# Patient Record
Sex: Female | Born: 1935 | Race: White | Hispanic: No | Smoking: Former smoker
Health system: Southern US, Community
[De-identification: ages and names within clinical notes are randomized; demographics above are authoritative.]

## PROBLEM LIST (undated history)

## (undated) ENCOUNTER — Ambulatory Visit: Admission: EM | Payer: Medicare Other | Source: Home / Self Care

## (undated) DIAGNOSIS — I10 Essential (primary) hypertension: Secondary | ICD-10-CM

## (undated) DIAGNOSIS — E079 Disorder of thyroid, unspecified: Secondary | ICD-10-CM

## (undated) DIAGNOSIS — IMO0001 Reserved for inherently not codable concepts without codable children: Secondary | ICD-10-CM

## (undated) DIAGNOSIS — E039 Hypothyroidism, unspecified: Secondary | ICD-10-CM

## (undated) DIAGNOSIS — K219 Gastro-esophageal reflux disease without esophagitis: Secondary | ICD-10-CM

## (undated) DIAGNOSIS — I480 Paroxysmal atrial fibrillation: Secondary | ICD-10-CM

## (undated) DIAGNOSIS — Z8679 Personal history of other diseases of the circulatory system: Secondary | ICD-10-CM

## (undated) DIAGNOSIS — I2510 Atherosclerotic heart disease of native coronary artery without angina pectoris: Secondary | ICD-10-CM

## (undated) DIAGNOSIS — I509 Heart failure, unspecified: Secondary | ICD-10-CM

## (undated) DIAGNOSIS — I429 Cardiomyopathy, unspecified: Secondary | ICD-10-CM

## (undated) HISTORY — PX: PACEMAKER PLACEMENT: SHX43

## (undated) HISTORY — PX: CHOLECYSTECTOMY: SHX55

## (undated) HISTORY — PX: KNEE ARTHROSCOPY: SUR90

## (undated) HISTORY — PX: APPENDECTOMY: SHX54

---

## 2000-08-15 ENCOUNTER — Other Ambulatory Visit: Admission: RE | Admit: 2000-08-15 | Discharge: 2000-08-15 | Payer: Self-pay | Admitting: *Deleted

## 2000-08-15 ENCOUNTER — Ambulatory Visit (HOSPITAL_COMMUNITY): Admission: RE | Admit: 2000-08-15 | Discharge: 2000-08-15 | Payer: Self-pay | Admitting: *Deleted

## 2000-08-15 ENCOUNTER — Encounter: Payer: Self-pay | Admitting: *Deleted

## 2000-11-20 ENCOUNTER — Ambulatory Visit (HOSPITAL_COMMUNITY): Admission: RE | Admit: 2000-11-20 | Discharge: 2000-11-20 | Payer: Self-pay | Admitting: Family Medicine

## 2000-11-20 ENCOUNTER — Encounter: Payer: Self-pay | Admitting: Family Medicine

## 2001-05-22 ENCOUNTER — Ambulatory Visit (HOSPITAL_COMMUNITY): Admission: RE | Admit: 2001-05-22 | Discharge: 2001-05-22 | Payer: Self-pay | Admitting: Family Medicine

## 2001-05-22 ENCOUNTER — Encounter: Payer: Self-pay | Admitting: Family Medicine

## 2001-09-19 ENCOUNTER — Encounter: Payer: Self-pay | Admitting: Family Medicine

## 2001-09-19 ENCOUNTER — Ambulatory Visit (HOSPITAL_COMMUNITY): Admission: RE | Admit: 2001-09-19 | Discharge: 2001-09-19 | Payer: Self-pay | Admitting: Family Medicine

## 2001-11-22 ENCOUNTER — Encounter: Payer: Self-pay | Admitting: *Deleted

## 2001-11-22 ENCOUNTER — Inpatient Hospital Stay (HOSPITAL_COMMUNITY): Admission: AD | Admit: 2001-11-22 | Discharge: 2001-11-24 | Payer: Self-pay | Admitting: *Deleted

## 2001-11-24 ENCOUNTER — Encounter: Payer: Self-pay | Admitting: *Deleted

## 2001-12-17 ENCOUNTER — Ambulatory Visit (HOSPITAL_COMMUNITY): Admission: RE | Admit: 2001-12-17 | Discharge: 2001-12-17 | Payer: Self-pay | Admitting: Family Medicine

## 2001-12-17 ENCOUNTER — Encounter: Payer: Self-pay | Admitting: Family Medicine

## 2002-02-12 ENCOUNTER — Ambulatory Visit (HOSPITAL_COMMUNITY): Admission: RE | Admit: 2002-02-12 | Discharge: 2002-02-12 | Payer: Self-pay | Admitting: General Surgery

## 2002-06-07 ENCOUNTER — Encounter: Payer: Self-pay | Admitting: Family Medicine

## 2002-06-07 ENCOUNTER — Ambulatory Visit (HOSPITAL_COMMUNITY): Admission: RE | Admit: 2002-06-07 | Discharge: 2002-06-07 | Payer: Self-pay | Admitting: Family Medicine

## 2002-06-11 ENCOUNTER — Encounter: Payer: Self-pay | Admitting: Family Medicine

## 2002-06-11 ENCOUNTER — Ambulatory Visit (HOSPITAL_COMMUNITY): Admission: RE | Admit: 2002-06-11 | Discharge: 2002-06-11 | Payer: Self-pay | Admitting: Family Medicine

## 2002-09-10 ENCOUNTER — Encounter: Payer: Self-pay | Admitting: General Surgery

## 2002-09-10 ENCOUNTER — Ambulatory Visit (HOSPITAL_COMMUNITY): Admission: RE | Admit: 2002-09-10 | Discharge: 2002-09-10 | Payer: Self-pay | Admitting: General Surgery

## 2002-12-24 ENCOUNTER — Ambulatory Visit (HOSPITAL_COMMUNITY): Admission: RE | Admit: 2002-12-24 | Discharge: 2002-12-24 | Payer: Self-pay | Admitting: Family Medicine

## 2002-12-24 ENCOUNTER — Encounter: Payer: Self-pay | Admitting: Family Medicine

## 2003-10-07 ENCOUNTER — Ambulatory Visit (HOSPITAL_COMMUNITY): Admission: RE | Admit: 2003-10-07 | Discharge: 2003-10-07 | Payer: Self-pay | Admitting: Family Medicine

## 2003-12-31 ENCOUNTER — Ambulatory Visit (HOSPITAL_COMMUNITY): Admission: RE | Admit: 2003-12-31 | Discharge: 2003-12-31 | Payer: Self-pay | Admitting: Family Medicine

## 2004-01-07 ENCOUNTER — Ambulatory Visit (HOSPITAL_COMMUNITY): Admission: RE | Admit: 2004-01-07 | Discharge: 2004-01-07 | Payer: Self-pay | Admitting: Family Medicine

## 2005-02-08 ENCOUNTER — Ambulatory Visit (HOSPITAL_COMMUNITY): Admission: RE | Admit: 2005-02-08 | Discharge: 2005-02-08 | Payer: Self-pay | Admitting: Family Medicine

## 2005-06-04 ENCOUNTER — Inpatient Hospital Stay (HOSPITAL_COMMUNITY): Admission: EM | Admit: 2005-06-04 | Discharge: 2005-06-11 | Payer: Self-pay | Admitting: Emergency Medicine

## 2005-06-22 ENCOUNTER — Ambulatory Visit (HOSPITAL_COMMUNITY): Admission: RE | Admit: 2005-06-22 | Discharge: 2005-06-22 | Payer: Self-pay | Admitting: Family Medicine

## 2005-07-25 ENCOUNTER — Ambulatory Visit (HOSPITAL_COMMUNITY): Admission: RE | Admit: 2005-07-25 | Discharge: 2005-07-26 | Payer: Self-pay | Admitting: *Deleted

## 2005-07-25 HISTORY — PX: CARDIAC CATHETERIZATION: SHX172

## 2006-02-21 ENCOUNTER — Ambulatory Visit (HOSPITAL_COMMUNITY): Admission: RE | Admit: 2006-02-21 | Discharge: 2006-02-21 | Payer: Self-pay | Admitting: Family Medicine

## 2006-02-28 ENCOUNTER — Ambulatory Visit (HOSPITAL_COMMUNITY): Admission: RE | Admit: 2006-02-28 | Discharge: 2006-02-28 | Payer: Self-pay | Admitting: Family Medicine

## 2006-12-11 ENCOUNTER — Ambulatory Visit (HOSPITAL_COMMUNITY): Admission: RE | Admit: 2006-12-11 | Discharge: 2006-12-11 | Payer: Self-pay | Admitting: Family Medicine

## 2007-04-10 ENCOUNTER — Ambulatory Visit (HOSPITAL_COMMUNITY): Admission: RE | Admit: 2007-04-10 | Discharge: 2007-04-10 | Payer: Self-pay | Admitting: General Surgery

## 2008-11-06 HISTORY — PX: NM MYOVIEW LTD: HXRAD82

## 2008-11-25 ENCOUNTER — Ambulatory Visit (HOSPITAL_COMMUNITY): Admission: RE | Admit: 2008-11-25 | Discharge: 2008-11-25 | Payer: Self-pay | Admitting: Family Medicine

## 2009-10-23 ENCOUNTER — Ambulatory Visit (HOSPITAL_COMMUNITY): Admission: RE | Admit: 2009-10-23 | Discharge: 2009-10-23 | Payer: Self-pay | Admitting: Family Medicine

## 2009-11-12 ENCOUNTER — Ambulatory Visit (HOSPITAL_COMMUNITY): Admission: RE | Admit: 2009-11-12 | Discharge: 2009-11-12 | Payer: Self-pay | Admitting: Cardiovascular Disease

## 2010-06-10 LAB — SURGICAL PCR SCREEN
MRSA, PCR: NEGATIVE
Staphylococcus aureus: NEGATIVE

## 2010-08-10 NOTE — H&P (Signed)
NAME:  [name], [name]                 ACCOUNT NO.:  [id number]   MEDICAL RECORD NO.:  [id number]           PATIENT TYPE:  AMB   LOCATION:  DAY                           FACILITY:  [facility]   PHYSICIAN:  Dalia Heading, M.D.  DATE OF BIRTH:  05-Dec-1924   DATE OF ADMISSION:  DATE OF DISCHARGE:  LH                              HISTORY & PHYSICAL   CHIEF COMPLAINT:  Family history of colon carcinoma.   HISTORY OF PRESENT ILLNESS:  The patient is an 77 year old white female  who is referred for endoscopic evaluation.  She needs a colonoscopy due  to a family history of colon carcinoma.  No abdominal pain, weight loss,  nausea, vomiting, diarrhea, constipation, melena, hematochezia have been  noted. She last had a colonoscopy in 2003.   PAST MEDICAL HISTORY:  1. Coronary artery disease.  2. Hypertension.  3. Hypothyroidism.   PAST SURGICAL HISTORY:  1. Appendectomy.  2. Cholecystectomy.  3. Pacemaker placement.   MEDICATIONS:  Amiodarone, Zestril, Lasix,  digoxin, Vytorin, Toprol,  Synthroid.   ALLERGIES:  No known drug allergies.   REVIEW OF SYSTEMS:  Noncontributory.   PHYSICAL EXAMINATION:  GENERAL:  The patient is a well-developed, well-  nourished white female in no acute distress.  LUNGS:  Clear to auscultation with equal breath sounds bilaterally.  HEART:  Examination reveals regular rate and rhythm without S3, S4,  murmurs.  ABDOMEN:  Soft, nontender, nondistended.  No hepatosplenomegaly or  masses are noted.  RECTAL:  Examination deferred to the procedure.   IMPRESSION:  Family history of colon carcinoma.   PLAN:  The patient is scheduled for a colonoscopy on April 10, 2007.  The risks and benefits of the procedure including bleeding and  perforation were fully explained to the patient who gave informed  consent.      Dalia Heading, M.D.  Electronically Signed     MAJ/MEDQ  D:  03/13/2007  T:  03/14/2007  Job:  [id number]   cc:   [facility]  Fax:  [phone]   Corrie Mckusick, M.D.  Fax: [phone]

## 2010-08-13 NOTE — H&P (Signed)
[name], [name]                 ACCOUNT NO.:  [id number]   MEDICAL RECORD NO.:  [id number]          PATIENT TYPE:  INP   LOCATION:  A225                          FACILITY:  [facility]   PHYSICIAN:  [name], M.D.    DATE OF BIRTH:  05-12-1924   DATE OF ADMISSION:  06/04/2005  DATE OF DISCHARGE:  LH                                HISTORY & PHYSICAL   CHIEF COMPLAINT:  Cough and weakness.   HISTORY OF PRESENT ILLNESS:  This is a 77 year old female who has a history  of high-grade AV block and is status post pacemaker placement in 2003.  She  also has hypothyroidism which is compensated, hypertension, and  hyperlipidemia.  She is status post appendectomy, cholecystectomy, and knee  surgery. A very remote history of blood clots, history vague.   The patient has maintained a very active life style.  She was on a trip to  [place] when she developed weakness and cough approximately 3 days prior to  this admission.  She was unable to seek medical care.  She arrived in [place] early this morning and came directly to the hospital for evaluation  of cough and weakness.   In the ER the patient was found to have what appears to be right lower lobe  pneumonia.  She did not have an associated leukocytosis.  She is also find  to be mildly anemic with a hemoglobin of 10.8 and a hematocrit of 32.1.  Differential was shifted to the left.  Chemistries were normal except for  mildly elevated glucose of 140.  Creatinine 1.3 (baseline unknown).   Upon further questioning the patient states that she has experienced some  fever and chills over the past 24-48 hours.  She has had no syncope,  headache, or neurologic deficits; nausea, vomiting, diarrhea, melena,  hematemesis, or hematochezia. She also denies any genitourinary symptoms.   The patient was admitted with apparent pneumonia though it is somewhat  atypical on presentation.   CURRENT MEDICATIONS:  1.  Toprol XL 100 mg daily.  2.   Amiodarone 100 mg daily.  3.  Clarinex p.r.n.  4.  Synthroid 125 mcg daily.  5.  Vytorin 10/20 daily.   ALLERGIES:  MACRODANTIN and SULFA.   PAST HISTORY:  As noted above.   FAMILY HISTORY:  Significant for colon cancer in 2 sisters.   SOCIAL HISTORY:  Nonsmoker, nondrinker.   REVIEW OF SYSTEMS:  Negative except as mentioned.   PHYSICAL EXAMINATION:  GENERAL:  This is a very pleasant, female who is  somewhat dyspneic at rest.  VITAL SIGNS:  At presentation, temperature 101.1 degrees, pulse 103,  respirations 24.  O2 saturation 93% on 2 liters.  HEENT:  Normocephalic, atraumatic.  Pupils are equal.  She is mildly  dehydrated.  There is no scleral icterus.  Ears, nose, and throat are  benign.  NECK:  Supple without bruits, thyromegaly, or lymphadenopathy.  LUNGS:  Reveal scattered rales and rhonchi particularly in the right base.  There is also some mild expiratory wheezing present.  HEART:  Rhythm is regular though  she is mildly tachycardic with a rate of  103.  ABDOMEN:  Nontender, nondistended. There are no masses or organomegaly  noted.  EXTREMITIES:  No clubbing or cyanosis noted.  Homan's sign is negative  bilaterally.  Peripheral pulses are intact.  NEUROLOGIC EXAM:  Nonfocal.   ASSESSMENT:  Apparent pneumonia though it is somewhat atypical.  She is  lacking leukocytosis for significant sputum production. She does have a  history of thrombotic phenomenon in the distant past and has recently been  traveling which puts her at risk for venous thrombosis. She also has anemia  which is mild currently.  She has been screened with coloscopy in 2003.   Of note, there is a family history of gastric carcinoma as well.   PLAN:  Admit for broad spectrum antibiotics, pulmonary toilet, D-dimer and  CT scanning, if elevated. We will follow and treat expectantly.  Consider GI  consult pending her progress.      [name], M.D.  Electronically Signed     MC/MEDQ  D:   06/05/2005  T:  06/06/2005  Job:  [id number]

## 2010-08-13 NOTE — Cardiovascular Report (Signed)
NAME:  [name], [name]                           ACCOUNT NO.:  [id number]   MEDICAL RECORD NO.:  [id number]                   PATIENT TYPE:  INP   LOCATION:  2910                                 FACILITY:  [facility]   PHYSICIAN:  Darlin Priestly, M.D.             DATE OF BIRTH:  1924-10-09   DATE OF PROCEDURE:  11/23/2001  DATE OF DISCHARGE:  11/24/2001                              CARDIAC CATHETERIZATION   PROCEDURE:  1. Left heart catheterization.  2. Coronary angiography.  3. Left ventriculogram.   ATTENDING FOR PROCEDURE:  Darlin Priestly, M.D.   COMPLICATIONS:  None.   INDICATIONS FOR PROCEDURE:  The patient is a 77 year old female patient of  Dr. Assunta Found in [place], initially referred for a treadmill  Cardiolite secondary to chest pain.  During the stress portion of her stress  test, the patient developed second-degree Type II AV block and her stress  test was subsequently discontinued.  She was subsequently admitted for rule  out MI and cardiac catheterization with probable permanent pacemaker  placement.   DESCRIPTION OF PROCEDURE:  After giving informed written consent, the  patient was brought to the cardiac catheterization where her right and left  groin were shaved, prepped, and draped in the usual sterile fashion.  ECG  monitoring was established.  Using the modified Seldinger technique, a #6  French arterial sheath was inserted in the right femoral artery.  The #6  [place] diagnostic catheters were then used to perform diagnostic  angiography.   FINDINGS:  1. This reveals a large left main with no significant disease.  2. The LAD is a medium-sized vessel which coursed to the apex.  There were     two small diagonal branches.  The LAD is noted to be calcified in its     proximal segment with 40% proximal disease.  There is a 60% mid LAD     stenotic lesion with course irregularities throughout the mid and distal     LAD.  The first and second diagonal  branches are small vessels that were     irregular but no significant disease.  3. The AV groove circumflex is a medium-sized vessel which coursed in the AV     groove and gave rise to one large obtuse marginal branch.  The AV groove     circumflex is noted to have a 40% proximal lesion.  The first OM is a     large vessel which bifurcates in the mid segment and has sequential 50%     and 40% proximal to mid vessel lesions.  4. The right coronary artery is a medium-sized vessel which is dominant and     gives rise to a PDA as well as a posterolateral branch.  The RCA is noted     to have a 60% long proximal, 50% mid, and 50% distal stenotic lesions.     The  PDA and posterolateral branch have no significant disease.  5. Left ventriculogram reveals a __________ EF of 50% with bradycardia     noted.   HEMODYNAMICS:  1. System arterial pressure 160/50.  2. LV systemic pressure 160/18, LVEDP of 22.    CONCLUSION:  1. Noncritical coronary artery disease.  2. Normal left ventricular systolic function.                                               Darlin Priestly, M.D.    RHM/MEDQ  D:  11/23/2001  T:  11/25/2001  Job:  [id number]   cc:   Corrie Mckusick, M.D.  [address] [place]. [place]  [place]  [place] [address]  Fax: [phone]

## 2010-08-13 NOTE — Discharge Summary (Signed)
NAME:  [name], [name] NO.:  [id number]   MEDICAL RECORD NO.:  [id number]          PATIENT TYPE:  OIB   LOCATION:  3731                         FACILITY:  [facility]   PHYSICIAN:  Delman Cheadle, MD       DATE OF BIRTH:  [birth year]   DATE OF ADMISSION:  07/25/2005  DATE OF DISCHARGE:  07/26/2005                                 DISCHARGE SUMMARY   Ms. [name] is a 77 year old female patient of Dr. Martyn Malay who  has a history of diffuse non critical coronary disease and normal EF. She  apparently recently had been hospitalized at [facility]. She also has known  Paroxysmal atrial fibrillation with sick sinus syndrome and underwent a  pacemaker implantation on November 23, 2001, also the initiation of  amiodarone. She has been maintaining sinus rhythm. She apparently had been  admitted June 06, 2005 at [facility] for progressive shortness of breath.  She had a 2D echocardiogram and her ejection fraction was found to be 25%.  She did have a small troponin leak, with normal CK-MB. She was seen by Dr.  Jenne Campus in the office on July 19, 2005. The pacemaker was interrogated, she  did have some episodes of atrial fibrillation with rapid ventricular  response. Her Toprol was increased to 75 mg every day. It was decided that  she should undergo cardiac catheterization to see if she had progressive  coronary disease causing her EF to be decreased. This was performed on July 25, 2005 by Dr. Lenise Herald. She was found to have diffuse coronary  disease and medical therapy was recommended. No real high-grade stenosis for  intervention. Her EF was 30-35%. Cardiac output was 2.8. Cardiac index was  1.7 PA. Pressures were 31/12, her wedge was 12. PA saturation was 59%.  On  the morning of Jul 26, 2005 she was seen by Dr. Nicki Guadalajara and considered  stable for discharge home. Dr. Jenne Campus had ordered digoxin. The patient had  told me this had been stopped recently by Dr. Renette Butters  because of elevated  level. However, the patient states that she did take her digoxin the morning  that she had her blood drawn.   DISCHARGE MEDICATIONS:  1.  Lisinopril 20 mg daily.  2.  Levothyroxine 150 mcg daily.  3.  Toprol XL 75 mg daily.  4.  Vytorin 10/20 mg daily.  5.  Lasix 40 mg daily.  6.  Amiodarone 200 mg daily.  7.  Aspirin 81 mg daily.  8.  Claritin p.r.n.  9.  Digoxin 0.125 mg daily.  10. Nexium 40 mg daily.  11. Multivitamin one daily.   FOLLOW UP:  She will follow up with Dr. Jenne Campus next Monday as already  planned. She will have a blood drawn on Monday after seeing Dr. Jenne Campus for  her BMET and a digoxin level. She should not take digoxin the morning of  having her blood drawn.   DISCHARGE DIAGNOSES:  1.  Recent onset of shortness of breath.  2.  Non ischemic cardiomyopathy with an ejection fraction now of  30-35% by      cardiac catheterization.  3.  Coronary artery disease, diffuse. There is no high-grade stenosis      however she has coronary disease in three of her major vessels.  4.  Hyperlipidemia.  5.  Hypertension.  6.  Proximal atrial fibrillation on amiodarone and aspirin, recently Toprol      increased secondary to intermittent paroxysmal atrial fibrillation by      pacer interrogation on July 19, 2005 by Dr. Lenise Herald.      Lezlie Octave, N.P.    ______________________________  Delman Cheadle, MD    BB/MEDQ  D:  07/26/2005  T:  07/26/2005  Job:  [id number]   cc:   Dr. Renette Butters

## 2010-08-13 NOTE — Discharge Summary (Signed)
NAME:  [name], [name]                 ACCOUNT NO.:  [id number]   MEDICAL RECORD NO.:  [id number]          PATIENT TYPE:  INP   LOCATION:  A219                          FACILITY:  [facility]   PHYSICIAN:  Patrica Duel, M.D.    DATE OF BIRTH:  06/10/1924   DATE OF ADMISSION:  DATE OF DISCHARGE:  LH                                 DISCHARGE SUMMARY   DISCHARGE DIAGNOSES:  1.  Acute pneumonia with eventually good response to therapy.  2.  Question acute myocardial infarction with slight bump in enzymes.  3.  History of high-grade atrioventricular block, status post pacemaker      placement 2003.  4.  Hypothyroidism (compensated).  5.  Hypertension.  6.  Hyperlipidemia.  7.  Remote history of venous thrombosis (history vague).   PAST SURGICAL HISTORY:  1.  Appendectomy.  2.  Cholecystectomy.  3.  Knee surgery.   DETAILS REGARDING ADMISSION:  Please refer to the admitting note.  Briefly,  this is a 77 year old female with the above-history, was on a trip to [place]  when she developed weakness and cough approximately three days prior to the  admission.  She arrived in [place] early the morning of admission and  came directly to the hospital for evaluation.  She was found to have a right  lower lobe pneumonia and associated leukocytosis.  Hemoglobin 9.8,  hematocrit was 32.1.  Chemistries were normal except for a mildly elevated  glucose 140, creatinine 1.3.   The patient was admitted with apparent pneumonia.   COURSE IN THE HOSPITAL:  The patient underwent CT scanning, which confirmed  the presence of atelectasis and pneumonia.  She remained mildly hypoxic and  dyspneic.  She was transferred to the unit for intensive pulmonary toilet  and ongoing care.  [facility] was consulted after her troponin  increased slightly.  She did not have EKG changes.  The possibility of acute  MI was entertained, and she was put on heparin briefly.   The patient improved over the next 48  hours and was transferred to the floor  for ongoing management.  She continued to do well and was stable for  discharge on the eighth hospital day.   DISPOSITION:   MEDICATIONS:  1.  Amiodarone 100 mg daily.  2.  Synthroid 125 mg daily.  3.  Vytorin 10/20 mg daily.  4.  Clarinex p.r.n.  5.  Toprol-XL 50 mg daily.  6.  Norvasc 5 mg daily.  7.  Lanoxin 0.125 mg daily.  8.  Lasix 40 mg daily.  9.  Xopenex HFA two puffs t.i.d.  10. She is also being continued on Levaquin 500 mg daily.   We will follow her in three weeks as an outpatient.      Patrica Duel, M.D.  Electronically Signed     MC/MEDQ  D:  07/16/2005  T:  07/18/2005  Job:  [id number]

## 2010-08-13 NOTE — Cardiovascular Report (Signed)
NAME:  [name], [name]                 ACCOUNT NO.:  [id number]   MEDICAL RECORD NO.:  [id number]          PATIENT TYPE:  OIB   LOCATION:  3731                         FACILITY:  [facility]   PHYSICIAN:  Darlin Priestly, MD  DATE OF BIRTH:  06-06-1924   DATE OF PROCEDURE:  07/25/2005  DATE OF DISCHARGE:                              CARDIAC CATHETERIZATION   PROCEDURE:  1.  Right heart catheterization.  2.  Left heart catheterization.  3.  Coronary angiography.  4.  Left ventriculogram.   ATTENDING:  Dr. Lenise Herald   COMPLICATIONS:  None.   INDICATIONS:  [name] is a 77 year old female patient of Dr. Dorthey Sawyer  with a history of cardiac catheterization in 2003 with diffuse noncritical  CAD with normal EF.  She has a history of PAF with sick sinus syndrome and  ultimately underwent dual-chamber pacer implant on November 23, 2001, with  initiation of amiodarone at that time.  She has had some brief episodes of  atrial fibrillation but overall has maintained sinus rhythm.  She recently  complained of increasing shortness of breath with repeat echocardiogram  revealing EF now 25%.  She is now for repeat catheterization to reassess her  CAD.   DESCRIPTION OF PROCEDURE:  After obtaining informed consent the patient was  brought to the cardiac catheterization laboratory.  The right groin was  shaved, prepped and draped in the usual sterile fashion.  ECG monitoring was  established.  Using a modified Seldinger technique a #7 [place] venous sheath  was inserted in the right femoral vein and a #6 [place] arterial sheath was  inserted in the right femoral artery.  Next, under fluoroscopic guidance a  #7 Kennieth Rad catheter was inflated to the RA, RVP, and wedge position  and then hemodynamic measures were obtained.  A 6 French diagnostic catheter  was used to perform diagnostic angiography.   The left main is a large vessel with no significant disease.   The LAD is a medium-size  vessel which coursed the apex, gave rise to four  small diagonal branches.  The LAD was noted to have proximal and mid  calcification with 40% proximal and 40% mid LAD stenosis.  The remainder of  the LAD is irregular but has no higher grade stenosis.   All four diagonal branches are small vessels with scattered 60-70% lesions  throughout the second and third diagonals.   The left circumflex is a medium-size vessel which courses the AV groove and  gives rise to obtuse marginal branches.  The circumflex is calcified  throughout the mid portion.  There is mild 30% proximal and 50% mid vessel  AV groove lesions.   The first OM is a medium-size vessel which is tortuous and bifurcates  distally.  There is 40% mid vessel narrowing.   The second OM is a small vessel with no significant disease.   The right coronary artery is a medium-size vessel which is dominant, gives  rise to both PDA as well as posterolateral branch.  There is calcification  noted in the proximal portion of the  RCA.  There is 40% proximal and 40% mid  vessel stenosis.  The PDA and posterolateral branch have no significant  disease.   Left ventriculogram reveals moderate to severely depressed EF of 30-35% with  global hypokinesis.   HEMODYNAMICS:  Right atrial pressure is 6, RV 28/4, PA 31/12, pulmonary cap  wedge pressure is 12, systemic arterial pressure 146/58, LV systolic  pressure 150/13, LVEDP of 20.  Cardiac output 2.8, cardiac index 1.7, PA  saturation 59%, AO saturation 94%.   CONCLUSIONS:  1.  Scattered noncritical coronary artery disease involving small diagonal      branches.  2.  Moderate to severely depressed left ventricular systolic function.  3.  Normal right heart pressures.  4.  Cardiac output 2.8, cardiac index 1.7.  5.  PA saturation 59%, AO saturation 94%.      Darlin Priestly, MD  Electronically Signed     RHM/MEDQ  D:  07/25/2005  T:  07/26/2005  Job:  [id number]   cc:   Corrie Mckusick, M.D.  Fax: [phone]

## 2010-08-13 NOTE — Procedures (Signed)
NAME:  [name], [name]                 ACCOUNT NO.:  [id number]   MEDICAL RECORD NO.:  [id number]          PATIENT TYPE:  INP   LOCATION:  IC08                          FACILITY:  [facility]   PHYSICIAN:  Darlin Priestly, MD  DATE OF BIRTH:  May 16, 1924   DATE OF PROCEDURE:  06/07/2005  DATE OF DISCHARGE:                                  ECHOCARDIOGRAM   INDICATIONS:  Ms. Asencio is a 77 year old female patient of Dr. Nobie Putnam with  a history of atrial fibrillation, hypertension and acute onset of shortness  of breath. She is now referred for a two-day echocardiogram to evaluate LV  function.   This is a technically suboptimal study secondary to poor acoustic windows.   The aorta is within normal limits at 3.0 cm.   Left atrium is within normal limits at 3.0 cm. There were no clots seen.   IVS and LVPW are mildly thickened at 1.2 and 1.3 cm, respectively.   The aortic valve is not well visualized. However, it appears to be at least  moderately calcified. There is leaflet motion visible. By Doppler  interrogation, there does not appear to be any significant aortic stenosis  present.   The mitral valve leaflets were mildly thickened with mild mitral annular  calcification. There is no mitral valve prolapse noted. There is at least  moderate mitral regurgitation.   Tricuspid valve was not well visualized.   Left ventricular internal dimensions were upper limits of normal at 4.8 and  4.0 cm, respectively. There is moderate to severe depressed LV systolic  function, estimated EF of approximately 20 to 25% with global hypokinesis.  There is septic dyskinesis secondary to pacer artifact. Function is best  preserved in the posterolateral distribution.   Grossly normal RV size and systolic function.   CONCLUSION:  1.  This is a technically suboptimal study secondary to poor acoustic      windows.  2.  Mildly dilated left ventricle with moderate to severely depressed left      ventricular  systolic function, estimated ejection fraction of 20 to 25%.      There is global hypokinesis with septal dyskinesis secondary to pacer      artifact. Function is best preserved in the posterolateral distribution.  3.  Moderately thickened aortic valve with no evidence of significant aortic      stenosis.  4.  Mildly thickened mitral valve leaflets with moderate to mitral      regurgitation.  5.  Tricuspid valve poorly visualized.  6.  Grossly normal right ventricular size and systolic function.      Darlin Priestly, MD  Electronically Signed     RHM/MEDQ  D:  06/07/2005  T:  06/08/2005  Job:  [id number]

## 2010-08-13 NOTE — H&P (Signed)
   NAME:  [name], [name]                           ACCOUNT NO.:  [id number]   MEDICAL RECORD NO.:  [id number]                   PATIENT TYPE:   LOCATION:                                       FACILITY:  [facility]   PHYSICIAN:  Dalia Heading, M.D.               DATE OF BIRTH:  1925-01-13   DATE OF ADMISSION:  02/12/2002  DATE OF DISCHARGE:                                HISTORY & PHYSICAL   CHIEF COMPLAINT:  Family history of colon carcinoma.   HISTORY OF PRESENT ILLNESS:  The patient is a 77 year old white female who  is referred for a screening colonoscopy.  She denies any abdominal  complaints.  She has never had a colonoscopy.  She does have a sister with a  history of colon carcinoma.   PAST MEDICAL HISTORY:  1. Sick sinus syndrome requiring pacemaker placement recently.  2. Hypertension.  3. High cholesterol levels.  4. Hypothyroidism.   PAST SURGICAL HISTORY:  1. Appendectomy.  2. Cholecystectomy.  3. Knee surgery.  4. Pacemaker placement.   CURRENT MEDICATIONS:  Zestril, Toprol-XL, Lipitor, and Synthroid.   ALLERGIES:  SULFA and MACRODANTIN.   REVIEW OF SYSTEMS:  Unremarkable.   PHYSICAL EXAMINATION:  GENERAL:  The patient is a well-developed and well-  nourished white female in no acute distress.  VITAL SIGNS:  She is afebrile and vital signs are stable.  LUNGS:  Clear to auscultation with equal breath sounds bilaterally.  HEART:  Reveals a regular rate and rhythm without S3, S4, or murmurs.  ABDOMEN:  Soft, nontender, nondistended.  No hepatosplenomegaly or masses  are noted.  RECTAL:  Deferred to the procedure.   IMPRESSION:  Family history of colon carcinoma.    PLAN:  The patient is scheduled for a colonoscopy on February 12, 2002.  The  risks and benefits of the procedure including bleeding and perforation were  fully explained to the patient, who gave informed consent.  IV antibiotic  prophylaxis will be given due to her pacemaker placement.                                       Dalia Heading, M.D.    MAJ/MEDQ  D:  01/31/2002  T:  01/31/2002  Job:  [id number]   cc:   Corrie Mckusick, M.D.  [address] [place]. [place]  [place]  [place] [address]  Fax: [phone]

## 2010-08-13 NOTE — Discharge Summary (Signed)
NAME:  [name], [name]                           ACCOUNT NO.:  [id number]   MEDICAL RECORD NO.:  [id number]                   PATIENT TYPE:  INP   LOCATION:  2910                                 FACILITY:  [facility]   PHYSICIAN:  Darlin Priestly, M.D.             DATE OF BIRTH:  Aug 12, 1924   DATE OF ADMISSION:  11/22/2001  DATE OF DISCHARGE:  11/24/2001                                 DISCHARGE SUMMARY   DISCHARGE DIAGNOSES:  1. Mobitz 2 and complete heart block.     a. Status post PDD pacemaker.  2. Noncritical coronary disease with 40% to 60% diffuse disease in all     coronary arteries.  3. Hypertension.  4. Hypothyroidism.  5. Dyslipidemia.  6. History of pulmonary embolus.  7. Abnormal Cardiolite study.  8. Chest pain resolved.   CONDITION ON DISCHARGE:  Improved.   PROCEDURES:  1. On November 23, 2001, combined left heart catheterization by Dr. Lenise Herald.  2. On November 23, 2001, insertion of dual chamber Medtronic Kappa KDR [id number]     pacemaker with  passive atrial and ventricular Medtronic leads.   DISCHARGE MEDICATIONS:  1. Prilosec 20 mg 1 q.d.  2. Evista 60 mg q.d.  3. Synthroid 100 mcg q.d.  4. Zestril 20 mg  q.d.  5. Os-Cal 500 plus D 1 q.d.  6. Hydrochlorothiazide 25 mg q.d.  7. Toprol XL 25, 1 q.d.  8. Lipitor 10 mg 1 q.h.s.  9. Centrum vitamin and vitamin E as before.  10.      Papaverine as before.  11.      Coated aspirin 81 mg q.d.   DISCHARGE INSTRUCTIONS:  1. Take extra strength Tylenol if needed for pain at pacemaker site.  2. Do not raise left arm over head for one week, no lifting over 5 pounds     with left arm, no driving for one week.  3. Low fat, low salt diet.  4. Keep wound site dry for one week. The Steri-Strips should fall off on     their own. Call with any bleeding or swelling at the pacer site. In     addition the right groin catheter site, call if any bleeding, swelling or     drainage.  5. Followup with Dr. Jenne Campus in two to  three weeks. The office will call     with an appointment.  6. Followup with one of  Dr. Mikey Bussing assistants the first week of     September.  The office will call you with date and time.   HISTORY OF PRESENT ILLNESS:  The patient is a 77 year old white widowed  female who presented for a nuclear stress test on November 22, 2001, when she  developed Mobitz type 2 heart block and was sent up to see Dr. Jenne Campus for  cardiac evaluation. She had previously not had any cardiac history  until she  had expressed to her primary care physician, Dr. Phillips Odor, that she had had  some chest pain described as an achiness. It was always with exertion, never  at rest, and he had ordered a Cardiolite study.  She also had noticed  increased dyspnea on exertion for the last several months, and she got like  she was panting for breath, going up a slight incline.   PAST MEDICAL HISTORY:  1. Hypertension.  2. Hypothyroidism.  3. History of pulmonary embolus 20  years ago.  4. Status post cholecystectomy.  5. Status post appendectomy.  6. Status post history of arthroscopy of the knee.   OUTPATIENT MEDICATIONS:  1. Zestril 10 q.d.  2. Prilosec 20 q.d.  3. Provera 150 q.d.  4. Synthroid 0.1 mg q.d.  5. Evista 60 q.d.  6. Centrum q.d.  7. Vitamin E and calcium plus D daily.   ALLERGIES:  SULFA AND MACRODANTIN   SOCIAL HISTORY:  Widowed and lives alone.   FAMILY HISTORY:  No premature coronary disease, but she does have family  with coronary disease.   REVIEW OF SYSTEMS:  See history and physical.   PHYSICAL EXAMINATION:  On discharge.  VITAL SIGNS:  Blood pressure 142/58, pulse 92, respirations 18, temperature  97.4, room air O2 saturation 94%.  GENERAL:  An alert, oriented white female in no acute distress.  HEART:  S1, S2, regular rate and rhythm.  LUNGS:  Clear.  CHEST:  Chest wall incision site well approximated. Some bruising but no  hematoma.  ABDOMEN:  Soft, nontender, positive bowel  sounds.  EXTREMITIES:  Without edema.   LABORATORY DATA:  Hemoglobin 11.8, hematocrit 35.8, WBCs 8.8, MCV 92.8,  platelets 220.  Protime 12.8, INR 0.9, PTT 28 on heparin, which was  therapeutic. Chemistry:  Sodium 138, potassium 3.9, chloride 106, CO2 29,  glucose 103, BUN 19, creatinine 0.9, calcium 11.4. CK 82, MB 2.1, troponin  0.02.  All of these  remained less than  100, MB flow and troponin 0.03 at  the peak.  Cholesterol 216, triglycerides 123, HDL 74, LDL 117. T4 10.9, T3  uptake 30.3, TSH 1.53.   The EKGs from the hospital show Mobitz  type 2 block. After admission on  rhythm strips, she continued with type 2 block and then would have long runs  of 2:1 block with heart rates dropping down to the 30s.  At times on her  rhythm strip, she does develop complete heart block with a P wave going into  the QRS.   Cardiac catheterization results:  Diffuse disease of the right coronary  artery with 50% to 60% stenosis, ostial, mid and distal. The left circumflex  and the LAD range from 60% to 40% stenosis. The EF was 50%.   HOSPITAL COURSE:  The patient was admitted originally to a telemetry bed on  4700, after being found to be in Mobitz type 2 block in the office and with  evaluation for chest pain. She was admitted with unstable angina and a  Mobitz 2 heart block. She was stable, although hypertensive. Her medications  were adjusted, and then later on on the day of admission, she developed  slowing of her heart rate, and she was transferred to the transitional care  unit.   She underwent cardiac catheterization on November 23, 2001, and was found to  have diffuse disease. Medical therapy was recommended. The patient also  received a dual chamber pacemaker on November 23, 2001, by Dr. Jenne Campus.  She  tolerated the procedure well.   By the morning of November 24, 2001, she felt a little dizzy when she got up to go have her PA and lateral chest x-ray done, but later that afternoon by  3  o'clock, she was found able to ambulate without any light headedness or  dizziness. The PA and  lateral chest x-ray, the report is not in the chart,  but the called note on November 24, 2001, pacer in place, no pneumothorax.  The patient was discharged on November 24, 2001, and will followup with Dr.  Jenne Campus.     Darcella Gasman. Valarie Merino                     Darlin Priestly, M.D.    LRI/MEDQ  D:  12/10/2001  T:  12/11/2001  Job:  [id number]   cc:   Corrie Mckusick, M.D.  [address] [place]ure[place] Josephs. [address]  [place]  [place] [address]  Fax: [phone]

## 2010-08-13 NOTE — Cardiovascular Report (Signed)
NAME:  [name], [name]                           ACCOUNT NO.:  [id number]   MEDICAL RECORD NO.:  [id number]                   PATIENT TYPE:  INP   LOCATION:  2910                                 FACILITY:  [facility]   PHYSICIAN:  Darlin Priestly, M.D.             DATE OF BIRTH:  [birth year]   DATE OF PROCEDURE:  11/23/2001  DATE OF DISCHARGE:  11/24/2001                              CARDIAC CATHETERIZATION   PROCEDURE:  Placement of dual chamber Medtronic pacemaker, with passive  atrial and ventricular leads.   COMPLICATIONS:  None.   INDICATIONS:  The patient is a 77 year old female, patient of Dr. Geanie Cooley in [place], referred to our office for chest pain and shortness  of breath.  During her treadmill and Cardiolite the patient developed  secondary type 2 AV block and subsequently had her stress test discontinued.  Because of her chest pain, she was subsequently admitted.  She underwent  cardiac catheterization this morning, revealing noncritical disease of her  LAD and RCA, with an EF of approximately 50%.  She is now brought for a dual-  chamber pacemaker placement, secondary to continued second-degree type 2 AV  block.   DESCRIPTION OF PROCEDURE:  After getting informed written consent, the  patient brought to the cardiac catheterization lab -- where left chest  prepped and draped in sterile fashion.  EC line was established.  Lidocaine  1% was then used to anesthetize the left subclavian region approximately 2  cm beneath the left clavicle.  An approximately 3.0-3.5 cm incision was then  performed in horizontal fashion beneath the left clavicle.  Blunt dissection  was then  performed down to the pectoral fascia, and hemostasis obtained  with electrocautery.  An approximately 3 x 4 cm pocket was then created over  the left pectoral fascia.  Hemostasis was obtained.  Next, a single stick  was then used to obtain access into the left subclavian vein, with 1%  lidocaine.  A  guide wire was then easily advanced into the SVC and right  atrium.  Next a #9 [place] dilator and sheath were then advanced over the  guide wire and the guide wire and dilator were then removed.  Next, a 52 cm  Medtronic passive lead (Model [id number], Serial No. [id number]) was then easily  passed into the right atrium.  The guide wire was then reinserted through  the sheath and the sheath was then peeled away.  A second 9-French dilator  sheath was then inserted over the previously placed guide wire.  The dilator  and wire were then removed and a second Medtronic 45 cm passive lead (Model  No 5594, Serial No. [id number]) was then easily passed into the right  atrium.  The guide wire was again retained and the sheath was then pulled  away.  The guide wire was anchored to the sheath with a mosquito hemostat.  A J curve  was then placed on the ventricular lead.  A stylet was then  reinserted in the lead.  The lead was then advanced into the RV outflow  tract.  A straight stylet was then inserted into the mid portion of the  lead, and the lead was then pulled back and allowed to prolapse into the RV  apex.  Thresholds were then determined.  R-waves were measured at 15.2 mV.  Ventricular lead impedance is 851 ohms.  Threshold was 0.4 V at 0.5 msec.  Current was  0.7 milliamps.  There was no evidence of diaphragmatic pacing at 10 V.   Next, the atrial lead was positioned in the right atrial appendage.  Thresholds were then determined.  P-waves were sensed at 6.1 mV.  Impedance  is 615 ohms.  Thresholds of 0.4 V at 0.5 msec.  Current is  0.7 milliamps.  There is no evidence of diaphragmatic stimulation at 10 V.   The retaining guide wire was then removed.  The leads were then anchored to  the pectoral fascia with  four 2-0 silk sutures.  Next, the pocket was copiously irrigated with 1%  kanamycin solution.  Hemostasis was confirmed.  The ventricular and atrial  leads were then connected in serial  fashion to the Medtronic Kappa KDR 801  (Serial No. [id number]) generator, and the head screws were then tightened.  Pacing was confirmed.  A single anchoring silk suture was then placed at the  apex of the pocket.  The generator and wires were then easily delivered into  the pocket.  The pacemaker was then anchored into place.  The fascia was  then closed using running 2-0 Dexon.  The skin layer was then closed using  running 5-0 Dexon.  Steri-Strips then applied.  Sterile dressing was applied  and the patient was transferred to the recovery room in stable condition.   CONCLUSION:  Successful placement of a Medtronic Kappa KDR 801 generator,  with passive atrial and ventricular Medtronic leads.                                               Darlin Priestly, M.D.    RHM/MEDQ  D:  11/23/2001  T:  11/25/2001  Job:  [id number]   cc:   Corrie Mckusick, M.D.  [address] [place]s. [place]  [place]  [place] [address]  Fax: [phone]

## 2011-04-01 DIAGNOSIS — E039 Hypothyroidism, unspecified: Secondary | ICD-10-CM | POA: Diagnosis not present

## 2011-04-01 DIAGNOSIS — I10 Essential (primary) hypertension: Secondary | ICD-10-CM | POA: Diagnosis not present

## 2011-04-01 DIAGNOSIS — E785 Hyperlipidemia, unspecified: Secondary | ICD-10-CM | POA: Diagnosis not present

## 2011-04-05 DIAGNOSIS — J019 Acute sinusitis, unspecified: Secondary | ICD-10-CM | POA: Diagnosis not present

## 2011-04-05 DIAGNOSIS — IMO0002 Reserved for concepts with insufficient information to code with codable children: Secondary | ICD-10-CM | POA: Diagnosis not present

## 2011-04-05 DIAGNOSIS — I10 Essential (primary) hypertension: Secondary | ICD-10-CM | POA: Diagnosis not present

## 2011-04-12 DIAGNOSIS — Z45018 Encounter for adjustment and management of other part of cardiac pacemaker: Secondary | ICD-10-CM | POA: Diagnosis not present

## 2011-04-12 DIAGNOSIS — I4891 Unspecified atrial fibrillation: Secondary | ICD-10-CM | POA: Diagnosis not present

## 2011-04-12 DIAGNOSIS — I2510 Atherosclerotic heart disease of native coronary artery without angina pectoris: Secondary | ICD-10-CM | POA: Diagnosis not present

## 2011-04-12 DIAGNOSIS — I442 Atrioventricular block, complete: Secondary | ICD-10-CM | POA: Diagnosis not present

## 2011-04-19 DIAGNOSIS — I059 Rheumatic mitral valve disease, unspecified: Secondary | ICD-10-CM | POA: Diagnosis not present

## 2011-04-27 DIAGNOSIS — L820 Inflamed seborrheic keratosis: Secondary | ICD-10-CM | POA: Diagnosis not present

## 2011-04-27 DIAGNOSIS — I781 Nevus, non-neoplastic: Secondary | ICD-10-CM | POA: Diagnosis not present

## 2011-04-27 DIAGNOSIS — D235 Other benign neoplasm of skin of trunk: Secondary | ICD-10-CM | POA: Diagnosis not present

## 2011-04-27 DIAGNOSIS — Z85828 Personal history of other malignant neoplasm of skin: Secondary | ICD-10-CM | POA: Diagnosis not present

## 2011-05-13 DIAGNOSIS — J0110 Acute frontal sinusitis, unspecified: Secondary | ICD-10-CM | POA: Diagnosis not present

## 2011-05-13 DIAGNOSIS — J210 Acute bronchiolitis due to respiratory syncytial virus: Secondary | ICD-10-CM | POA: Diagnosis not present

## 2011-05-13 DIAGNOSIS — J069 Acute upper respiratory infection, unspecified: Secondary | ICD-10-CM | POA: Diagnosis not present

## 2011-05-21 ENCOUNTER — Emergency Department (HOSPITAL_COMMUNITY): Payer: Medicare Other

## 2011-05-21 ENCOUNTER — Encounter (HOSPITAL_COMMUNITY): Payer: Self-pay

## 2011-05-21 ENCOUNTER — Emergency Department (HOSPITAL_COMMUNITY)
Admission: EM | Admit: 2011-05-21 | Discharge: 2011-05-21 | Disposition: A | Discharge status: Home / Self Care | Payer: Medicare Other | Attending: Emergency Medicine | Admitting: Emergency Medicine

## 2011-05-21 DIAGNOSIS — Z79899 Other long term (current) drug therapy: Secondary | ICD-10-CM | POA: Diagnosis not present

## 2011-05-21 DIAGNOSIS — M7730 Calcaneal spur, unspecified foot: Secondary | ICD-10-CM | POA: Insufficient documentation

## 2011-05-21 DIAGNOSIS — E079 Disorder of thyroid, unspecified: Secondary | ICD-10-CM | POA: Diagnosis not present

## 2011-05-21 DIAGNOSIS — I10 Essential (primary) hypertension: Secondary | ICD-10-CM | POA: Diagnosis not present

## 2011-05-21 DIAGNOSIS — M79609 Pain in unspecified limb: Secondary | ICD-10-CM | POA: Diagnosis not present

## 2011-05-21 HISTORY — DX: Disorder of thyroid, unspecified: E07.9

## 2011-05-21 HISTORY — DX: Essential (primary) hypertension: I10

## 2011-05-21 MED ORDER — IBUPROFEN 600 MG PO TABS: 600.0000 mg | ORAL_TABLET | Freq: Three times a day (TID) | ORAL | Status: AC | PRN

## 2011-05-21 NOTE — ED Provider Notes (Signed)
History   This chart was scribed for Nelia Shi, MD by Charolett Bumpers . The patient was seen in room APA17/APA17 and the patient's care was started at 1:30pm.   CSN: [id number]  Arrival date & time 05/21/11  1230   First MD Initiated Contact with Patient 05/21/11 1247      Chief Complaint  Patient presents with  . Foot Pain    (Consider location/radiation/quality/duration/timing/severity/associated sxs/prior treatment) HPI [name] is a 78 y.o. female who presents to the Emergency Department complaining of constant, moderate left foot pain for the past week. Patient states that the foot pain is aggravated by walking on the affected foot, or by applying pressure. Patient denies any recent injuries. Patient reports no prior hx of similar problems. Patient states that she has an appointment with her Podiatrist Dr. Pricilla Holm on March the 7th. No other symptoms reported.     Podiatrist: Dr. Pricilla Holm:    Past Medical History  Diagnosis Date  . Pacemaker   . Thyroid disease   . Hypertension     Past Surgical History  Procedure Date  . Appendectomy   . Cholecystectomy   . Knee surgery     No family history on file.  History  Substance Use Topics  . Smoking status: Never Smoker   . Smokeless tobacco: Not on file  . Alcohol Use: No    OB History    Grav Para Term Preterm Abortions TAB SAB Ect Mult Living                  Review of Systems A complete 10 system review of systems was obtained and is otherwise negative except as noted in the HPI and PMH.   Allergies  Macrodantin and Sulfa antibiotics  Home Medications   Current Outpatient Rx  Name Route Sig Dispense Refill  . ASPIRIN EC 81 MG PO TBEC Oral Take 81 mg by mouth at bedtime.    Marland Kitchen CALCIUM CARBONATE-VITAMIN D 600-400 MG-UNIT PO TABS Oral Take 2 tablets by mouth daily.    Marland Kitchen DIGOXIN 0.125 MG PO TABS Oral Take 125 mcg by mouth daily.    Marland Kitchen ESOMEPRAZOLE MAGNESIUM 40 MG PO CPDR Oral Take 40 mg by  mouth daily before breakfast.    . EZETIMIBE-SIMVASTATIN 10-20 MG PO TABS Oral Take 1 tablet by mouth at bedtime.    . OMEGA-3 FATTY ACIDS 1000 MG PO CAPS Oral Take 1 g by mouth daily.    . FUROSEMIDE 20 MG PO TABS Oral Take 10 mg by mouth daily.    Marland Kitchen LEVOCETIRIZINE DIHYDROCHLORIDE 5 MG PO TABS Oral Take 5 mg by mouth every evening.    Marland Kitchen LEVOFLOXACIN 500 MG PO TABS Oral Take 500 mg by mouth daily.    Marland Kitchen LEVOTHYROXINE SODIUM 75 MCG PO TABS Oral Take 75 mcg by mouth daily.    Marland Kitchen LISINOPRIL 30 MG PO TABS Oral Take 15 mg by mouth daily.    Marland Kitchen MAGNESIUM 250 MG PO TABS Oral Take 1 tablet by mouth daily.    Marland Kitchen METOPROLOL SUCCINATE ER 50 MG PO TB24 Oral Take 50 mg by mouth daily. Take with or immediately following a meal.    . ADULT MULTIVITAMIN W/MINERALS CH Oral Take 1 tablet by mouth daily.    Marland Kitchen POTASSIUM 99 MG PO TABS Oral Take 1 tablet by mouth daily.    Marland Kitchen VITAMIN B-12 1000 MCG PO TABS Oral Take 1,000 mcg by mouth daily.    Marland Kitchen VITAMIN  C 500 MG PO TABS Oral Take 500 mg by mouth daily.    Marland Kitchen VITAMIN E 400 UNITS PO CAPS Oral Take 400 Units by mouth daily.    . IBUPROFEN 600 MG PO TABS Oral Take 1 tablet (600 mg total) by mouth every 8 (eight) hours as needed for pain. 30 tablet 0    BP 133/51  Pulse 98  Temp(Src) 98.1 F (36.7 C) (Oral)  Resp 20  Ht 5\' 4"  (1.626 m)  Wt 124 lb (56.246 kg)  BMI 21.28 kg/m2  SpO2 100%  Physical Exam  Nursing note and vitals reviewed. Constitutional: She is oriented to person, place, and time. She appears well-developed and well-nourished. No distress.  HENT:  Head: Normocephalic and atraumatic.  Eyes: EOM are normal. Pupils are equal, round, and reactive to light.  Neck: Normal range of motion. Neck supple. No tracheal deviation present.  Cardiovascular: Normal rate, regular rhythm and normal heart sounds.   Pulmonary/Chest: Effort normal and breath sounds normal. No respiratory distress.  Abdominal: Soft. Bowel sounds are normal. She exhibits no distension.    Musculoskeletal: Normal range of motion. She exhibits no edema.  Neurological: She is alert and oriented to person, place, and time. No sensory deficit.  Skin: Skin is warm and dry.  Psychiatric: She has a normal mood and affect. Her behavior is normal.    ED Course  Procedures (including critical care time)  DIAGNOSTIC STUDIES: Oxygen Saturation is 100% on room air, normal by my interpretation.    COORDINATION OF CARE:  0134: Discussed planned course of treatment and diagnosis. Discussed f/u with patient's podiatrist or orthopedist. Patient was agreeable.    Labs Reviewed - No data to display Dg Os Calcis Left  05/21/2011  *RADIOLOGY REPORT*  Clinical Data: Left heel pain.  No known injuries.  LEFT OS CALCIS - 2+ VIEW 05/21/2011:  Comparison: None.  Findings: No evidence of acute, subacute, or healed fractures. Tiny plantar calcaneal spur.  Subtalar joint intact.  IMPRESSION: Tiny plantar calcaneal spur.  Otherwise normal examination.  Original Report Authenticated By: Arnell Sieving, M.D.     1. Calcaneal spur       MDM  I personally performed the services described in this documentation, which was scribed in my presence. The recorded information has been reviewed and considered.           Nelia Shi, MD 05/21/11 [phone]

## 2011-05-21 NOTE — ED Notes (Signed)
Pt presents with left heel pain x 1 week. Pt denies injury. Pt states she thinks she may have fractured heel.

## 2011-05-24 DIAGNOSIS — J210 Acute bronchiolitis due to respiratory syncytial virus: Secondary | ICD-10-CM | POA: Diagnosis not present

## 2011-05-24 DIAGNOSIS — J0110 Acute frontal sinusitis, unspecified: Secondary | ICD-10-CM | POA: Diagnosis not present

## 2011-06-21 ENCOUNTER — Encounter: Payer: Self-pay | Admitting: Orthopedic Surgery

## 2011-06-21 ENCOUNTER — Ambulatory Visit (INDEPENDENT_AMBULATORY_CARE_PROVIDER_SITE_OTHER): Payer: Medicare Other | Admitting: Orthopedic Surgery

## 2011-06-21 VITALS — BP 150/70 | Ht 64.0 in | Wt 124.0 lb

## 2011-06-21 DIAGNOSIS — M722 Plantar fascial fibromatosis: Secondary | ICD-10-CM | POA: Diagnosis not present

## 2011-06-21 NOTE — Patient Instructions (Signed)
Go to [facility] for heel inserts

## 2011-06-21 NOTE — Progress Notes (Signed)
  Subjective:    [name] is a 78 y.o. female who presents with left foot pain. Onset of the symptoms was about a month ago. Precipitating event: none known. Current symptoms include: ability to bear weight, but with some pain, swelling and start up pain . Aggravating factors: any weight bearing and walking. Symptoms have gradually worsened. Patient has had no prior foot problems. Evaluation to date: plain films: abnormal small plantar spur . Treatment to date: OTC analgesics which are somewhat effective.  The following portions of the patient's history were reviewed and updated as appropriate: allergies, current medications, past family history, past medical history, past social history, past surgical history and problem list.  Review of Systems A comprehensive review of systems was negative except for: cough and SOB with seasonal allergies    Objective:    Ht 5\' 4"  (1.626 m)  Wt 124 lb (56.246 kg)  BMI 21.28 kg/m2  Vital signs are stable as recorded  General appearance is normal  The patient is alert and oriented x3  The patient's mood and affect are normal  Gait assessment: normal  The cardiovascular exam reveals normal pulses and temperature without edema swelling.  The lymphatic system is negative for palpable lymph nodes  The sensory exam is normal.  There are no pathologic reflexes.  Balance is normal.  Skin normal     Left foot:  point tenderness over the heel pad and normal microcirculation and capillary reflow, foot aligned normal no atrophy    Imaging: X-ray of was done at the hospital I reviewed it and agree with the report   Assessment:    Plantar fasciitis    Plan:    Natural history and expected course discussed. Questions answered. Steroid injection to heel/ plantar fascia insertion performed.   left heel injection.  Diagnosis plantar fasciitis  Verbal consent was obtained.  Timeout was taken  Lateral approach.  The heel was injected    Medications Depo-Medrol 40 mg per mL-one mL.  Lidocaine 1%-3 cc  Alcohol preparation, ethyl chloride, anesthesia.  The medication was injected without complication

## 2011-07-04 DIAGNOSIS — B9789 Other viral agents as the cause of diseases classified elsewhere: Secondary | ICD-10-CM | POA: Diagnosis not present

## 2011-07-04 DIAGNOSIS — J069 Acute upper respiratory infection, unspecified: Secondary | ICD-10-CM | POA: Diagnosis not present

## 2011-09-14 ENCOUNTER — Emergency Department (HOSPITAL_COMMUNITY): Payer: Medicare Other

## 2011-09-14 ENCOUNTER — Encounter (HOSPITAL_COMMUNITY): Payer: Self-pay | Admitting: *Deleted

## 2011-09-14 ENCOUNTER — Emergency Department (HOSPITAL_COMMUNITY)
Admission: EM | Admit: 2011-09-14 | Discharge: 2011-09-14 | Disposition: A | Discharge status: Home / Self Care | Payer: Medicare Other | Attending: Emergency Medicine | Admitting: Emergency Medicine

## 2011-09-14 DIAGNOSIS — I509 Heart failure, unspecified: Secondary | ICD-10-CM | POA: Insufficient documentation

## 2011-09-14 DIAGNOSIS — I4891 Unspecified atrial fibrillation: Secondary | ICD-10-CM | POA: Insufficient documentation

## 2011-09-14 DIAGNOSIS — J209 Acute bronchitis, unspecified: Secondary | ICD-10-CM | POA: Diagnosis not present

## 2011-09-14 DIAGNOSIS — E079 Disorder of thyroid, unspecified: Secondary | ICD-10-CM | POA: Diagnosis not present

## 2011-09-14 DIAGNOSIS — I2510 Atherosclerotic heart disease of native coronary artery without angina pectoris: Secondary | ICD-10-CM | POA: Insufficient documentation

## 2011-09-14 DIAGNOSIS — J40 Bronchitis, not specified as acute or chronic: Secondary | ICD-10-CM

## 2011-09-14 DIAGNOSIS — R0602 Shortness of breath: Secondary | ICD-10-CM | POA: Diagnosis not present

## 2011-09-14 DIAGNOSIS — I10 Essential (primary) hypertension: Secondary | ICD-10-CM | POA: Diagnosis not present

## 2011-09-14 DIAGNOSIS — J438 Other emphysema: Secondary | ICD-10-CM | POA: Diagnosis not present

## 2011-09-14 DIAGNOSIS — J1289 Other viral pneumonia: Secondary | ICD-10-CM | POA: Diagnosis not present

## 2011-09-14 HISTORY — DX: Atherosclerotic heart disease of native coronary artery without angina pectoris: I25.10

## 2011-09-14 HISTORY — DX: Heart failure, unspecified: I50.9

## 2011-09-14 HISTORY — DX: Paroxysmal atrial fibrillation: I48.0

## 2011-09-14 HISTORY — DX: Personal history of other diseases of the circulatory system: Z86.79

## 2011-09-14 HISTORY — DX: Cardiomyopathy, unspecified: I42.9

## 2011-09-14 MED ORDER — BENZONATATE 100 MG PO CAPS: 100.0000 mg | ORAL_CAPSULE | Freq: Three times a day (TID) | ORAL | Status: AC | PRN

## 2011-09-14 MED ORDER — ALBUTEROL SULFATE (5 MG/ML) 0.5% IN NEBU
2.5000 mg | INHALATION_SOLUTION | Freq: Once | RESPIRATORY_TRACT | Status: AC
  Administered 2011-09-14: 2.5 mg via RESPIRATORY_TRACT
  Filled 2011-09-14: qty 0.5

## 2011-09-14 MED ORDER — ALBUTEROL SULFATE HFA 108 (90 BASE) MCG/ACT IN AERS
2.0000 | INHALATION_SPRAY | RESPIRATORY_TRACT | Status: AC
  Administered 2011-09-14: 2 via RESPIRATORY_TRACT
  Filled 2011-09-14: qty 6.7

## 2011-09-14 MED ORDER — IPRATROPIUM BROMIDE 0.02 % IN SOLN
0.5000 mg | Freq: Once | RESPIRATORY_TRACT | Status: AC
  Administered 2011-09-14: 0.5 mg via RESPIRATORY_TRACT
  Filled 2011-09-14: qty 2.5

## 2011-09-14 NOTE — Discharge Instructions (Signed)
RESOURCE GUIDE  Chronic Pain Problems: Contact [facility]  [phone] Patients need to be referred by their primary care doctor.  Insufficient Money for Medicine: Contact United Way:  call "211" or [facility] [phone].  No Primary Care Doctor: - Call [facility]  [phone] - can help you locate a primary care doctor that  accepts your insurance, provides certain services, etc. - Physician Referral Service- 1-800-533-3463  Agencies that provide inexpensive medical care: - [facility]  [phone] - [facility]  [phone] - [facility]  [phone] - [facility]  [phone] - Planned Parenthood  [phone] - [facility]  [phone]  Medicaid-accepting [place] Providers: - [facility]- [address]  [phone], Mon-Fri 9am-7pm, Sat 9am-1pm - [facility]- [address], [address]  [phone] - [facility]- [address], [address]  [phone] - [facility]- [address] [place] [address]  [phone] - Veita Bland- [address], [address], [phone]  Only accepts [place] Access Medicaid patients after they have their name  applied to their card  Self Pay (no insurance) in [place]: - Sickle Cell Patients: Dr Eric Dean, [facility]  [address], [phone] - [facility]- [address]  [phone]       -     [facility] [place] [facility]- [address]       -     [facility]- see information above (Speak to Pam H if you do not have insurance)       -  [facility]- [address], [phone]       -  [facility] [place]- [address],  [phone]       -  [facility]- [address] [place] [address], [phone]       -  Dr Osei-Bonsu-  [address], [place], [phone]       -  [facility]- [address], [phone]       -  [facility]- [address] [place] [address], [phone], also [address], [phone]       -    [facility]- [address], [phone], 1st & 3rd Saturday   every month, 10am-1pm  1) Find a Doctor and Pay Out of Pocket Although you won't have to find out who is covered by your insurance plan, it is a good idea to ask around and get recommendations. You will then need to call the office and see if the doctor you have chosen will accept you as a new patient and what types of options they offer for patients who are self-pay. Some doctors offer discounts or will set up payment plans for their patients who do not have insurance, but you will need to ask so you aren't surprised when you get to your appointment.  2) Contact Your Local Health Department Not all health departments have doctors that can see patients for sick visits, but many do, so it is worth a call to see if yours does. If you don't know where your local health department is, you can check in your phone book. The CDC also has a tool to help you locate your state's health department, and many state websites also have   listings of all of their local health departments.  3) Find a Walk-in Clinic If your illness is not likely to be very severe or complicated, you may want to try a walk in clinic. These are popping up all over the country in pharmacies, drugstores, and shopping centers. They're usually staffed by nurse practitioners or physician assistants that have been trained to treat common illnesses and complaints. They're usually fairly quick and inexpensive. However, if you have serious medical issues or chronic medical problems, these are probably not your best option  STD Testing - [place] [facility] [place], [facility], [address], [place], phone [phone] or 1-877-539-9860.  Monday - Friday, call for an appointment. - [place]  [facility] [place], [facility] Clinic, [address], [place], phone [phone] or 1-877-539-9860.  Monday - Friday, call for an appointment.  Abuse/Neglect: - [place] Child Abuse Hotline (336) 641-3795 - [place] Child Abuse Hotline 800-378-5315 (After Hours)  Emergency Shelter:  [facility] (336) 271-5985  Maternity Homes: - [facility] (336) 275-9566 - [facility] (704) 372-4663  MRSA Hotline #:   832-7006  [place] Resources  [facility] [place]  [employer] [place] [facility] [address]                 [address]         [address]  [place]                                               [place]                              [place] Phone:  [phone]                                  Phone:  [phone]                   Phone:  [phone]  [place] [facility], [phone] - [place] Services - [facility]- 1-888-581-9988       -     [facility] in [place], [address],                                  336-349-4454, Insurance  [place] Child Abuse Hotline (336) 342-1394 or (336) 342-3537 (After Hours)   Behavioral Health Services  Substance Abuse Resources: - [facility]  336-882-2125 - [facility] 336-784-9470 - [facility] 336-285-9073 - [facility] 336-845-3988 - Residential & Outpatient Substance Abuse Program  800-659-3381  Psychological Services: -  Health  [phone] - [facility]  [phone] - [place] [facility], [address], [place], ACCESS LINE: 1-800-853-5163 or 336-641-4981, Http://www.[facility].com/services/adult.htm  Dental Assistance  If unable to pay or uninsured, contact:  [facility] or [place] [facility] to become qualified for the adult dental  clinic.  Patients with Medicaid:  [facility] [address], [phone], [phone]  If unable   to pay, or uninsured, contact [facility] 714-455-7330) or [facility] Department [phone] in [place], [phone] in [facility]) to become qualified for the adult dental clinic  Other Low-Cost Community Dental Services: - [facility]- [address] [place], [place], [place], [address], [phone], [phone], 2nd and 4th Thursday of the month at 6:30am.  10 clients each day by appointment, can sometimes see walk-in patients if someone does not show for an appointment. [facility]- [address] [place], [place], [address], [phone] - [facility]- [address], [place], [place], [address], [phone] [facility] Health Department- [phone] [facility] Health Department- [phone] [facility] [phone]     Take the prescription as directed.  Use the albuterol inhaler (2 to 4 puffs) every 4 hours for the next 7 days, then as needed for cough, wheezing, or shortness of breath.  There were no signs of pneumonia or congestive heart failure on your chest x-ray today. Call your regular medical doctor today to schedule a follow up appointment within the next 2 to 3 days.  Return to the Emergency Department immediately sooner if worsening.

## 2011-09-14 NOTE — ED Notes (Signed)
Pt ambulated down the hall and pt's oxygen stayed at 97% on room air. Dr. Clarene Duke notified.

## 2011-09-14 NOTE — ED Provider Notes (Signed)
History  This chart was scribed for Laray Anger, DO by Bennett Scrape. This patient was seen in room APA12/APA12 and the patient's care was started at 2:30PM.  CSN: [id number]  Arrival date & time 09/14/11  1347   First MD Initiated Contact with Patient 09/14/11 1430      Chief Complaint  Patient presents with  . Shortness of Breath  . Cough    The history is provided by the patient. No language interpreter was used.    [name] is a 78 y.o. female who presents to the Emergency Department complaining of gradual onset and persistence of constant SOB and non-productive cough for the past 1 week.  Has been associated with runny/stuffy nose. She denies having any modifying factors. She denies taking OTC medications at home to improve symptoms. She states that she called her PCP but could not get an appointment; so she went to Urgent Care.  States they took a xray and sent her to the ED for "possible pneumonia."  Denies any change in her symptoms over the past 1 week, denies CP/palpitations, no wheezing, no abd pain, no N/V/D, no back pain, no pedal edema, no fevers, no rash.       Dr. Phillips Odor is PCP.  Past Medical History  Diagnosis Date  . Thyroid disease   . Hypertension   . CHF (congestive heart failure)   . Coronary artery disease   . Cardiomyopathy   . Paroxysmal atrial fibrillation   . History of sick sinus syndrome     Past Surgical History  Procedure Date  . Appendectomy   . Cholecystectomy   . Pacemaker placement   . Knee arthroscopy     Family History  Problem Relation Age of Onset  . Heart disease    . Cancer    . Kidney disease      History  Substance Use Topics  . Smoking status: Never Smoker   . Smokeless tobacco: Not on file  . Alcohol Use: No     Review of Systems ROS: Statement: All systems negative except as marked or noted in the HPI; Constitutional: Negative for fever and chills. ; ; Eyes: Negative for eye pain, redness and  discharge. ; ; ENMT: +rhinorrhea.  Negative for ear pain, hoarseness, sinus pressure and sore throat. ; ; Cardiovascular: Negative for chest pain, palpitations, diaphoresis, and peripheral edema. ; ; Respiratory: +cough, SOB. Negative for wheezing and stridor. ; ; Gastrointestinal: Negative for nausea, vomiting, diarrhea, abdominal pain, blood in stool, hematemesis, jaundice and rectal bleeding. . ; ; Genitourinary: Negative for dysuria, flank pain and hematuria. ; ; Musculoskeletal: Negative for back pain and neck pain. Negative for swelling and trauma.; ; Skin: Negative for pruritus, rash, abrasions, blisters, bruising and skin lesion.; ; Neuro: Negative for headache, lightheadedness and neck stiffness. Negative for weakness, altered level of consciousness , altered mental status, extremity weakness, paresthesias, involuntary movement, seizure and syncope.     Allergies  Macrodantin and Sulfa antibiotics  Home Medications   Current Outpatient Rx  Name Route Sig Dispense Refill  . ASPIRIN EC 81 MG PO TBEC Oral Take 81 mg by mouth at bedtime.    Marland Kitchen CALCIUM CARBONATE-VITAMIN D 600-400 MG-UNIT PO TABS Oral Take 2 tablets by mouth daily.    Marland Kitchen DIGOXIN 0.125 MG PO TABS Oral Take 125 mcg by mouth daily.    Marland Kitchen ESOMEPRAZOLE MAGNESIUM 40 MG PO CPDR Oral Take 40 mg by mouth daily before breakfast.    .  EZETIMIBE-SIMVASTATIN 10-20 MG PO TABS Oral Take 1 tablet by mouth at bedtime.    . OMEGA-3 FATTY ACIDS 1000 MG PO CAPS Oral Take 1 g by mouth daily.    . FUROSEMIDE 20 MG PO TABS Oral Take 20 mg by mouth daily.     Marland Kitchen LEVOCETIRIZINE DIHYDROCHLORIDE 5 MG PO TABS Oral Take 5 mg by mouth every evening.    Marland Kitchen LEVOTHYROXINE SODIUM 75 MCG PO TABS Oral Take 75 mcg by mouth daily.    Marland Kitchen LISINOPRIL 30 MG PO TABS Oral Take 15 mg by mouth daily.    Marland Kitchen MAGNESIUM 250 MG PO TABS Oral Take 1 tablet by mouth daily.    Marland Kitchen METOPROLOL SUCCINATE ER 50 MG PO TB24 Oral Take 50 mg by mouth daily. Take with or immediately following a  meal.    . ADULT MULTIVITAMIN W/MINERALS CH Oral Take 1 tablet by mouth daily.    Marland Kitchen POTASSIUM 99 MG PO TABS Oral Take 1 tablet by mouth daily.    Marland Kitchen VITAMIN B-12 1000 MCG PO TABS Oral Take 1,000 mcg by mouth daily.    Marland Kitchen VITAMIN C 500 MG PO TABS Oral Take 500 mg by mouth daily.    Marland Kitchen VITAMIN E 400 UNITS PO CAPS Oral Take 400 Units by mouth daily.      Triage Vitals: BP 152/65  Pulse 74  Temp 98.3 F (36.8 C) (Oral)  Resp 20  Ht 5\' 4"  (1.626 m)  Wt 123 lb (55.792 kg)  BMI 21.11 kg/m2  SpO2 96%  Physical Exam 1445: Physical examination:  Nursing notes reviewed; Vital signs and O2 SAT reviewed;  Constitutional: Well developed, Well nourished, Well hydrated, In no acute distress; Head:  Normocephalic, atraumatic; Eyes: EOMI, PERRL, No scleral icterus; ENMT: TM's clear bilat.  +edemetous nasal turbinates bilat with clear rhinorrhea. Mouth and pharynx normal, Mucous membranes moist; Neck: Supple, Full range of motion, No lymphadenopathy; Cardiovascular: Regular rate and rhythm, No gallop; Respiratory: Breath sounds clear, diminished & equal bilaterally, No wheezes.  Speaking full sentences with ease, Normal respiratory effort/excursion; Chest: Nontender, Movement normal; Abdomen: Soft, Nontender, Nondistended, Normal bowel sounds; Genitourinary: No CVA tenderness; Extremities: Pulses normal, No tenderness, No edema, No calf edema or asymmetry.; Neuro: AA&Ox3, Major CN grossly intact.  Speech clear. No gross focal motor or sensory deficits in extremities.; Skin: Color normal, Warm, Dry.   ED Course  Procedures    MDM  MDM Reviewed: nursing note, vitals and previous chart Interpretation: x-ray   Dg Chest 2 View 09/14/2011  *RADIOLOGY REPORT*  Clinical Data: Cough, shortness of breath, question infiltrate, history CHF  CHEST - 2 VIEW  Comparison: 09/14/2011 outside exam from [facility]  Findings: Left subclavian sequential transvenous pacemaker leads project at right  atrium and right ventricle. Upper-normal size of cardiac silhouette. Calcified thoracic aorta. Mediastinal contours and pulmonary vascularity normal. Lungs are emphysematous but clear. No pleural effusion or pneumothorax. Pectus deformity of osseous demineralization noted.  IMPRESSION: Upper normal-sized cardiac silhouette post pacemaker. Emphysematous changes. No acute abnormalities.  Original Report Authenticated By: Lollie Marrow, M.D.      4:25 PM:  Pt feels "better" after neb and wants to go home now.  Has ambulated in the ED with steady gait, easy resps, Sats 97% R/A, lungs CTA bilat.  CXR without pneumonia or CHF.  Appears URI/bronchitis at this time.  Will dose MDI here.  Dx testing d/w pt and family.  Questions answered.  Verb understanding, agreeable to d/c  home with outpt f/u.   The patient appears reasonably screened and/or stabilized for discharge and I doubt any other medical condition or other [facility] requiring further screening, evaluation, or treatment in the ED at this time prior to discharge.      I personally performed the services described in this documentation, which was scribed in my presence. The recorded information has been reviewed and considered. Sharod Petsch Allison Quarry, DO 09/17/11 1541

## 2011-09-14 NOTE — ED Notes (Signed)
Cough, sob since 6/13, No fever.  Nonproductive cough,   Sent from Urgent care with x ray

## 2011-09-23 DIAGNOSIS — IMO0002 Reserved for concepts with insufficient information to code with codable children: Secondary | ICD-10-CM | POA: Diagnosis not present

## 2011-09-23 DIAGNOSIS — N390 Urinary tract infection, site not specified: Secondary | ICD-10-CM | POA: Diagnosis not present

## 2011-09-23 DIAGNOSIS — I10 Essential (primary) hypertension: Secondary | ICD-10-CM | POA: Diagnosis not present

## 2011-09-23 DIAGNOSIS — J209 Acute bronchitis, unspecified: Secondary | ICD-10-CM | POA: Diagnosis not present

## 2011-10-06 DIAGNOSIS — IMO0002 Reserved for concepts with insufficient information to code with codable children: Secondary | ICD-10-CM | POA: Diagnosis not present

## 2011-10-06 DIAGNOSIS — E785 Hyperlipidemia, unspecified: Secondary | ICD-10-CM | POA: Diagnosis not present

## 2011-10-06 DIAGNOSIS — I10 Essential (primary) hypertension: Secondary | ICD-10-CM | POA: Diagnosis not present

## 2011-10-06 DIAGNOSIS — N189 Chronic kidney disease, unspecified: Secondary | ICD-10-CM | POA: Diagnosis not present

## 2011-10-17 DIAGNOSIS — I2510 Atherosclerotic heart disease of native coronary artery without angina pectoris: Secondary | ICD-10-CM | POA: Diagnosis not present

## 2011-10-17 DIAGNOSIS — E782 Mixed hyperlipidemia: Secondary | ICD-10-CM | POA: Diagnosis not present

## 2011-10-17 DIAGNOSIS — Z45018 Encounter for adjustment and management of other part of cardiac pacemaker: Secondary | ICD-10-CM | POA: Diagnosis not present

## 2011-10-17 DIAGNOSIS — I10 Essential (primary) hypertension: Secondary | ICD-10-CM | POA: Diagnosis not present

## 2011-10-31 DIAGNOSIS — IMO0002 Reserved for concepts with insufficient information to code with codable children: Secondary | ICD-10-CM | POA: Diagnosis not present

## 2011-10-31 DIAGNOSIS — J209 Acute bronchitis, unspecified: Secondary | ICD-10-CM | POA: Diagnosis not present

## 2011-10-31 DIAGNOSIS — J019 Acute sinusitis, unspecified: Secondary | ICD-10-CM | POA: Diagnosis not present

## 2011-10-31 DIAGNOSIS — I10 Essential (primary) hypertension: Secondary | ICD-10-CM | POA: Diagnosis not present

## 2011-12-24 DIAGNOSIS — Z23 Encounter for immunization: Secondary | ICD-10-CM | POA: Diagnosis not present

## 2012-01-25 DIAGNOSIS — J019 Acute sinusitis, unspecified: Secondary | ICD-10-CM | POA: Diagnosis not present

## 2012-01-25 DIAGNOSIS — N390 Urinary tract infection, site not specified: Secondary | ICD-10-CM | POA: Diagnosis not present

## 2012-01-30 DIAGNOSIS — H5200 Hypermetropia, unspecified eye: Secondary | ICD-10-CM | POA: Diagnosis not present

## 2012-01-30 DIAGNOSIS — H2589 Other age-related cataract: Secondary | ICD-10-CM | POA: Diagnosis not present

## 2012-01-30 DIAGNOSIS — H524 Presbyopia: Secondary | ICD-10-CM | POA: Diagnosis not present

## 2012-01-30 DIAGNOSIS — H52229 Regular astigmatism, unspecified eye: Secondary | ICD-10-CM | POA: Diagnosis not present

## 2012-02-29 DIAGNOSIS — R197 Diarrhea, unspecified: Secondary | ICD-10-CM | POA: Diagnosis not present

## 2012-03-01 DIAGNOSIS — R197 Diarrhea, unspecified: Secondary | ICD-10-CM | POA: Diagnosis not present

## 2012-04-05 ENCOUNTER — Other Ambulatory Visit (HOSPITAL_COMMUNITY): Payer: Self-pay | Admitting: Physician Assistant

## 2012-04-05 ENCOUNTER — Ambulatory Visit (HOSPITAL_COMMUNITY)
Admission: RE | Admit: 2012-04-05 | Discharge: 2012-04-05 | Disposition: A | Discharge status: Home / Self Care | Payer: Medicare Other | Source: Ambulatory Visit | Attending: Physician Assistant | Admitting: Physician Assistant

## 2012-04-05 DIAGNOSIS — I10 Essential (primary) hypertension: Secondary | ICD-10-CM | POA: Diagnosis not present

## 2012-04-05 DIAGNOSIS — S79929A Unspecified injury of unspecified thigh, initial encounter: Secondary | ICD-10-CM | POA: Diagnosis not present

## 2012-04-05 DIAGNOSIS — M25559 Pain in unspecified hip: Secondary | ICD-10-CM | POA: Diagnosis not present

## 2012-04-05 DIAGNOSIS — S79919A Unspecified injury of unspecified hip, initial encounter: Secondary | ICD-10-CM | POA: Diagnosis not present

## 2012-04-05 DIAGNOSIS — IMO0002 Reserved for concepts with insufficient information to code with codable children: Secondary | ICD-10-CM | POA: Diagnosis not present

## 2012-04-05 DIAGNOSIS — S300XXA Contusion of lower back and pelvis, initial encounter: Secondary | ICD-10-CM | POA: Diagnosis not present

## 2012-04-05 DIAGNOSIS — W19XXXA Unspecified fall, initial encounter: Secondary | ICD-10-CM | POA: Insufficient documentation

## 2012-04-16 DIAGNOSIS — E039 Hypothyroidism, unspecified: Secondary | ICD-10-CM | POA: Diagnosis not present

## 2012-04-16 DIAGNOSIS — E782 Mixed hyperlipidemia: Secondary | ICD-10-CM | POA: Diagnosis not present

## 2012-04-16 DIAGNOSIS — Z79899 Other long term (current) drug therapy: Secondary | ICD-10-CM | POA: Diagnosis not present

## 2012-04-16 DIAGNOSIS — R5381 Other malaise: Secondary | ICD-10-CM | POA: Diagnosis not present

## 2012-04-16 DIAGNOSIS — Z45018 Encounter for adjustment and management of other part of cardiac pacemaker: Secondary | ICD-10-CM | POA: Diagnosis not present

## 2012-04-16 DIAGNOSIS — I10 Essential (primary) hypertension: Secondary | ICD-10-CM | POA: Diagnosis not present

## 2012-04-16 DIAGNOSIS — I4891 Unspecified atrial fibrillation: Secondary | ICD-10-CM | POA: Diagnosis not present

## 2012-04-24 ENCOUNTER — Ambulatory Visit (HOSPITAL_COMMUNITY)
Admission: RE | Admit: 2012-04-24 | Discharge: 2012-04-24 | Disposition: A | Discharge status: Home / Self Care | Payer: Medicare Other | Source: Ambulatory Visit | Attending: Cardiovascular Disease | Admitting: Cardiovascular Disease

## 2012-04-24 DIAGNOSIS — I10 Essential (primary) hypertension: Secondary | ICD-10-CM | POA: Insufficient documentation

## 2012-04-24 DIAGNOSIS — I509 Heart failure, unspecified: Secondary | ICD-10-CM | POA: Diagnosis not present

## 2012-04-24 DIAGNOSIS — R079 Chest pain, unspecified: Secondary | ICD-10-CM | POA: Diagnosis not present

## 2012-04-24 DIAGNOSIS — I447 Left bundle-branch block, unspecified: Secondary | ICD-10-CM | POA: Insufficient documentation

## 2012-04-24 NOTE — Progress Notes (Signed)
*  PRELIMINARY RESULTS* Echocardiogram 2D Echocardiogram has been performed.  [name] 04/24/2012, 11:57 AM 

## 2012-05-09 DIAGNOSIS — R059 Cough, unspecified: Secondary | ICD-10-CM | POA: Diagnosis not present

## 2012-05-09 DIAGNOSIS — J343 Hypertrophy of nasal turbinates: Secondary | ICD-10-CM | POA: Diagnosis not present

## 2012-05-09 DIAGNOSIS — Z713 Dietary counseling and surveillance: Secondary | ICD-10-CM | POA: Diagnosis not present

## 2012-05-09 DIAGNOSIS — IMO0002 Reserved for concepts with insufficient information to code with codable children: Secondary | ICD-10-CM | POA: Diagnosis not present

## 2012-05-09 DIAGNOSIS — Z7182 Exercise counseling: Secondary | ICD-10-CM | POA: Diagnosis not present

## 2012-05-09 DIAGNOSIS — R05 Cough: Secondary | ICD-10-CM | POA: Diagnosis not present

## 2012-05-23 DIAGNOSIS — N390 Urinary tract infection, site not specified: Secondary | ICD-10-CM | POA: Diagnosis not present

## 2012-05-23 DIAGNOSIS — IMO0002 Reserved for concepts with insufficient information to code with codable children: Secondary | ICD-10-CM | POA: Diagnosis not present

## 2012-05-23 DIAGNOSIS — J984 Other disorders of lung: Secondary | ICD-10-CM | POA: Diagnosis not present

## 2012-07-16 ENCOUNTER — Encounter: Payer: Self-pay | Admitting: *Deleted

## 2012-08-08 DIAGNOSIS — E785 Hyperlipidemia, unspecified: Secondary | ICD-10-CM | POA: Diagnosis not present

## 2012-08-08 DIAGNOSIS — I10 Essential (primary) hypertension: Secondary | ICD-10-CM | POA: Diagnosis not present

## 2012-08-08 DIAGNOSIS — IMO0002 Reserved for concepts with insufficient information to code with codable children: Secondary | ICD-10-CM | POA: Diagnosis not present

## 2012-08-08 DIAGNOSIS — J984 Other disorders of lung: Secondary | ICD-10-CM | POA: Diagnosis not present

## 2012-08-08 DIAGNOSIS — R059 Cough, unspecified: Secondary | ICD-10-CM | POA: Diagnosis not present

## 2012-08-08 DIAGNOSIS — J449 Chronic obstructive pulmonary disease, unspecified: Secondary | ICD-10-CM | POA: Diagnosis not present

## 2012-08-08 DIAGNOSIS — R05 Cough: Secondary | ICD-10-CM | POA: Diagnosis not present

## 2012-09-15 ENCOUNTER — Emergency Department (HOSPITAL_COMMUNITY)
Admission: EM | Admit: 2012-09-15 | Discharge: 2012-09-15 | Disposition: A | Discharge status: Home / Self Care | Payer: Medicare Other | Attending: Emergency Medicine | Admitting: Emergency Medicine

## 2012-09-15 ENCOUNTER — Encounter (HOSPITAL_COMMUNITY): Payer: Self-pay | Admitting: *Deleted

## 2012-09-15 DIAGNOSIS — I130 Hypertensive heart and chronic kidney disease with heart failure and stage 1 through stage 4 chronic kidney disease, or unspecified chronic kidney disease: Secondary | ICD-10-CM | POA: Diagnosis not present

## 2012-09-15 DIAGNOSIS — I10 Essential (primary) hypertension: Secondary | ICD-10-CM | POA: Insufficient documentation

## 2012-09-15 DIAGNOSIS — R110 Nausea: Secondary | ICD-10-CM | POA: Insufficient documentation

## 2012-09-15 DIAGNOSIS — I4891 Unspecified atrial fibrillation: Secondary | ICD-10-CM | POA: Insufficient documentation

## 2012-09-15 DIAGNOSIS — Z950 Presence of cardiac pacemaker: Secondary | ICD-10-CM | POA: Insufficient documentation

## 2012-09-15 DIAGNOSIS — I495 Sick sinus syndrome: Secondary | ICD-10-CM | POA: Diagnosis not present

## 2012-09-15 DIAGNOSIS — I428 Other cardiomyopathies: Secondary | ICD-10-CM | POA: Insufficient documentation

## 2012-09-15 DIAGNOSIS — R42 Dizziness and giddiness: Secondary | ICD-10-CM | POA: Diagnosis not present

## 2012-09-15 DIAGNOSIS — Z7982 Long term (current) use of aspirin: Secondary | ICD-10-CM | POA: Insufficient documentation

## 2012-09-15 DIAGNOSIS — H538 Other visual disturbances: Secondary | ICD-10-CM | POA: Diagnosis not present

## 2012-09-15 DIAGNOSIS — Z79899 Other long term (current) drug therapy: Secondary | ICD-10-CM | POA: Insufficient documentation

## 2012-09-15 DIAGNOSIS — I509 Heart failure, unspecified: Secondary | ICD-10-CM | POA: Insufficient documentation

## 2012-09-15 MED ORDER — MECLIZINE HCL 25 MG PO TABS: 25.0000 mg | ORAL_TABLET | Freq: Four times a day (QID) | ORAL | Status: DC | PRN

## 2012-09-15 MED ORDER — ONDANSETRON 4 MG PO TBDP
4.0000 mg | ORAL_TABLET | Freq: Once | ORAL | Status: AC
  Administered 2012-09-15: 4 mg via ORAL
  Filled 2012-09-15: qty 1

## 2012-09-15 MED ORDER — MECLIZINE HCL 12.5 MG PO TABS
25.0000 mg | ORAL_TABLET | Freq: Once | ORAL | Status: AC
  Administered 2012-09-15: 25 mg via ORAL
  Filled 2012-09-15: qty 2

## 2012-09-15 MED ORDER — ONDANSETRON HCL 4 MG PO TABS: 4.0000 mg | ORAL_TABLET | Freq: Four times a day (QID) | ORAL | Status: DC | PRN

## 2012-09-15 NOTE — ED Notes (Signed)
Patient reports vertigo with position change.  Has taken old medication she was given for same years ago along w/Dramamine w/out relief.  States if she lies down it subsides.  C/O room spinning with change in lying to sitting position.

## 2012-09-15 NOTE — ED Notes (Signed)
Patient stood and ambulated in room.  States dizziness is slightly better.  No nausea.

## 2012-09-15 NOTE — ED Provider Notes (Signed)
History    This chart was scribed for [name], MD, by [name], ED scribe. The patient was seen in room APA08/APA08 and the patient's care was started at 1257.    CSN: [id number]  Arrival date & time 09/15/12  1223   First MD Initiated Contact with Patient 09/15/12 1257      Chief Complaint  Patient presents with  . Dizziness    (Consider location/radiation/quality/duration/timing/severity/associated sxs/prior treatment) The history is provided by the patient and medical records. No language interpreter was used.   HPI Comments: [name] is a 79 y.o. female with a h/o of Vertigo several years ago who presents to the Emergency Department complaining of sudden onset, constant, gradually worsening dizziness that is aggravated by looking down and alleviated by nothing that began 3 days ago while she was at the salon and they put her back to wash her hair with associated nausea and "eyes feel crossed and have a hard time focusing". She states that she thought the symptoms began to improve for a short time yesterday morning without treatment, but worsened within a few hours. She also complains of a frontal HA that began yesterday, but has since resolved. Denies emesis, numbness, and tingling in her arms or legs. She reports difficulty with ambulation because of dizziness and she has to hold onto things to walk, but states that the dizziness does not worsen while walking. She states that the current dizziness feels similar to her last episode of Vertigo, which was 2-3 years ago, but more severe.   She is a nonsmoker who consumes an occasional glass of wine. She lives alone and is independently ambulatory at baseline.   PCP [name]  Past Medical History  Diagnosis Date  . Thyroid disease   . Hypertension   . CHF (congestive heart failure)   . Coronary artery disease   . Cardiomyopathy   . Paroxysmal atrial fibrillation   . History of sick sinus syndrome     Past Surgical  History  Procedure Laterality Date  . Appendectomy    . Cholecystectomy    . Pacemaker placement    . Knee arthroscopy      Family History  Problem Relation Age of Onset  . Heart disease    . Cancer    . Kidney disease      History  Substance Use Topics  . Smoking status: Never Smoker   . Smokeless tobacco: Not on file  . Alcohol Use: No  lives at home Lives alone   OB History   Grav Para Term Preterm Abortions TAB SAB Ect Mult Living                  Review of Systems  Eyes: Positive for visual disturbance.  Gastrointestinal: Positive for nausea. Negative for vomiting.  Neurological: Positive for dizziness. Negative for headaches.       Denies numbness and tingling.   All other systems reviewed and are negative.    Allergies  Macrodantin and Sulfa antibiotics  Home Medications   Current Outpatient Rx  Name  Route  Sig  Dispense  Refill  . aspirin EC 81 MG tablet   Oral   Take 81 mg by mouth at bedtime.         . Calcium Carbonate-Vitamin D (CALCIUM 600+D) 600-400 MG-UNIT per tablet   Oral   Take 2 tablets by mouth daily.         . digoxin (LANOXIN) 0.125 MG tablet  Oral   Take 125 mcg by mouth daily.         Marland Kitchen esomeprazole (NEXIUM) 40 MG capsule   Oral   Take 40 mg by mouth daily before breakfast.         . ezetimibe-simvastatin (VYTORIN) 10-20 MG per tablet   Oral   Take 1 tablet by mouth at bedtime.         . fish oil-omega-3 fatty acids 1000 MG capsule   Oral   Take 1 g by mouth daily.         . furosemide (LASIX) 20 MG tablet   Oral   Take 20 mg by mouth daily.          Marland Kitchen levocetirizine (XYZAL) 5 MG tablet   Oral   Take 5 mg by mouth every evening.         Marland Kitchen levothyroxine (SYNTHROID, LEVOTHROID) 75 MCG tablet   Oral   Take 75 mcg by mouth daily.         Marland Kitchen lisinopril (PRINIVIL,ZESTRIL) 30 MG tablet   Oral   Take 15 mg by mouth daily.         . Magnesium 250 MG TABS   Oral   Take 1 tablet by mouth daily.          . metoprolol succinate (TOPROL-XL) 50 MG 24 hr tablet   Oral   Take 50 mg by mouth daily. Take with or immediately following a meal.         . Multiple Vitamin (MULITIVITAMIN WITH MINERALS) TABS   Oral   Take 1 tablet by mouth daily.         . Potassium 99 MG TABS   Oral   Take 1 tablet by mouth daily.         . vitamin B-12 (CYANOCOBALAMIN) 1000 MCG tablet   Oral   Take 1,000 mcg by mouth daily.         . vitamin C (ASCORBIC ACID) 500 MG tablet   Oral   Take 500 mg by mouth daily.         . vitamin E 400 UNIT capsule   Oral   Take 400 Units by mouth daily.           BP 155/67  Pulse 72  Temp(Src) 97.9 F (36.6 C) (Oral)  Resp 16  Ht 5\' 3"  (1.6 m)  Wt 120 lb (54.432 kg)  BMI 21.26 kg/m2  SpO2 97%  Vital signs normal    Physical Exam  Nursing note and vitals reviewed. Constitutional: She is oriented to person, place, and time. She appears well-developed and well-nourished.  Non-toxic appearance. She does not appear ill. No distress.  When she sat up, she was initially dizzy, but it quickly went away.  HENT:  Head: Normocephalic and atraumatic.  Right Ear: External ear normal.  Left Ear: External ear normal.  Nose: Nose normal. No mucosal edema or rhinorrhea.  Mouth/Throat: Oropharynx is clear and moist and mucous membranes are normal. No dental abscesses or edematous.  Eyes: Conjunctivae and EOM are normal. Pupils are equal, round, and reactive to light. Right eye exhibits no nystagmus. Left eye exhibits no nystagmus.  Neck: Normal range of motion and full passive range of motion without pain. Neck supple.  Cardiovascular: Normal rate, regular rhythm and normal heart sounds.  Exam reveals no gallop and no friction rub.   No murmur heard. Pulmonary/Chest: Effort normal and breath sounds normal. No respiratory distress. She has  no wheezes. She has no rhonchi. She has no rales. She exhibits no tenderness and no crepitus.  Abdominal: Soft. Normal  appearance and bowel sounds are normal. She exhibits no distension. There is no tenderness. There is no rebound and no guarding.  Musculoskeletal: Normal range of motion. She exhibits no edema and no tenderness.  Moves all extremities well.   Neurological: She is alert and oriented to person, place, and time. She has normal strength. No cranial nerve deficit.  CN II-XII are intact. No focal or motor weakness.  Skin: Skin is warm, dry and intact. No rash noted. No erythema. No pallor.  Psychiatric: She has a normal mood and affect. Her speech is normal and behavior is normal. Her mood appears not anxious.    ED Course  Procedures (including critical care time)  Medications  meclizine (ANTIVERT) tablet 25 mg (25 mg Oral Given 09/15/12 1338)  ondansetron (ZOFRAN-ODT) disintegrating tablet 4 mg (4 mg Oral Given 09/15/12 1339)     DIAGNOSTIC STUDIES: Oxygen Saturation is 97% on room air, normal by my interpretation.    COORDINATION OF CARE:  13:29- Discussed planned course of treatment with the patient, including Antivert and Zofran, who is agreeable at this time.  15:23- She is feeling better and is ready for discharge. States she was able to walk in her room and the dizziness was better.      1. Vertigo    Discharge Medication List as of 09/15/2012  3:25 PM    START taking these medications   Details  meclizine (ANTIVERT) 25 MG tablet Take 1 tablet (25 mg total) by mouth 4 (four) times daily as needed., Starting 09/15/2012, Until Discontinued, Print    ondansetron (ZOFRAN) 4 MG tablet Take 1 tablet (4 mg total) by mouth every 6 (six) hours as needed for nausea (or dizziness)., Starting 09/15/2012, Until Discontinued, Print        Plan discharge  Devoria Albe, MD, FACEP    MDM   I personally performed the services described in this documentation, which was scribed in my presence. The recorded information has been reviewed and considered.  Devoria Albe, MD, FACEP       [name], MD 09/15/12 [phone]

## 2012-09-15 NOTE — ED Notes (Signed)
Dizziness and unsteady gait x 2 days with nausea.  Reports headache yesterday, denies today.  Denies weakness/slurred speech.

## 2012-09-15 NOTE — ED Notes (Signed)
Patient with no complaints at this time. Respirations even and unlabored. Skin warm/dry. Discharge instructions reviewed with patient at this time. Patient given opportunity to voice concerns/ask questions. Patient discharged at this time and left Emergency Department with steady gait.   

## 2012-09-19 DIAGNOSIS — R3919 Other difficulties with micturition: Secondary | ICD-10-CM | POA: Diagnosis not present

## 2012-09-19 DIAGNOSIS — IMO0002 Reserved for concepts with insufficient information to code with codable children: Secondary | ICD-10-CM | POA: Diagnosis not present

## 2012-09-19 DIAGNOSIS — R300 Dysuria: Secondary | ICD-10-CM | POA: Diagnosis not present

## 2012-09-24 ENCOUNTER — Other Ambulatory Visit (HOSPITAL_COMMUNITY): Payer: Self-pay | Admitting: Nephrology

## 2012-09-24 DIAGNOSIS — N289 Disorder of kidney and ureter, unspecified: Secondary | ICD-10-CM

## 2012-09-24 DIAGNOSIS — D649 Anemia, unspecified: Secondary | ICD-10-CM | POA: Diagnosis not present

## 2012-09-24 DIAGNOSIS — I10 Essential (primary) hypertension: Secondary | ICD-10-CM | POA: Diagnosis not present

## 2012-09-24 DIAGNOSIS — E039 Hypothyroidism, unspecified: Secondary | ICD-10-CM | POA: Diagnosis not present

## 2012-09-24 DIAGNOSIS — R809 Proteinuria, unspecified: Secondary | ICD-10-CM | POA: Diagnosis not present

## 2012-09-24 DIAGNOSIS — N183 Chronic kidney disease, stage 3 unspecified: Secondary | ICD-10-CM | POA: Diagnosis not present

## 2012-10-12 DIAGNOSIS — J984 Other disorders of lung: Secondary | ICD-10-CM | POA: Diagnosis not present

## 2012-10-12 DIAGNOSIS — IMO0002 Reserved for concepts with insufficient information to code with codable children: Secondary | ICD-10-CM | POA: Diagnosis not present

## 2012-10-15 ENCOUNTER — Ambulatory Visit (HOSPITAL_COMMUNITY)
Admission: RE | Admit: 2012-10-15 | Discharge: 2012-10-15 | Disposition: A | Discharge status: Home / Self Care | Payer: Medicare Other | Source: Ambulatory Visit | Attending: Nephrology | Admitting: Nephrology

## 2012-10-15 DIAGNOSIS — N289 Disorder of kidney and ureter, unspecified: Secondary | ICD-10-CM

## 2012-10-15 DIAGNOSIS — D649 Anemia, unspecified: Secondary | ICD-10-CM | POA: Diagnosis not present

## 2012-10-15 DIAGNOSIS — R809 Proteinuria, unspecified: Secondary | ICD-10-CM | POA: Diagnosis not present

## 2012-10-15 DIAGNOSIS — Z79899 Other long term (current) drug therapy: Secondary | ICD-10-CM | POA: Diagnosis not present

## 2012-10-15 DIAGNOSIS — E559 Vitamin D deficiency, unspecified: Secondary | ICD-10-CM | POA: Diagnosis not present

## 2012-10-15 DIAGNOSIS — I10 Essential (primary) hypertension: Secondary | ICD-10-CM | POA: Diagnosis not present

## 2012-10-15 DIAGNOSIS — N189 Chronic kidney disease, unspecified: Secondary | ICD-10-CM | POA: Diagnosis not present

## 2012-10-15 DIAGNOSIS — E039 Hypothyroidism, unspecified: Secondary | ICD-10-CM | POA: Diagnosis not present

## 2012-10-16 DIAGNOSIS — I2510 Atherosclerotic heart disease of native coronary artery without angina pectoris: Secondary | ICD-10-CM | POA: Diagnosis not present

## 2012-10-16 DIAGNOSIS — I442 Atrioventricular block, complete: Secondary | ICD-10-CM | POA: Diagnosis not present

## 2012-10-16 DIAGNOSIS — J449 Chronic obstructive pulmonary disease, unspecified: Secondary | ICD-10-CM | POA: Diagnosis not present

## 2012-10-16 DIAGNOSIS — Z45018 Encounter for adjustment and management of other part of cardiac pacemaker: Secondary | ICD-10-CM | POA: Diagnosis not present

## 2012-10-16 LAB — PACEMAKER DEVICE OBSERVATION

## 2012-10-17 ENCOUNTER — Encounter: Payer: Self-pay | Admitting: Cardiovascular Disease

## 2012-11-19 DIAGNOSIS — N189 Chronic kidney disease, unspecified: Secondary | ICD-10-CM | POA: Diagnosis not present

## 2012-11-19 DIAGNOSIS — E039 Hypothyroidism, unspecified: Secondary | ICD-10-CM | POA: Diagnosis not present

## 2012-11-19 DIAGNOSIS — J449 Chronic obstructive pulmonary disease, unspecified: Secondary | ICD-10-CM | POA: Diagnosis not present

## 2012-11-19 DIAGNOSIS — R809 Proteinuria, unspecified: Secondary | ICD-10-CM | POA: Diagnosis not present

## 2012-11-19 DIAGNOSIS — D649 Anemia, unspecified: Secondary | ICD-10-CM | POA: Diagnosis not present

## 2012-12-11 DIAGNOSIS — IMO0002 Reserved for concepts with insufficient information to code with codable children: Secondary | ICD-10-CM | POA: Diagnosis not present

## 2012-12-11 DIAGNOSIS — K7689 Other specified diseases of liver: Secondary | ICD-10-CM | POA: Diagnosis not present

## 2012-12-11 DIAGNOSIS — Z23 Encounter for immunization: Secondary | ICD-10-CM | POA: Diagnosis not present

## 2012-12-30 ENCOUNTER — Emergency Department (HOSPITAL_COMMUNITY)
Admission: EM | Admit: 2012-12-30 | Discharge: 2012-12-30 | Disposition: A | Discharge status: Home / Self Care | Payer: Medicare Other | Attending: Emergency Medicine | Admitting: Emergency Medicine

## 2012-12-30 ENCOUNTER — Encounter (HOSPITAL_COMMUNITY): Payer: Self-pay | Admitting: Emergency Medicine

## 2012-12-30 ENCOUNTER — Emergency Department (HOSPITAL_COMMUNITY): Payer: Medicare Other

## 2012-12-30 DIAGNOSIS — I2510 Atherosclerotic heart disease of native coronary artery without angina pectoris: Secondary | ICD-10-CM | POA: Diagnosis not present

## 2012-12-30 DIAGNOSIS — I10 Essential (primary) hypertension: Secondary | ICD-10-CM | POA: Insufficient documentation

## 2012-12-30 DIAGNOSIS — Z950 Presence of cardiac pacemaker: Secondary | ICD-10-CM | POA: Diagnosis not present

## 2012-12-30 DIAGNOSIS — Z7982 Long term (current) use of aspirin: Secondary | ICD-10-CM | POA: Diagnosis not present

## 2012-12-30 DIAGNOSIS — J441 Chronic obstructive pulmonary disease with (acute) exacerbation: Secondary | ICD-10-CM | POA: Insufficient documentation

## 2012-12-30 DIAGNOSIS — E079 Disorder of thyroid, unspecified: Secondary | ICD-10-CM | POA: Insufficient documentation

## 2012-12-30 DIAGNOSIS — I4891 Unspecified atrial fibrillation: Secondary | ICD-10-CM | POA: Diagnosis not present

## 2012-12-30 DIAGNOSIS — R0602 Shortness of breath: Secondary | ICD-10-CM | POA: Diagnosis not present

## 2012-12-30 DIAGNOSIS — Z79899 Other long term (current) drug therapy: Secondary | ICD-10-CM | POA: Diagnosis not present

## 2012-12-30 DIAGNOSIS — I428 Other cardiomyopathies: Secondary | ICD-10-CM | POA: Diagnosis not present

## 2012-12-30 DIAGNOSIS — M2550 Pain in unspecified joint: Secondary | ICD-10-CM | POA: Diagnosis not present

## 2012-12-30 DIAGNOSIS — J40 Bronchitis, not specified as acute or chronic: Secondary | ICD-10-CM

## 2012-12-30 DIAGNOSIS — I509 Heart failure, unspecified: Secondary | ICD-10-CM | POA: Insufficient documentation

## 2012-12-30 DIAGNOSIS — J449 Chronic obstructive pulmonary disease, unspecified: Secondary | ICD-10-CM

## 2012-12-30 LAB — CBC WITH DIFFERENTIAL/PLATELET
Basophils Relative: 1 % (ref 0–1)
Eosinophils Absolute: 0.4 10*3/uL (ref 0.0–0.7)
MCH: 31.9 pg (ref 26.0–34.0)
MCHC: 33.1 g/dL (ref 30.0–36.0)
Monocytes Relative: 11 % (ref 3–12)
Neutrophils Relative %: 55 % (ref 43–77)
Platelets: 205 10*3/uL (ref 150–400)

## 2012-12-30 LAB — BASIC METABOLIC PANEL WITH GFR
BUN: 24 mg/dL — ABNORMAL HIGH (ref 6–23)
CO2: 29 meq/L (ref 19–32)
Calcium: 10.4 mg/dL (ref 8.4–10.5)
Chloride: 101 meq/L (ref 96–112)
Creatinine, Ser: 1.25 mg/dL — ABNORMAL HIGH (ref 0.50–1.10)
GFR calc Af Amer: 44 mL/min — ABNORMAL LOW
GFR calc non Af Amer: 38 mL/min — ABNORMAL LOW
Glucose, Bld: 114 mg/dL — ABNORMAL HIGH (ref 70–99)
Potassium: 3.8 meq/L (ref 3.5–5.1)
Sodium: 140 meq/L (ref 135–145)

## 2012-12-30 LAB — PRO B NATRIURETIC PEPTIDE: Pro B Natriuretic peptide (BNP): 264.1 pg/mL (ref 0–450)

## 2012-12-30 MED ORDER — DOXYCYCLINE HYCLATE 100 MG PO CAPS: 100.0000 mg | ORAL_CAPSULE | Freq: Two times a day (BID) | ORAL | Status: AC

## 2012-12-30 MED ORDER — ALBUTEROL SULFATE (5 MG/ML) 0.5% IN NEBU
2.5000 mg | INHALATION_SOLUTION | Freq: Once | RESPIRATORY_TRACT | Status: AC
  Administered 2012-12-30: 2.5 mg via RESPIRATORY_TRACT
  Filled 2012-12-30: qty 0.5

## 2012-12-30 MED ORDER — IPRATROPIUM BROMIDE 0.02 % IN SOLN
0.5000 mg | Freq: Once | RESPIRATORY_TRACT | Status: AC
  Administered 2012-12-30: 0.5 mg via RESPIRATORY_TRACT
  Filled 2012-12-30: qty 2.5

## 2012-12-30 MED ORDER — DOXYCYCLINE HYCLATE 100 MG PO TABS
100.0000 mg | ORAL_TABLET | Freq: Once | ORAL | Status: AC
  Administered 2012-12-30: 100 mg via ORAL
  Filled 2012-12-30: qty 1

## 2012-12-30 MED ORDER — PREDNISONE 10 MG PO TABS: ORAL_TABLET | ORAL | Status: DC

## 2012-12-30 MED ORDER — ALBUTEROL SULFATE (5 MG/ML) 0.5% IN NEBU
5.0000 mg | INHALATION_SOLUTION | Freq: Once | RESPIRATORY_TRACT | Status: AC
  Administered 2012-12-30: 5 mg via RESPIRATORY_TRACT
  Filled 2012-12-30: qty 1

## 2012-12-30 MED ORDER — METHYLPREDNISOLONE SODIUM SUCC 125 MG IJ SOLR
125.0000 mg | Freq: Once | INTRAMUSCULAR | Status: AC
  Administered 2012-12-30: 125 mg via INTRAVENOUS
  Filled 2012-12-30: qty 2

## 2012-12-30 NOTE — ED Notes (Addendum)
Patient c/o shortness of breath, wheezing, productive cough with clear sputum. Patient denies any chest pain, swelling, or fevers.

## 2012-12-30 NOTE — ED Provider Notes (Signed)
CSN: [id number]     Arrival date & time 12/30/12  0941 History   First MD Initiated Contact with Patient 12/30/12 [phone]     Chief Complaint  Patient presents with  . Shortness of Breath   (Consider location/radiation/quality/duration/timing/severity/associated sxs/prior Treatment) HPI Comments: Patient is an 79 year old female with a history of hypertension, coronary artery disease, cardiomyopathy, congestive heart failure, sick sinus syndrome with pacemaker, thyroid disease, and paroxysmal atrial fibrillation who presents to the emergency department with complaint of shortness of breath and wheezing. This is been going on for the last 3-4 days. The patient states that at times her cough is productive but mostly clear. She states she has a sensation of not being able to get enough air. She notices more wheezing cough and congestion at night than during the day. She denies having any chest pain or shoulder pain or jaw pain. She denies any unusual sweats. She has not been nauseated,  she has not been vomiting. She denies any recent palpitations.  The history is provided by the patient.   PRIMARY CARE MD - Dr. Phillips Odor. Past Medical History  Diagnosis Date  . Thyroid disease   . Hypertension   . CHF (congestive heart failure)   . Coronary artery disease   . Cardiomyopathy   . Paroxysmal atrial fibrillation   . History of sick sinus syndrome    Past Surgical History  Procedure Laterality Date  . Appendectomy    . Cholecystectomy    . Pacemaker placement    . Knee arthroscopy     Family History  Problem Relation Age of Onset  . Heart disease    . Cancer    . Kidney disease     History  Substance Use Topics  . Smoking status: Never Smoker   . Smokeless tobacco: Never Used  . Alcohol Use: No   OB History   Grav Para Term Preterm Abortions TAB SAB Ect Mult Living            0     Review of Systems  Constitutional: Negative for activity change.       All ROS Neg except as noted  in HPI  HENT: Negative for nosebleeds and neck pain.   Eyes: Negative for photophobia and discharge.  Respiratory: Positive for cough and wheezing. Negative for shortness of breath.   Cardiovascular: Negative for chest pain and palpitations.  Gastrointestinal: Negative for abdominal pain and blood in stool.  Genitourinary: Negative for dysuria, frequency and hematuria.  Musculoskeletal: Positive for arthralgias. Negative for back pain.  Skin: Negative.   Neurological: Negative for dizziness, seizures and speech difficulty.  Psychiatric/Behavioral: Negative for hallucinations and confusion.    Allergies  Macrodantin and Sulfa antibiotics  Home Medications   Current Outpatient Rx  Name  Route  Sig  Dispense  Refill  . aspirin EC 81 MG tablet   Oral   Take 81 mg by mouth at bedtime.         . Calcium Carbonate-Vitamin D (CALCIUM 600+D) 600-400 MG-UNIT per tablet   Oral   Take 2 tablets by mouth daily.         . digoxin (LANOXIN) 0.125 MG tablet   Oral   Take 125 mcg by mouth daily.         Marland Kitchen esomeprazole (NEXIUM) 40 MG capsule   Oral   Take 40 mg by mouth daily before breakfast.         . ezetimibe-simvastatin (VYTORIN) 10-20 MG per tablet  Oral   Take 1 tablet by mouth at bedtime.         . furosemide (LASIX) 20 MG tablet   Oral   Take 20 mg by mouth 2 (two) times a week.          . levocetirizine (XYZAL) 5 MG tablet   Oral   Take 5 mg by mouth every evening.         Marland Kitchen levothyroxine (SYNTHROID, LEVOTHROID) 75 MCG tablet   Oral   Take 75 mcg by mouth daily.         Marland Kitchen lisinopril (PRINIVIL,ZESTRIL) 30 MG tablet   Oral   Take 15 mg by mouth daily.         . Magnesium 250 MG TABS   Oral   Take 1 tablet by mouth daily.         . meclizine (ANTIVERT) 25 MG tablet   Oral   Take 1 tablet (25 mg total) by mouth 4 (four) times daily as needed.   40 tablet   0   . metoprolol succinate (TOPROL-XL) 50 MG 24 hr tablet   Oral   Take 50 mg by  mouth daily. Take with or immediately following a meal.         . Multiple Vitamin (MULITIVITAMIN WITH MINERALS) TABS   Oral   Take 1 tablet by mouth daily.         . ondansetron (ZOFRAN) 4 MG tablet   Oral   Take 1 tablet (4 mg total) by mouth every 6 (six) hours as needed for nausea (or dizziness).   12 tablet   0   . Polyethyl Glycol-Propyl Glycol (SYSTANE OP)   Both Eyes   Place 1 drop into both eyes at bedtime.         . Potassium 99 MG TABS   Oral   Take 1 tablet by mouth daily.         . vitamin B-12 (CYANOCOBALAMIN) 1000 MCG tablet   Oral   Take 1,000 mcg by mouth daily.         . vitamin C (ASCORBIC ACID) 500 MG tablet   Oral   Take 500 mg by mouth daily.         . vitamin E 400 UNIT capsule   Oral   Take 400 Units by mouth daily.          BP 171/59  Pulse 81  Temp(Src) 98.1 F (36.7 C) (Oral)  Resp 22  Ht 5\' 3"  (1.6 m)  Wt 120 lb (54.432 kg)  BMI 21.26 kg/m2  SpO2 91% Physical Exam  Nursing note and vitals reviewed. Constitutional: She is oriented to person, place, and time. She appears well-developed and well-nourished.  Non-toxic appearance.  HENT:  Head: Normocephalic.  Right Ear: Tympanic membrane and external ear normal.  Left Ear: Tympanic membrane and external ear normal.  Eyes: EOM and lids are normal. Pupils are equal, round, and reactive to light.  Neck: Normal range of motion. Neck supple. Carotid bruit is not present.  Cardiovascular: Normal rate, regular rhythm, normal heart sounds, intact distal pulses and normal pulses.  Exam reveals no gallop and no friction rub.   Pulmonary/Chest: No respiratory distress. She has wheezes. She exhibits no tenderness.  There is symmetrical rise and fall of the chest. The patient speaks in complete sentences. There is bilateral rhonchi and wheezes present.  Abdominal: Soft. Bowel sounds are normal. There is no tenderness. There is no guarding.  Musculoskeletal: Normal range of motion.   There is no edema of the lower extremities. There is a negative Homans signs.  Lymphadenopathy:       Head (right side): No submandibular adenopathy present.       Head (left side): No submandibular adenopathy present.    She has no cervical adenopathy.  Neurological: She is alert and oriented to person, place, and time. She has normal strength. No cranial nerve deficit or sensory deficit.  Skin: Skin is warm and dry. No rash noted.  Psychiatric: She has a normal mood and affect. Her speech is normal.    ED Course  Procedures (including critical care time) Labs Review Labs Reviewed  PRO B NATRIURETIC PEPTIDE  CBC WITH DIFFERENTIAL  BASIC METABOLIC PANEL  TROPONIN I   Imaging Review No results found. Pulse oximetry 91% on room air. Within normal limits by my interpretation. MDM  No diagnosis found. **I have reviewed nursing notes, vital signs, and all appropriate lab and imaging results for this patient.* Pt breathing easier after albuterol tx. Test results discussed with patient. Will use a second neb treatment. Natruretic peptide slightly elevated at 264. Complete blood count essentially normal with exception of the hemoglobin being slightly low at 11.5, hematocrit being low at 34.7. The basic metabolic panel shows the BUN to be elevated at 24, the creatinine elevated at 1.25. Troponin was normal at less than 0.30. Digoxin level was normal at 1.2. Portable chest x-ray suggests bronchitis superimposed on advanced emphysematous changes. Patient seen with me by Dr. Patria Mane.  Patient states she has been told that she had chronic lung problems. States that she has these shortness of breath episodes whenever Will a stay in hospital and worked on some shards U. in the to the she" gets a cold. Patient ambulated in the room and [place] without significant deterioration in condition.  Patient advised to continue her current medications, doxycycline twice a day and prednisone taper added to  current medications. Patient is to return to the emergency department if any changes, problems, or concerns.  Kathie Dike, PA-C 01/01/13 [phone]

## 2012-12-30 NOTE — ED Provider Notes (Signed)
ECG interpretation   Date: 12/30/2012  Rate: 75  Rhythm: AV dual paced rhythm   Old EKG Reviewed: No significant changes noted     Lyanne Co, MD 12/30/12 1253

## 2013-01-02 NOTE — ED Provider Notes (Signed)
Medical screening examination/treatment/procedure(s) were conducted as a shared visit with non-physician practitioner(s) and myself.  I personally evaluated the patient during the encounter  Patient is overall well-appearing she feels much better after albuterol.  Patient be discharged home with close PCP followup with standard outpatient therapy.  Patient understands return to the ER for new or worsening symptoms  Lyanne Co, MD 01/02/13 [phone]

## 2013-02-08 DIAGNOSIS — J449 Chronic obstructive pulmonary disease, unspecified: Secondary | ICD-10-CM | POA: Diagnosis not present

## 2013-02-08 DIAGNOSIS — IMO0002 Reserved for concepts with insufficient information to code with codable children: Secondary | ICD-10-CM | POA: Diagnosis not present

## 2013-02-08 DIAGNOSIS — J209 Acute bronchitis, unspecified: Secondary | ICD-10-CM | POA: Diagnosis not present

## 2013-03-18 DIAGNOSIS — J029 Acute pharyngitis, unspecified: Secondary | ICD-10-CM | POA: Diagnosis not present

## 2013-03-18 DIAGNOSIS — IMO0002 Reserved for concepts with insufficient information to code with codable children: Secondary | ICD-10-CM | POA: Diagnosis not present

## 2013-03-18 DIAGNOSIS — J069 Acute upper respiratory infection, unspecified: Secondary | ICD-10-CM | POA: Diagnosis not present

## 2013-04-01 DIAGNOSIS — H5200 Hypermetropia, unspecified eye: Secondary | ICD-10-CM | POA: Diagnosis not present

## 2013-04-01 DIAGNOSIS — H524 Presbyopia: Secondary | ICD-10-CM | POA: Diagnosis not present

## 2013-04-01 DIAGNOSIS — H2589 Other age-related cataract: Secondary | ICD-10-CM | POA: Diagnosis not present

## 2013-04-01 DIAGNOSIS — H52229 Regular astigmatism, unspecified eye: Secondary | ICD-10-CM | POA: Diagnosis not present

## 2013-04-04 DIAGNOSIS — D649 Anemia, unspecified: Secondary | ICD-10-CM | POA: Diagnosis not present

## 2013-04-04 DIAGNOSIS — E039 Hypothyroidism, unspecified: Secondary | ICD-10-CM | POA: Diagnosis not present

## 2013-04-04 DIAGNOSIS — E785 Hyperlipidemia, unspecified: Secondary | ICD-10-CM | POA: Diagnosis not present

## 2013-04-04 DIAGNOSIS — N189 Chronic kidney disease, unspecified: Secondary | ICD-10-CM | POA: Diagnosis not present

## 2013-04-04 DIAGNOSIS — Z79899 Other long term (current) drug therapy: Secondary | ICD-10-CM | POA: Diagnosis not present

## 2013-04-10 DIAGNOSIS — I10 Essential (primary) hypertension: Secondary | ICD-10-CM | POA: Diagnosis not present

## 2013-04-10 DIAGNOSIS — N183 Chronic kidney disease, stage 3 unspecified: Secondary | ICD-10-CM | POA: Diagnosis not present

## 2013-07-10 DIAGNOSIS — M461 Sacroiliitis, not elsewhere classified: Secondary | ICD-10-CM | POA: Diagnosis not present

## 2013-07-10 DIAGNOSIS — N183 Chronic kidney disease, stage 3 unspecified: Secondary | ICD-10-CM | POA: Diagnosis not present

## 2013-07-10 DIAGNOSIS — E039 Hypothyroidism, unspecified: Secondary | ICD-10-CM | POA: Diagnosis not present

## 2013-07-10 DIAGNOSIS — IMO0002 Reserved for concepts with insufficient information to code with codable children: Secondary | ICD-10-CM | POA: Diagnosis not present

## 2013-07-18 ENCOUNTER — Telehealth: Payer: Self-pay | Admitting: *Deleted

## 2013-07-18 NOTE — Telephone Encounter (Signed)
Needs paper chartc

## 2013-07-18 NOTE — Telephone Encounter (Signed)
Please schedule pt for first available appt for Physician Pacer Check and to establish care w/ new cardiologist.  Last OV was in July 2014 w/ Dr. Rollene Fare.

## 2013-07-18 NOTE — Telephone Encounter (Signed)
Pt was calling in regards to getting her pacemaker checked. Former RAW, no recall in the system  [facility]

## 2013-07-24 DIAGNOSIS — N183 Chronic kidney disease, stage 3 unspecified: Secondary | ICD-10-CM | POA: Diagnosis not present

## 2013-07-24 DIAGNOSIS — I10 Essential (primary) hypertension: Secondary | ICD-10-CM | POA: Diagnosis not present

## 2013-07-24 DIAGNOSIS — D649 Anemia, unspecified: Secondary | ICD-10-CM | POA: Diagnosis not present

## 2013-07-24 DIAGNOSIS — E039 Hypothyroidism, unspecified: Secondary | ICD-10-CM | POA: Diagnosis not present

## 2013-08-02 ENCOUNTER — Telehealth: Payer: Self-pay | Admitting: *Deleted

## 2013-08-02 NOTE — Telephone Encounter (Signed)
Encounter Closed---5/8 TP 

## 2013-08-14 ENCOUNTER — Ambulatory Visit (INDEPENDENT_AMBULATORY_CARE_PROVIDER_SITE_OTHER): Payer: Medicare Other | Admitting: *Deleted

## 2013-08-14 DIAGNOSIS — I441 Atrioventricular block, second degree: Secondary | ICD-10-CM | POA: Diagnosis not present

## 2013-08-14 LAB — MDC_IDC_ENUM_SESS_TYPE_INCLINIC
Battery Impedance: 254 Ohm
Battery Remaining Longevity: 112 mo
Battery Voltage: 2.8 V
Lead Channel Pacing Threshold Amplitude: 0.5 V
Lead Channel Pacing Threshold Pulse Width: 0.4 ms
Lead Channel Setting Pacing Amplitude: 1.5 V
Lead Channel Setting Pacing Amplitude: 2 V
Lead Channel Setting Pacing Pulse Width: 0.4 ms
MDC IDC MSMT LEADCHNL RA IMPEDANCE VALUE: 521 Ohm
MDC IDC MSMT LEADCHNL RA PACING THRESHOLD PULSEWIDTH: 0.4 ms
MDC IDC MSMT LEADCHNL RA SENSING INTR AMPL: 2.8 mV
MDC IDC MSMT LEADCHNL RV IMPEDANCE VALUE: 741 Ohm
MDC IDC MSMT LEADCHNL RV PACING THRESHOLD AMPLITUDE: 0.75 V
MDC IDC SESS DTM: 20150520162201
MDC IDC SET LEADCHNL RV SENSING SENSITIVITY: 2.8 mV
MDC IDC STAT BRADY AP VP PERCENT: 90 %
MDC IDC STAT BRADY AP VS PERCENT: 0 %
MDC IDC STAT BRADY AS VP PERCENT: 10 %
MDC IDC STAT BRADY AS VS PERCENT: 0 %

## 2013-08-14 NOTE — Progress Notes (Signed)
Pacemaker check in clinic. Normal device function. Thresholds, sensing, impedances consistent with previous measurements. Device programmed to maximize longevity. 118 mode switches <0.1%.  3 AHR episodes the longest 50 minutes, - coumadin.  No high ventricular rates noted. Device programmed at appropriate safety margins. Histogram distribution appropriate for patient activity level. Device programmed to optimize intrinsic conduction. Estimated longevity 9.5 years. Patient enrolled in remote follow-up/TTM's with [employer]. Plan to follow every 3 months remotely and see annually in office. Patient education completed.  ROV in August with Dr. Lovena Le in [place].

## 2013-08-26 DIAGNOSIS — Z85828 Personal history of other malignant neoplasm of skin: Secondary | ICD-10-CM | POA: Diagnosis not present

## 2013-08-26 DIAGNOSIS — L821 Other seborrheic keratosis: Secondary | ICD-10-CM | POA: Diagnosis not present

## 2013-09-18 DIAGNOSIS — E039 Hypothyroidism, unspecified: Secondary | ICD-10-CM | POA: Diagnosis not present

## 2013-09-18 DIAGNOSIS — E785 Hyperlipidemia, unspecified: Secondary | ICD-10-CM | POA: Diagnosis not present

## 2013-09-18 DIAGNOSIS — IMO0002 Reserved for concepts with insufficient information to code with codable children: Secondary | ICD-10-CM | POA: Diagnosis not present

## 2013-09-18 DIAGNOSIS — E781 Pure hyperglyceridemia: Secondary | ICD-10-CM | POA: Diagnosis not present

## 2013-09-21 IMAGING — CR DG CHEST 2V
2 series · 2 of 2 positions shown · non-contrast
Comparison: 09/14/2011 outside exam from [facility] [employer] [facility]

CLINICAL DATA: Cough, shortness of breath, question infiltrate,
history CHF

CHEST - 2 VIEW

[view not recorded (1 of 2)]
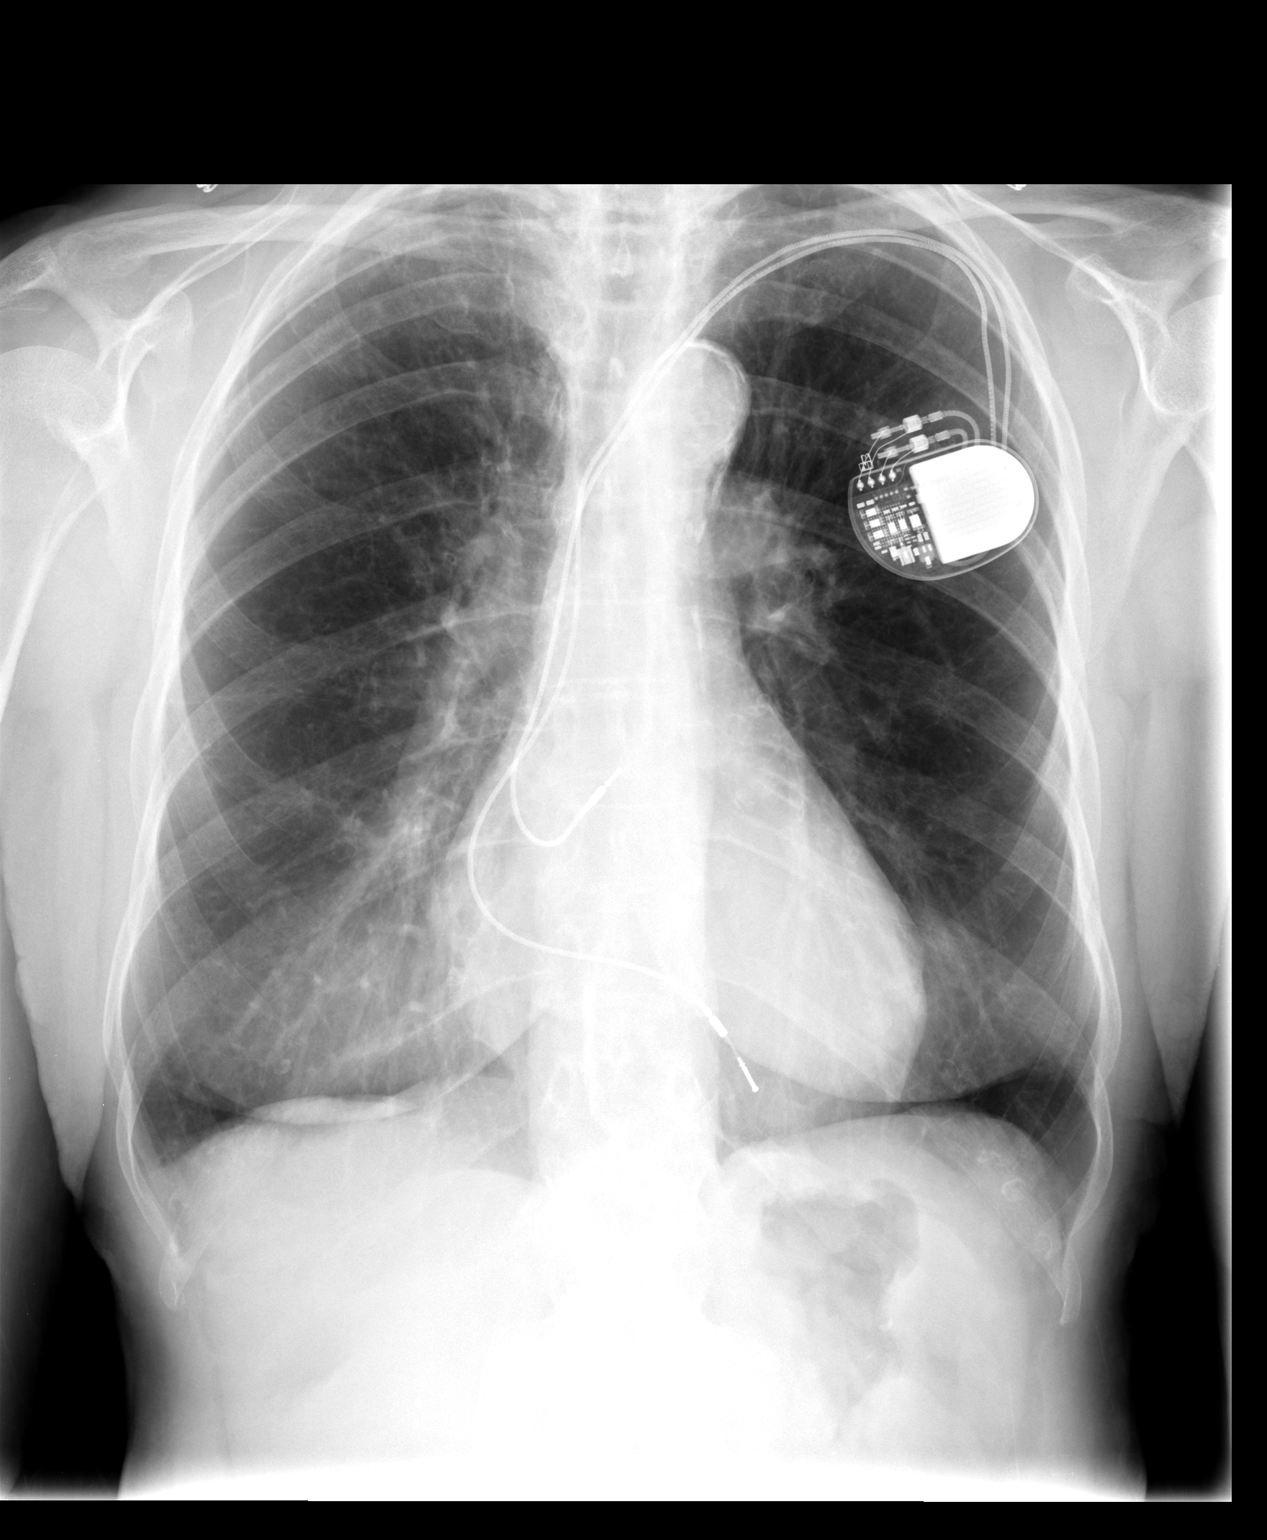

[view not recorded (2 of 2)]
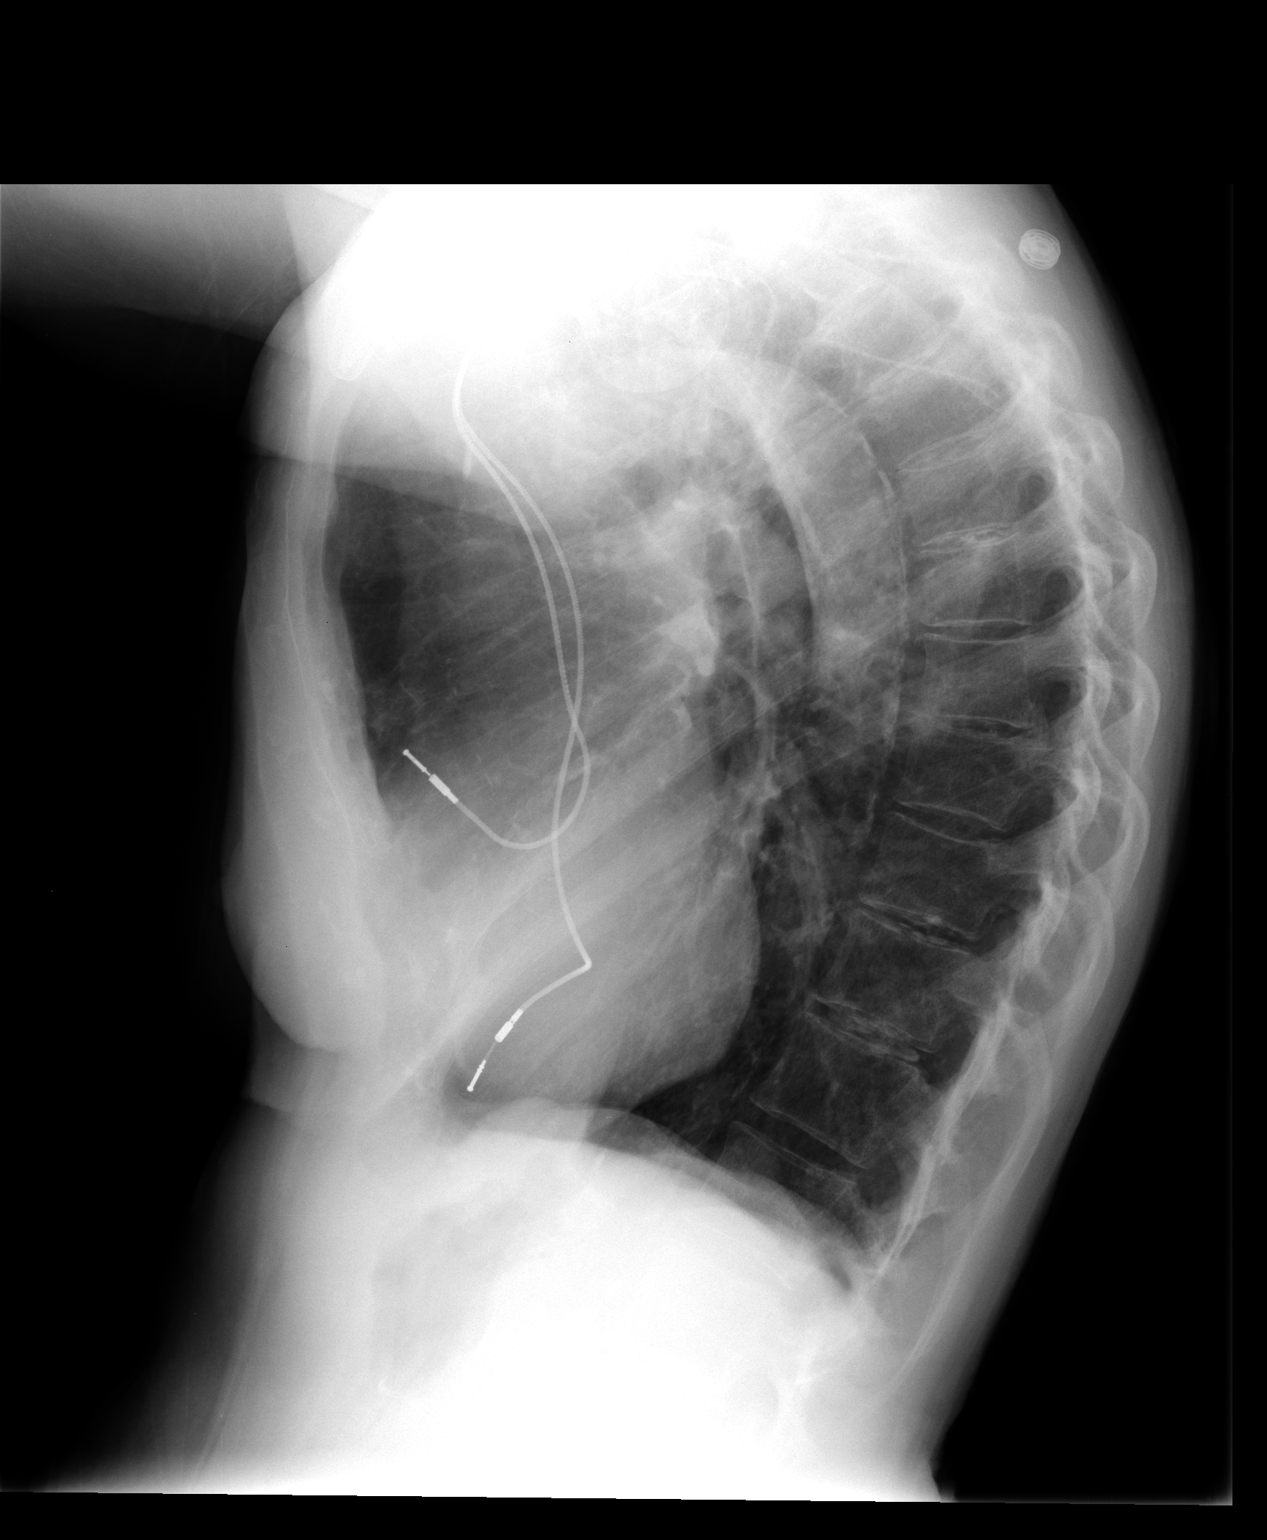

[2 of 2 positions shown; findings below may reference images not displayed]

FINDINGS: Left subclavian sequential transvenous pacemaker leads project at
right atrium and right ventricle.
Upper-normal size of cardiac silhouette.
Calcified thoracic aorta.
Mediastinal contours and pulmonary vascularity normal.
Lungs are emphysematous but clear.
No pleural effusion or pneumothorax.
Pectus deformity of osseous demineralization noted.
IMPRESSION: Upper normal-sized cardiac silhouette post pacemaker.
Emphysematous changes.
No acute abnormalities.

## 2013-09-30 ENCOUNTER — Ambulatory Visit (INDEPENDENT_AMBULATORY_CARE_PROVIDER_SITE_OTHER): Payer: Medicare Other | Admitting: Internal Medicine

## 2013-09-30 ENCOUNTER — Encounter: Payer: Self-pay | Admitting: Internal Medicine

## 2013-09-30 VITALS — BP 139/63 | HR 82 | Ht 63.0 in | Wt 117.4 lb

## 2013-09-30 DIAGNOSIS — I10 Essential (primary) hypertension: Secondary | ICD-10-CM | POA: Insufficient documentation

## 2013-09-30 DIAGNOSIS — I441 Atrioventricular block, second degree: Secondary | ICD-10-CM | POA: Diagnosis not present

## 2013-09-30 DIAGNOSIS — Z950 Presence of cardiac pacemaker: Secondary | ICD-10-CM | POA: Diagnosis not present

## 2013-09-30 LAB — MDC_IDC_ENUM_SESS_TYPE_INCLINIC
Battery Impedance: 303 Ohm
Battery Remaining Longevity: 107 mo
Battery Voltage: 2.8 V
Brady Statistic AP VP Percent: 85 %
Brady Statistic AS VP Percent: 15 %
Lead Channel Impedance Value: 514 Ohm
Lead Channel Pacing Threshold Amplitude: 0.75 V
Lead Channel Sensing Intrinsic Amplitude: 2 mV
Lead Channel Setting Pacing Amplitude: 1.5 V
Lead Channel Setting Pacing Amplitude: 2 V
Lead Channel Setting Pacing Pulse Width: 0.4 ms
MDC IDC MSMT LEADCHNL RA PACING THRESHOLD AMPLITUDE: 0.5 V
MDC IDC MSMT LEADCHNL RA PACING THRESHOLD PULSEWIDTH: 0.4 ms
MDC IDC MSMT LEADCHNL RV IMPEDANCE VALUE: 724 Ohm
MDC IDC MSMT LEADCHNL RV PACING THRESHOLD PULSEWIDTH: 0.4 ms
MDC IDC SESS DTM: 20150706105508
MDC IDC SET LEADCHNL RV SENSING SENSITIVITY: 2.8 mV
MDC IDC STAT BRADY AP VS PERCENT: 0 %
MDC IDC STAT BRADY AS VS PERCENT: 0 %

## 2013-09-30 NOTE — Progress Notes (Signed)
HPI [name] is referred today by Dr. Sallyanne Kuster for ongoing evaluation and management of her DDD PM. She has a h/o symptomatic bradycardia and underwent PPM insertion in 2003 with generator change in 2011. She has been bothered by fatigue and weakness and notes that her thyroid function has been abnormal and is under treatment by Dr. Hilma Favors. She admits to being very sedentary. No chest pain or sob. Minimal peripheral edema.  Allergies  Allergen Reactions  . Macrodantin Shortness Of Breath  . Sulfa Antibiotics Shortness Of Breath     Current Outpatient Prescriptions  Medication Sig Dispense Refill  . aspirin EC 81 MG tablet Take 81 mg by mouth at bedtime.      . Calcium Carbonate-Vitamin D (CALCIUM 600+D) 600-400 MG-UNIT per tablet Take 2 tablets by mouth daily.      . digoxin (LANOXIN) 0.125 MG tablet Take 125 mcg by mouth every morning.       Marland Kitchen esomeprazole (NEXIUM) 40 MG capsule Take 40 mg by mouth daily before breakfast.      . ezetimibe-simvastatin (VYTORIN) 10-20 MG per tablet Take 1 tablet by mouth at bedtime.      . furosemide (LASIX) 20 MG tablet Take 20 mg by mouth 2 (two) times a week.       . levocetirizine (XYZAL) 5 MG tablet Take 5 mg by mouth every morning.       Marland Kitchen levothyroxine (SYNTHROID, LEVOTHROID) 75 MCG tablet Take 75 mcg by mouth daily before breakfast.       . lisinopril (PRINIVIL,ZESTRIL) 30 MG tablet Take 15 mg by mouth every evening.       . Magnesium 250 MG TABS Take 1 tablet by mouth every morning.       . meclizine (ANTIVERT) 25 MG tablet Take 1 tablet (25 mg total) by mouth 4 (four) times daily as needed.  40 tablet  0  . metoprolol succinate (TOPROL-XL) 50 MG 24 hr tablet Take 50 mg by mouth daily. Take with or immediately following a meal.      . Multiple Vitamin (MULITIVITAMIN WITH MINERALS) TABS Take 1 tablet by mouth every morning.       Vladimir Faster Glycol-Propyl Glycol (SYSTANE OP) Place 1 drop into both eyes at bedtime.      . Potassium 99 MG  TABS Take 1 tablet by mouth 2 (two) times daily.       . vitamin B-12 (CYANOCOBALAMIN) 1000 MCG tablet Take 1,000 mcg by mouth every morning.       . vitamin C (ASCORBIC ACID) 500 MG tablet Take 500 mg by mouth every morning.        No current facility-administered medications for this visit.     Past Medical History  Diagnosis Date  . Thyroid disease   . Hypertension   . CHF (congestive heart failure)   . Coronary artery disease   . Cardiomyopathy   . Paroxysmal atrial fibrillation   . History of sick sinus syndrome     ROS:   All systems reviewed and negative except as noted in the HPI.   Past Surgical History  Procedure Laterality Date  . Appendectomy    . Cholecystectomy    . Pacemaker placement    . Knee arthroscopy       Family History  Problem Relation Age of Onset  . Heart disease    . Cancer    . Kidney disease       History   Social  History  . Marital Status: Widowed    Spouse Name: N/A    Number of Children: N/A  . Years of Education: N/A   Occupational History  . Not on file.   Social History Main Topics  . Smoking status: Never Smoker   . Smokeless tobacco: Never Used  . Alcohol Use: No  . Drug Use: No  . Sexual Activity: Yes    Birth Control/ Protection: Post-menopausal   Other Topics Concern  . Not on file   Social History Narrative  . No narrative on file     BP 139/63  Pulse 82  Ht 5\' 3"  (1.6 m)  Wt 117 lb 6.4 oz (53.252 kg)  BMI 20.80 kg/m2  Physical Exam:  Well appearing elderly woman, NAD HEENT: Unremarkable Neck:  6 cm JVD, no thyromegally Back:  No CVA tenderness Lungs:  Clear with no wheezes. Well healed PPM incision. HEART:  Regular rate rhythm, no murmurs, no rubs, no clicks Abd:  soft, positive bowel sounds, no organomegally, no rebound, no guarding Ext:  2 plus pulses, trace peripheral edema, no cyanosis, no clubbing Skin:  No rashes no nodules Neuro:  CN II through XII intact, motor grossly  intact  DEVICE  Normal device function.  See PaceArt for details.   Assess/Plan:

## 2013-09-30 NOTE — Assessment & Plan Note (Signed)
Her [employer] DDD PM is working normally. Will recheck in several months.  

## 2013-09-30 NOTE — Patient Instructions (Signed)
Your physician wants you to follow-up in: 6 months in the device clinic with Nevin Bloodgood and 1 year with Dr. Lovena Le. You will receive a reminder letter in the mail two months in advance. If you don't receive a letter, please call our office to schedule the follow-up appointment.  Your physician recommends that you continue on your current medications as directed. Please refer to the Current Medication list given to you today.  Thank you for choosing [facility]!!

## 2013-09-30 NOTE — Assessment & Plan Note (Signed)
Her blood pressure is reasonably well controlled. No change in meds. I have encouraged her to reduce her sodium intake.

## 2013-10-07 ENCOUNTER — Encounter: Payer: Self-pay | Admitting: Internal Medicine

## 2013-10-09 ENCOUNTER — Encounter: Payer: Self-pay | Admitting: Cardiovascular Disease

## 2013-10-09 DIAGNOSIS — L821 Other seborrheic keratosis: Secondary | ICD-10-CM | POA: Diagnosis not present

## 2013-10-18 ENCOUNTER — Ambulatory Visit: Payer: Medicare Other | Admitting: Cardiovascular Disease

## 2013-11-15 ENCOUNTER — Encounter: Payer: Medicare Other | Admitting: Internal Medicine

## 2013-11-15 DIAGNOSIS — J209 Acute bronchitis, unspecified: Secondary | ICD-10-CM | POA: Diagnosis not present

## 2013-11-15 DIAGNOSIS — IMO0002 Reserved for concepts with insufficient information to code with codable children: Secondary | ICD-10-CM | POA: Diagnosis not present

## 2013-11-25 DIAGNOSIS — R809 Proteinuria, unspecified: Secondary | ICD-10-CM | POA: Diagnosis not present

## 2013-11-25 DIAGNOSIS — E559 Vitamin D deficiency, unspecified: Secondary | ICD-10-CM | POA: Diagnosis not present

## 2013-11-25 DIAGNOSIS — Z79899 Other long term (current) drug therapy: Secondary | ICD-10-CM | POA: Diagnosis not present

## 2013-11-25 DIAGNOSIS — N189 Chronic kidney disease, unspecified: Secondary | ICD-10-CM | POA: Diagnosis not present

## 2013-11-25 DIAGNOSIS — I10 Essential (primary) hypertension: Secondary | ICD-10-CM | POA: Diagnosis not present

## 2013-11-25 DIAGNOSIS — D649 Anemia, unspecified: Secondary | ICD-10-CM | POA: Diagnosis not present

## 2013-11-27 DIAGNOSIS — N183 Chronic kidney disease, stage 3 unspecified: Secondary | ICD-10-CM | POA: Diagnosis not present

## 2013-11-27 DIAGNOSIS — I10 Essential (primary) hypertension: Secondary | ICD-10-CM | POA: Diagnosis not present

## 2013-11-27 DIAGNOSIS — R809 Proteinuria, unspecified: Secondary | ICD-10-CM | POA: Diagnosis not present

## 2013-12-18 DIAGNOSIS — J111 Influenza due to unidentified influenza virus with other respiratory manifestations: Secondary | ICD-10-CM | POA: Diagnosis not present

## 2013-12-18 DIAGNOSIS — J392 Other diseases of pharynx: Secondary | ICD-10-CM | POA: Diagnosis not present

## 2013-12-18 DIAGNOSIS — J0100 Acute maxillary sinusitis, unspecified: Secondary | ICD-10-CM | POA: Diagnosis not present

## 2014-01-17 DIAGNOSIS — Z23 Encounter for immunization: Secondary | ICD-10-CM | POA: Diagnosis not present

## 2014-01-29 DIAGNOSIS — Z6821 Body mass index (BMI) 21.0-21.9, adult: Secondary | ICD-10-CM | POA: Diagnosis not present

## 2014-01-29 DIAGNOSIS — J449 Chronic obstructive pulmonary disease, unspecified: Secondary | ICD-10-CM | POA: Diagnosis not present

## 2014-01-29 DIAGNOSIS — I509 Heart failure, unspecified: Secondary | ICD-10-CM | POA: Diagnosis not present

## 2014-01-29 DIAGNOSIS — R0600 Dyspnea, unspecified: Secondary | ICD-10-CM | POA: Diagnosis not present

## 2014-01-30 DIAGNOSIS — J449 Chronic obstructive pulmonary disease, unspecified: Secondary | ICD-10-CM | POA: Diagnosis not present

## 2014-03-25 DIAGNOSIS — R0600 Dyspnea, unspecified: Secondary | ICD-10-CM | POA: Diagnosis not present

## 2014-03-25 DIAGNOSIS — Z6821 Body mass index (BMI) 21.0-21.9, adult: Secondary | ICD-10-CM | POA: Diagnosis not present

## 2014-03-25 DIAGNOSIS — J449 Chronic obstructive pulmonary disease, unspecified: Secondary | ICD-10-CM | POA: Diagnosis not present

## 2014-03-31 ENCOUNTER — Ambulatory Visit (INDEPENDENT_AMBULATORY_CARE_PROVIDER_SITE_OTHER): Payer: Medicare Other | Admitting: *Deleted

## 2014-03-31 DIAGNOSIS — I442 Atrioventricular block, complete: Secondary | ICD-10-CM | POA: Diagnosis not present

## 2014-03-31 LAB — MDC_IDC_ENUM_SESS_TYPE_INCLINIC
Battery Impedance: 327 Ohm
Battery Remaining Longevity: 106 mo
Battery Voltage: 2.8 V
Date Time Interrogation Session: 20160104132042
Lead Channel Impedance Value: 590 Ohm
Lead Channel Pacing Threshold Amplitude: 0.75 V
Lead Channel Pacing Threshold Pulse Width: 0.4 ms
Lead Channel Setting Pacing Amplitude: 2 V
Lead Channel Setting Sensing Sensitivity: 2.8 mV
MDC IDC MSMT LEADCHNL RA PACING THRESHOLD AMPLITUDE: 0.75 V
MDC IDC MSMT LEADCHNL RA PACING THRESHOLD PULSEWIDTH: 0.4 ms
MDC IDC MSMT LEADCHNL RA SENSING INTR AMPL: 4 mV
MDC IDC MSMT LEADCHNL RV IMPEDANCE VALUE: 789 Ohm
MDC IDC SET LEADCHNL RA PACING AMPLITUDE: 1.5 V
MDC IDC SET LEADCHNL RV PACING PULSEWIDTH: 0.4 ms
MDC IDC STAT BRADY AP VP PERCENT: 87 %
MDC IDC STAT BRADY AP VS PERCENT: 0 %
MDC IDC STAT BRADY AS VP PERCENT: 13 %
MDC IDC STAT BRADY AS VS PERCENT: 0 %

## 2014-03-31 NOTE — Progress Notes (Signed)
PPM check in office. 

## 2014-04-11 DIAGNOSIS — Z6820 Body mass index (BMI) 20.0-20.9, adult: Secondary | ICD-10-CM | POA: Diagnosis not present

## 2014-04-11 DIAGNOSIS — N342 Other urethritis: Secondary | ICD-10-CM | POA: Diagnosis not present

## 2014-04-23 ENCOUNTER — Encounter: Payer: Self-pay | Admitting: Internal Medicine

## 2014-04-28 ENCOUNTER — Other Ambulatory Visit (HOSPITAL_COMMUNITY): Payer: Self-pay | Admitting: Family Medicine

## 2014-04-28 ENCOUNTER — Ambulatory Visit (HOSPITAL_COMMUNITY)
Admission: RE | Admit: 2014-04-28 | Discharge: 2014-04-28 | Disposition: A | Discharge status: Home / Self Care | Payer: Medicare Other | Source: Ambulatory Visit | Attending: Family Medicine | Admitting: Family Medicine

## 2014-04-28 DIAGNOSIS — R05 Cough: Secondary | ICD-10-CM | POA: Insufficient documentation

## 2014-04-28 DIAGNOSIS — Z6820 Body mass index (BMI) 20.0-20.9, adult: Secondary | ICD-10-CM | POA: Diagnosis not present

## 2014-04-28 DIAGNOSIS — J209 Acute bronchitis, unspecified: Secondary | ICD-10-CM

## 2014-04-28 DIAGNOSIS — I517 Cardiomegaly: Secondary | ICD-10-CM | POA: Diagnosis not present

## 2014-04-28 DIAGNOSIS — R062 Wheezing: Secondary | ICD-10-CM | POA: Insufficient documentation

## 2014-04-28 DIAGNOSIS — J929 Pleural plaque without asbestos: Secondary | ICD-10-CM | POA: Diagnosis not present

## 2014-04-28 DIAGNOSIS — N342 Other urethritis: Secondary | ICD-10-CM | POA: Diagnosis not present

## 2014-06-03 DIAGNOSIS — Z79899 Other long term (current) drug therapy: Secondary | ICD-10-CM | POA: Diagnosis not present

## 2014-06-03 DIAGNOSIS — R809 Proteinuria, unspecified: Secondary | ICD-10-CM | POA: Diagnosis not present

## 2014-06-03 DIAGNOSIS — N183 Chronic kidney disease, stage 3 (moderate): Secondary | ICD-10-CM | POA: Diagnosis not present

## 2014-06-03 DIAGNOSIS — I10 Essential (primary) hypertension: Secondary | ICD-10-CM | POA: Diagnosis not present

## 2014-06-03 DIAGNOSIS — D509 Iron deficiency anemia, unspecified: Secondary | ICD-10-CM | POA: Diagnosis not present

## 2014-06-03 DIAGNOSIS — E559 Vitamin D deficiency, unspecified: Secondary | ICD-10-CM | POA: Diagnosis not present

## 2014-06-11 DIAGNOSIS — R300 Dysuria: Secondary | ICD-10-CM | POA: Diagnosis not present

## 2014-06-11 DIAGNOSIS — N183 Chronic kidney disease, stage 3 (moderate): Secondary | ICD-10-CM | POA: Diagnosis not present

## 2014-06-11 DIAGNOSIS — R809 Proteinuria, unspecified: Secondary | ICD-10-CM | POA: Diagnosis not present

## 2014-06-11 DIAGNOSIS — I10 Essential (primary) hypertension: Secondary | ICD-10-CM | POA: Diagnosis not present

## 2014-06-13 DIAGNOSIS — H25813 Combined forms of age-related cataract, bilateral: Secondary | ICD-10-CM | POA: Diagnosis not present

## 2014-06-30 ENCOUNTER — Ambulatory Visit (INDEPENDENT_AMBULATORY_CARE_PROVIDER_SITE_OTHER): Payer: Medicare Other | Admitting: *Deleted

## 2014-06-30 DIAGNOSIS — I442 Atrioventricular block, complete: Secondary | ICD-10-CM | POA: Diagnosis not present

## 2014-06-30 LAB — MDC_IDC_ENUM_SESS_TYPE_REMOTE
Battery Impedance: 376 Ohm
Brady Statistic AP VS Percent: 0 %
Lead Channel Impedance Value: 537 Ohm
Lead Channel Pacing Threshold Amplitude: 0.5 V
Lead Channel Pacing Threshold Pulse Width: 0.4 ms
Lead Channel Setting Pacing Amplitude: 2 V
Lead Channel Setting Pacing Pulse Width: 0.4 ms
MDC IDC MSMT BATTERY REMAINING LONGEVITY: 99 mo
MDC IDC MSMT BATTERY VOLTAGE: 2.8 V
MDC IDC MSMT LEADCHNL RV IMPEDANCE VALUE: 720 Ohm
MDC IDC MSMT LEADCHNL RV PACING THRESHOLD AMPLITUDE: 0.625 V
MDC IDC MSMT LEADCHNL RV PACING THRESHOLD PULSEWIDTH: 0.4 ms
MDC IDC SESS DTM: 20160404114956
MDC IDC SET LEADCHNL RA PACING AMPLITUDE: 1.5 V
MDC IDC SET LEADCHNL RV SENSING SENSITIVITY: 2.8 mV
MDC IDC STAT BRADY AP VP PERCENT: 88 %
MDC IDC STAT BRADY AS VP PERCENT: 12 %
MDC IDC STAT BRADY AS VS PERCENT: 0 %

## 2014-06-30 NOTE — Progress Notes (Signed)
Remote pacemaker transmission.   

## 2014-07-04 DIAGNOSIS — J069 Acute upper respiratory infection, unspecified: Secondary | ICD-10-CM | POA: Diagnosis not present

## 2014-07-04 DIAGNOSIS — J209 Acute bronchitis, unspecified: Secondary | ICD-10-CM | POA: Diagnosis not present

## 2014-07-04 DIAGNOSIS — Z6821 Body mass index (BMI) 21.0-21.9, adult: Secondary | ICD-10-CM | POA: Diagnosis not present

## 2014-07-09 DIAGNOSIS — J180 Bronchopneumonia, unspecified organism: Secondary | ICD-10-CM | POA: Diagnosis not present

## 2014-07-09 DIAGNOSIS — R0602 Shortness of breath: Secondary | ICD-10-CM | POA: Diagnosis not present

## 2014-07-09 DIAGNOSIS — Z6820 Body mass index (BMI) 20.0-20.9, adult: Secondary | ICD-10-CM | POA: Diagnosis not present

## 2014-07-10 ENCOUNTER — Encounter: Payer: Self-pay | Admitting: Cardiology

## 2014-07-15 ENCOUNTER — Encounter: Payer: Self-pay | Admitting: Internal Medicine

## 2014-08-11 DIAGNOSIS — H25811 Combined forms of age-related cataract, right eye: Secondary | ICD-10-CM | POA: Diagnosis not present

## 2014-08-11 DIAGNOSIS — H3531 Nonexudative age-related macular degeneration: Secondary | ICD-10-CM | POA: Diagnosis not present

## 2014-08-11 DIAGNOSIS — H25813 Combined forms of age-related cataract, bilateral: Secondary | ICD-10-CM | POA: Diagnosis not present

## 2014-08-13 ENCOUNTER — Other Ambulatory Visit: Payer: Self-pay

## 2014-08-13 ENCOUNTER — Encounter (HOSPITAL_COMMUNITY)
Admission: RE | Admit: 2014-08-13 | Discharge: 2014-08-13 | Disposition: A | Discharge status: Home / Self Care | Payer: Medicare Other | Source: Ambulatory Visit | Attending: Ophthalmology | Admitting: Ophthalmology

## 2014-08-13 ENCOUNTER — Encounter (HOSPITAL_COMMUNITY): Payer: Self-pay

## 2014-08-13 DIAGNOSIS — H269 Unspecified cataract: Secondary | ICD-10-CM | POA: Insufficient documentation

## 2014-08-13 DIAGNOSIS — Z01810 Encounter for preprocedural cardiovascular examination: Secondary | ICD-10-CM | POA: Insufficient documentation

## 2014-08-13 HISTORY — DX: Gastro-esophageal reflux disease without esophagitis: K21.9

## 2014-08-13 HISTORY — DX: Hypothyroidism, unspecified: E03.9

## 2014-08-13 HISTORY — DX: Reserved for inherently not codable concepts without codable children: IMO0001

## 2014-08-13 LAB — BASIC METABOLIC PANEL
ANION GAP: 8 (ref 5–15)
BUN: 26 mg/dL — ABNORMAL HIGH (ref 6–20)
CALCIUM: 10.3 mg/dL (ref 8.9–10.3)
CHLORIDE: 101 mmol/L (ref 101–111)
CO2: 31 mmol/L (ref 22–32)
CREATININE: 1.18 mg/dL — AB (ref 0.44–1.00)
GFR calc Af Amer: 46 mL/min — ABNORMAL LOW (ref >60)
GFR calc non Af Amer: 40 mL/min — ABNORMAL LOW (ref >60)
Glucose, Bld: 96 mg/dL (ref 65–99)
Potassium: 4.5 mmol/L (ref 3.5–5.1)
SODIUM: 140 mmol/L (ref 135–145)

## 2014-08-13 LAB — CBC WITH DIFFERENTIAL/PLATELET
Basophils Absolute: 0.1 10*3/uL (ref 0.0–0.1)
Basophils Relative: 1 % (ref 0–1)
EOS ABS: 0.2 10*3/uL (ref 0.0–0.7)
Eosinophils Relative: 3 % (ref 0–5)
HEMATOCRIT: 37.7 % (ref 36.0–46.0)
Hemoglobin: 12.3 g/dL (ref 12.0–15.0)
Lymphocytes Relative: 29 % (ref 12–46)
Lymphs Abs: 2.5 10*3/uL (ref 0.7–4.0)
MCH: 31.4 pg (ref 26.0–34.0)
MCHC: 32.6 g/dL (ref 30.0–36.0)
MCV: 96.2 fL (ref 78.0–100.0)
MONOS PCT: 7 % (ref 3–12)
Monocytes Absolute: 0.6 10*3/uL (ref 0.1–1.0)
NEUTROS PCT: 60 % (ref 43–77)
Neutro Abs: 5.3 10*3/uL (ref 1.7–7.7)
PLATELETS: 216 10*3/uL (ref 150–400)
RBC: 3.92 MIL/uL (ref 3.87–5.11)
RDW: 13.2 % (ref 11.5–15.5)
WBC: 8.7 10*3/uL (ref 4.0–10.5)

## 2014-08-13 NOTE — Patient Instructions (Signed)
Your procedure is scheduled on: 08/21/2014  Report to [facility] at  1100  AM.  Call this number if you have problems the morning of surgery: [phone]   Do not eat food or drink liquids :After Midnight.      Take these medicines the morning of surgery with A SIP OF WATER: lanoxin, nexium, xyzal, levothyroxine, lisinopril, antivert, metoprolol.   Do not wear jewelry, make-up or nail polish.  Do not wear lotions, powders, or perfumes. You may wear deodorant.  Do not shave 48 hours prior to surgery.  Do not bring valuables to the hospital.  Contacts, dentures or bridgework may not be worn into surgery.  Leave suitcase in the car. After surgery it may be brought to your room.  For patients admitted to the hospital, checkout time is 11:00 AM the day of discharge.   Patients discharged the day of surgery will not be allowed to drive home.  :     Please read over the following fact sheets that you were given: Coughing and Deep Breathing, Surgical Site Infection Prevention, Anesthesia Post-op Instructions and Care and Recovery After Surgery    Cataract A cataract is a clouding of the lens of the eye. When a lens becomes cloudy, vision is reduced based on the degree and nature of the clouding. Many cataracts reduce vision to some degree. Some cataracts make people more near-sighted as they develop. Other cataracts increase glare. Cataracts that are ignored and become worse can sometimes look white. The white color can be seen through the pupil. CAUSES   Aging. However, cataracts may occur at any age, even in newborns.   Certain drugs.   Trauma to the eye.   Certain diseases such as diabetes.   Specific eye diseases such as chronic inflammation inside the eye or a sudden attack of a rare form of glaucoma.   Inherited or acquired medical problems.  SYMPTOMS   Gradual, progressive drop in vision in the affected eye.   Severe, rapid visual loss. This most often happens when trauma is the  cause.  DIAGNOSIS  To detect a cataract, an eye doctor examines the lens. Cataracts are best diagnosed with an exam of the eyes with the pupils enlarged (dilated) by drops.  TREATMENT  For an early cataract, vision may improve by using different eyeglasses or stronger lighting. If that does not help your vision, surgery is the only effective treatment. A cataract needs to be surgically removed when vision loss interferes with your everyday activities, such as driving, reading, or watching TV. A cataract may also have to be removed if it prevents examination or treatment of another eye problem. Surgery removes the cloudy lens and usually replaces it with a substitute lens (intraocular lens, IOL).  At a time when both you and your doctor agree, the cataract will be surgically removed. If you have cataracts in both eyes, only one is usually removed at a time. This allows the operated eye to heal and be out of danger from any possible problems after surgery (such as infection or poor wound healing). In rare cases, a cataract may be doing damage to your eye. In these cases, your caregiver may advise surgical removal right away. The vast majority of people who have cataract surgery have better vision afterward. HOME CARE INSTRUCTIONS  If you are not planning surgery, you may be asked to do the following:  Use different eyeglasses.   Use stronger or brighter lighting.   Ask your eye doctor  about reducing your medicine dose or changing medicines if it is thought that a medicine caused your cataract. Changing medicines does not make the cataract go away on its own.   Become familiar with your surroundings. Poor vision can lead to injury. Avoid bumping into things on the affected side. You are at a higher risk for tripping or falling.   Exercise extreme care when driving or operating machinery.   Wear sunglasses if you are sensitive to bright light or experiencing problems with glare.  SEEK IMMEDIATE  MEDICAL CARE IF:   You have a worsening or sudden vision loss.   You notice redness, swelling, or increasing pain in the eye.   You have a fever.  Document Released: 03/14/2005 Document Revised: 03/03/2011 Document Reviewed: 11/05/2010 [facility] Patient Information 2012 [place].PATIENT INSTRUCTIONS POST-ANESTHESIA  IMMEDIATELY FOLLOWING SURGERY:  Do not drive or operate machinery for the first twenty four hours after surgery.  Do not make any important decisions for twenty four hours after surgery or while taking narcotic pain medications or sedatives.  If you develop intractable nausea and vomiting or a severe headache please notify your doctor immediately.  FOLLOW-UP:  Please make an appointment with your surgeon as instructed. You do not need to follow up with anesthesia unless specifically instructed to do so.  WOUND CARE INSTRUCTIONS (if applicable):  Keep a dry clean dressing on the anesthesia/puncture wound site if there is drainage.  Once the wound has quit draining you may leave it open to air.  Generally you should leave the bandage intact for twenty four hours unless there is drainage.  If the epidural site drains for more than 36-48 hours please call the anesthesia department.  QUESTIONS?:  Please feel free to call your physician or the hospital operator if you have any questions, and they will be happy to assist you.

## 2014-08-13 NOTE — Pre-Procedure Instructions (Signed)
Patient given information to sign up for my chart at home. 

## 2014-08-20 MED ORDER — PHENYLEPHRINE HCL 2.5 % OP SOLN
OPHTHALMIC | Status: AC
  Filled 2014-08-20: qty 15

## 2014-08-20 MED ORDER — TETRACAINE HCL 0.5 % OP SOLN
OPHTHALMIC | Status: AC
  Filled 2014-08-20: qty 2

## 2014-08-20 MED ORDER — NEOMYCIN-POLYMYXIN-DEXAMETH 3.5-10000-0.1 OP SUSP
OPHTHALMIC | Status: AC
  Filled 2014-08-20: qty 5

## 2014-08-20 MED ORDER — LIDOCAINE HCL (PF) 1 % IJ SOLN
INTRAMUSCULAR | Status: AC
  Filled 2014-08-20: qty 2

## 2014-08-20 MED ORDER — CYCLOPENTOLATE-PHENYLEPHRINE OP SOLN OPTIME - NO CHARGE
OPHTHALMIC | Status: AC
  Filled 2014-08-20: qty 2

## 2014-08-20 MED ORDER — LIDOCAINE HCL 3.5 % OP GEL
OPHTHALMIC | Status: AC
  Filled 2014-08-20: qty 1

## 2014-08-21 ENCOUNTER — Ambulatory Visit (HOSPITAL_COMMUNITY): Payer: Medicare Other | Admitting: Anesthesiology

## 2014-08-21 ENCOUNTER — Encounter (HOSPITAL_COMMUNITY)
Admission: RE | Disposition: A | Discharge status: Home / Self Care | Payer: Self-pay | Source: Ambulatory Visit | Attending: Ophthalmology

## 2014-08-21 ENCOUNTER — Encounter (HOSPITAL_COMMUNITY): Payer: Self-pay | Admitting: Emergency Medicine

## 2014-08-21 ENCOUNTER — Ambulatory Visit (HOSPITAL_COMMUNITY)
Admission: RE | Admit: 2014-08-21 | Discharge: 2014-08-21 | Disposition: A | Discharge status: Home / Self Care | Payer: Medicare Other | Source: Ambulatory Visit | Attending: Ophthalmology | Admitting: Ophthalmology

## 2014-08-21 DIAGNOSIS — H259 Unspecified age-related cataract: Secondary | ICD-10-CM | POA: Diagnosis not present

## 2014-08-21 DIAGNOSIS — H25811 Combined forms of age-related cataract, right eye: Secondary | ICD-10-CM | POA: Insufficient documentation

## 2014-08-21 DIAGNOSIS — Z79899 Other long term (current) drug therapy: Secondary | ICD-10-CM | POA: Insufficient documentation

## 2014-08-21 DIAGNOSIS — K219 Gastro-esophageal reflux disease without esophagitis: Secondary | ICD-10-CM | POA: Diagnosis not present

## 2014-08-21 DIAGNOSIS — Z87891 Personal history of nicotine dependence: Secondary | ICD-10-CM | POA: Diagnosis not present

## 2014-08-21 DIAGNOSIS — I10 Essential (primary) hypertension: Secondary | ICD-10-CM | POA: Diagnosis not present

## 2014-08-21 DIAGNOSIS — Z950 Presence of cardiac pacemaker: Secondary | ICD-10-CM | POA: Insufficient documentation

## 2014-08-21 DIAGNOSIS — M1990 Unspecified osteoarthritis, unspecified site: Secondary | ICD-10-CM | POA: Insufficient documentation

## 2014-08-21 DIAGNOSIS — I2510 Atherosclerotic heart disease of native coronary artery without angina pectoris: Secondary | ICD-10-CM | POA: Diagnosis not present

## 2014-08-21 DIAGNOSIS — I4891 Unspecified atrial fibrillation: Secondary | ICD-10-CM | POA: Insufficient documentation

## 2014-08-21 DIAGNOSIS — H269 Unspecified cataract: Secondary | ICD-10-CM | POA: Diagnosis present

## 2014-08-21 DIAGNOSIS — E039 Hypothyroidism, unspecified: Secondary | ICD-10-CM | POA: Diagnosis not present

## 2014-08-21 DIAGNOSIS — I509 Heart failure, unspecified: Secondary | ICD-10-CM | POA: Insufficient documentation

## 2014-08-21 DIAGNOSIS — Z951 Presence of aortocoronary bypass graft: Secondary | ICD-10-CM | POA: Diagnosis not present

## 2014-08-21 HISTORY — PX: CATARACT EXTRACTION W/PHACO: SHX586

## 2014-08-21 SURGERY — PHACOEMULSIFICATION, CATARACT, WITH IOL INSERTION
Anesthesia: Monitor Anesthesia Care | Site: Eye | Laterality: Right

## 2014-08-21 MED ORDER — LACTATED RINGERS IV SOLN
INTRAVENOUS | Status: DC
  Administered 2014-08-21: 12:00:00 via INTRAVENOUS

## 2014-08-21 MED ORDER — LIDOCAINE HCL 3.5 % OP GEL
1.0000 "application " | Freq: Once | OPHTHALMIC | Status: AC
  Administered 2014-08-21: 1 via OPHTHALMIC

## 2014-08-21 MED ORDER — PROVISC 10 MG/ML IO SOLN
INTRAOCULAR | Status: DC | PRN
  Administered 2014-08-21: 0.85 mL via INTRAOCULAR

## 2014-08-21 MED ORDER — TETRACAINE HCL 0.5 % OP SOLN
1.0000 [drp] | OPHTHALMIC | Status: AC
  Administered 2014-08-21 (×3): 1 [drp] via OPHTHALMIC

## 2014-08-21 MED ORDER — CYCLOPENTOLATE-PHENYLEPHRINE 0.2-1 % OP SOLN
1.0000 [drp] | OPHTHALMIC | Status: AC
  Administered 2014-08-21 (×3): 1 [drp] via OPHTHALMIC

## 2014-08-21 MED ORDER — PHENYLEPHRINE HCL 2.5 % OP SOLN
1.0000 [drp] | OPHTHALMIC | Status: AC
Start: 2014-08-21 — End: 2014-08-21
  Administered 2014-08-21 (×3): 1 [drp] via OPHTHALMIC

## 2014-08-21 MED ORDER — NEOMYCIN-POLYMYXIN-DEXAMETH 3.5-10000-0.1 OP SUSP
OPHTHALMIC | Status: DC | PRN
  Administered 2014-08-21: 2 [drp] via OPHTHALMIC

## 2014-08-21 MED ORDER — EPINEPHRINE HCL 1 MG/ML IJ SOLN
INTRAMUSCULAR | Status: AC
  Filled 2014-08-21: qty 1

## 2014-08-21 MED ORDER — EPINEPHRINE HCL 1 MG/ML IJ SOLN
INTRAOCULAR | Status: DC | PRN
  Administered 2014-08-21: 500 mL

## 2014-08-21 MED ORDER — BSS IO SOLN
INTRAOCULAR | Status: DC | PRN
  Administered 2014-08-21: 15 mL

## 2014-08-21 MED ORDER — POVIDONE-IODINE 5 % OP SOLN
OPHTHALMIC | Status: DC | PRN
  Administered 2014-08-21: 1 via OPHTHALMIC

## 2014-08-21 MED ORDER — LIDOCAINE HCL (PF) 1 % IJ SOLN
INTRAOCULAR | Status: DC | PRN
  Administered 2014-08-21: .7 mL via OPHTHALMIC

## 2014-08-21 MED ORDER — MIDAZOLAM HCL 2 MG/2ML IJ SOLN
INTRAMUSCULAR | Status: AC
  Filled 2014-08-21: qty 2

## 2014-08-21 MED ORDER — MIDAZOLAM HCL 2 MG/2ML IJ SOLN
1.0000 mg | INTRAMUSCULAR | Status: DC | PRN
Start: 2014-08-21 — End: 2014-08-21
  Administered 2014-08-21: 2 mg via INTRAVENOUS

## 2014-08-21 MED ORDER — FENTANYL CITRATE (PF) 100 MCG/2ML IJ SOLN
INTRAMUSCULAR | Status: AC
  Filled 2014-08-21: qty 2

## 2014-08-21 SURGICAL SUPPLY — 33 items
CAPSULAR TENSION RING-AMO (OPHTHALMIC RELATED) IMPLANT
CLOTH BEACON ORANGE TIMEOUT ST (SAFETY) ×2 IMPLANT
EYE SHIELD UNIVERSAL CLEAR (GAUZE/BANDAGES/DRESSINGS) ×2 IMPLANT
GLOVE BIO SURGEON STRL SZ 6.5 (GLOVE) IMPLANT
GLOVE BIOGEL PI IND STRL 6.5 (GLOVE) IMPLANT
GLOVE BIOGEL PI IND STRL 7.0 (GLOVE) ×2 IMPLANT
GLOVE BIOGEL PI IND STRL 7.5 (GLOVE) IMPLANT
GLOVE BIOGEL PI INDICATOR 6.5 (GLOVE)
GLOVE BIOGEL PI INDICATOR 7.0 (GLOVE) ×2
GLOVE BIOGEL PI INDICATOR 7.5 (GLOVE)
GLOVE ECLIPSE 6.5 STRL STRAW (GLOVE) IMPLANT
GLOVE ECLIPSE 7.0 STRL STRAW (GLOVE) IMPLANT
GLOVE ECLIPSE 7.5 STRL STRAW (GLOVE) IMPLANT
GLOVE EXAM NITRILE LRG STRL (GLOVE) IMPLANT
GLOVE EXAM NITRILE MD LF STRL (GLOVE) IMPLANT
GLOVE SKINSENSE NS SZ6.5 (GLOVE)
GLOVE SKINSENSE NS SZ7.0 (GLOVE)
GLOVE SKINSENSE STRL SZ6.5 (GLOVE) IMPLANT
GLOVE SKINSENSE STRL SZ7.0 (GLOVE) IMPLANT
KIT VITRECTOMY (OPHTHALMIC RELATED) IMPLANT
PAD ARMBOARD 7.5X6 YLW CONV (MISCELLANEOUS) ×2 IMPLANT
PROC W NO LENS (INTRAOCULAR LENS)
PROC W SPEC LENS (INTRAOCULAR LENS)
PROCESS W NO LENS (INTRAOCULAR LENS) IMPLANT
PROCESS W SPEC LENS (INTRAOCULAR LENS) IMPLANT
RETRACTOR IRIS SIGHTPATH (OPHTHALMIC RELATED) IMPLANT
RING MALYGIN (MISCELLANEOUS) IMPLANT
SIGHTPATH CAT PROC W REG LENS (Ophthalmic Related) ×2 IMPLANT
SYRINGE LUER LOK 1CC (MISCELLANEOUS) ×2 IMPLANT
TAPE SURG TRANSPORE 1 IN (GAUZE/BANDAGES/DRESSINGS) ×1 IMPLANT
TAPE SURGICAL TRANSPORE 1 IN (GAUZE/BANDAGES/DRESSINGS) ×1
VISCOELASTIC ADDITIONAL (OPHTHALMIC RELATED) IMPLANT
WATER STERILE IRR 250ML POUR (IV SOLUTION) ×2 IMPLANT

## 2014-08-21 NOTE — Anesthesia Preprocedure Evaluation (Signed)
Anesthesia Evaluation  Patient identified by MRN, date of birth, ID band Patient awake    Reviewed: Allergy & Precautions, NPO status , Patient's Chart, lab work & pertinent test results, reviewed documented beta blocker date and time   Airway Mallampati: III  TM Distance: >3 FB   Mouth opening: Limited Mouth Opening  Dental  (+) Teeth Intact   Pulmonary shortness of breath, former smoker,  breath sounds clear to auscultation        Cardiovascular hypertension, Pt. on medications and Pt. on home beta blockers + CAD, + CABG and +CHF + dysrhythmias Atrial Fibrillation + pacemaker Rhythm:Regular Rate:Normal     Neuro/Psych    GI/Hepatic GERD-  Medicated,  Endo/Other  Hypothyroidism   Renal/GU      Musculoskeletal   Abdominal   Peds  Hematology   Anesthesia Other Findings   Reproductive/Obstetrics                             Anesthesia Physical Anesthesia Plan  ASA: III  Anesthesia Plan: MAC   Post-op Pain Management:    Induction: Intravenous  Airway Management Planned: Nasal Cannula  Additional Equipment:   Intra-op Plan:   Post-operative Plan:   Informed Consent: I have reviewed the patients History and Physical, chart, labs and discussed the procedure including the risks, benefits and alternatives for the proposed anesthesia with the patient or authorized representative who has indicated his/her understanding and acceptance.     Plan Discussed with:   Anesthesia Plan Comments:         Anesthesia Quick Evaluation

## 2014-08-21 NOTE — Anesthesia Postprocedure Evaluation (Signed)
  Anesthesia Post-op Note  Patient: [name]  Procedure(s) Performed: Procedure(s) with comments: CATARACT EXTRACTION PHACO AND INTRAOCULAR LENS PLACEMENT (IOC) (Right) - CDE 10.94  Patient Location: Short Stay  Anesthesia Type:MAC  Level of Consciousness: awake, alert , oriented and patient cooperative  Airway and Oxygen Therapy: Patient Spontanous Breathing  Post-op Pain: none  Post-op Assessment: Post-op Vital signs reviewed, Patient's Cardiovascular Status Stable, Respiratory Function Stable, Patent Airway, No signs of Nausea or vomiting and Pain level controlled  Post-op Vital Signs: Reviewed and stable  Last Vitals:  Filed Vitals:   08/21/14 1221  BP: 153/63  Pulse:   Temp:   Resp: 74    Complications: No apparent anesthesia complications

## 2014-08-21 NOTE — Op Note (Signed)
Date of Admission: 08/21/2014  Date of Surgery: 08/21/2014   Pre-Op Dx: Cataract Right Eye  Post-Op Dx: Senile Combined Cataract Right  Eye,  Dx Code O17.711  Surgeon: Tonny Branch, M.D.  Assistants: None  Anesthesia: Topical with MAC  Indications: Painless, progressive loss of vision with compromise of daily activities.  Surgery: Cataract Extraction with Intraocular lens Implant Right Eye  Discription: The patient had dilating drops and viscous lidocaine placed into the Right eye in the pre-op holding area. After transfer to the operating room, a time out was performed. The patient was then prepped and draped. Beginning with a 98 degree blade a paracentesis port was made at the surgeon's 2 o'clock position. The anterior chamber was then filled with 1% non-preserved lidocaine. This was followed by filling the anterior chamber with Provisc.  A 2.74mm keratome blade was used to make a clear corneal incision at the temporal limbus.  A bent cystatome needle was used to create a continuous tear capsulotomy. Hydrodissection was performed with balanced salt solution on a Fine canula. The lens nucleus was then removed using the phacoemulsification handpiece. Residual cortex was removed with the I&A handpiece. The anterior chamber and capsular bag were refilled with Provisc. A posterior chamber intraocular lens was placed into the capsular bag with it's injector. The implant was positioned with the Kuglan hook. The Provisc was then removed from the anterior chamber and capsular bag with the I&A handpiece. Stromal hydration of the main incision and paracentesis port was performed with BSS on a Fine canula. The wounds were tested for leak which was negative. The patient tolerated the procedure well. There were no operative complications. The patient was then transferred to the recovery room in stable condition.  Complications: None  Specimen: None  EBL: None  Prosthetic device: Hoya iSert 250, power 15.5  D, SN [id number].

## 2014-08-21 NOTE — Anesthesia Procedure Notes (Signed)
Procedure Name: MAC Date/Time: 08/21/2014 12:28 PM Performed by: Andree Elk, Radin Raptis A Pre-anesthesia Checklist: Patient identified, Timeout performed, Emergency Drugs available, Suction available and Patient being monitored Oxygen Delivery Method: Nasal cannula

## 2014-08-21 NOTE — Discharge Instructions (Signed)

## 2014-08-21 NOTE — H&P (Signed)
I have reviewed the H&P, the patient was re-examined, and I have identified no interval changes in medical condition and plan of care since the history and physical of record  

## 2014-08-21 NOTE — Transfer of Care (Signed)
Immediate Anesthesia Transfer of Care Note  Patient: [name]  Procedure(s) Performed: Procedure(s) with comments: CATARACT EXTRACTION PHACO AND INTRAOCULAR LENS PLACEMENT (IOC) (Right) - CDE 10.94  Patient Location: Short Stay  Anesthesia Type:MAC  Level of Consciousness: awake, alert , oriented and patient cooperative  Airway & Oxygen Therapy: Patient Spontanous Breathing  Post-op Assessment: Report given to RN and Post -op Vital signs reviewed and stable  Post vital signs: Reviewed and stable  Last Vitals:  Filed Vitals:   08/21/14 1221  BP: 153/63  Pulse:   Temp:   Resp: 74    Complications: No apparent anesthesia complications

## 2014-08-22 ENCOUNTER — Encounter (HOSPITAL_COMMUNITY): Payer: Self-pay | Admitting: Ophthalmology

## 2014-09-01 DIAGNOSIS — H25812 Combined forms of age-related cataract, left eye: Secondary | ICD-10-CM | POA: Diagnosis not present

## 2014-09-01 DIAGNOSIS — Z961 Presence of intraocular lens: Secondary | ICD-10-CM | POA: Diagnosis not present

## 2014-09-04 ENCOUNTER — Encounter (HOSPITAL_COMMUNITY)
Admission: RE | Admit: 2014-09-04 | Discharge: 2014-09-04 | Disposition: A | Discharge status: Home / Self Care | Payer: Medicare Other | Source: Ambulatory Visit | Attending: Ophthalmology | Admitting: Ophthalmology

## 2014-09-05 MED ORDER — LIDOCAINE HCL (PF) 1 % IJ SOLN
INTRAMUSCULAR | Status: AC
  Filled 2014-09-05: qty 2

## 2014-09-05 MED ORDER — PHENYLEPHRINE HCL 2.5 % OP SOLN
OPHTHALMIC | Status: AC
  Filled 2014-09-05: qty 15

## 2014-09-05 MED ORDER — LIDOCAINE HCL 3.5 % OP GEL
OPHTHALMIC | Status: AC
  Filled 2014-09-05: qty 1

## 2014-09-05 MED ORDER — TETRACAINE HCL 0.5 % OP SOLN
OPHTHALMIC | Status: AC
  Filled 2014-09-05: qty 2

## 2014-09-05 MED ORDER — CYCLOPENTOLATE-PHENYLEPHRINE OP SOLN OPTIME - NO CHARGE
OPHTHALMIC | Status: AC
  Filled 2014-09-05: qty 2

## 2014-09-05 MED ORDER — NEOMYCIN-POLYMYXIN-DEXAMETH 3.5-10000-0.1 OP SUSP
OPHTHALMIC | Status: AC
  Filled 2014-09-05: qty 5

## 2014-09-08 ENCOUNTER — Encounter (HOSPITAL_COMMUNITY): Payer: Self-pay | Admitting: *Deleted

## 2014-09-08 ENCOUNTER — Ambulatory Visit (HOSPITAL_COMMUNITY): Payer: Medicare Other | Admitting: Anesthesiology

## 2014-09-08 ENCOUNTER — Encounter (HOSPITAL_COMMUNITY)
Admission: RE | Disposition: A | Discharge status: Home / Self Care | Payer: Self-pay | Source: Ambulatory Visit | Attending: Ophthalmology

## 2014-09-08 ENCOUNTER — Ambulatory Visit (HOSPITAL_COMMUNITY)
Admission: RE | Admit: 2014-09-08 | Discharge: 2014-09-08 | Disposition: A | Discharge status: Home / Self Care | Payer: Medicare Other | Source: Ambulatory Visit | Attending: Ophthalmology | Admitting: Ophthalmology

## 2014-09-08 DIAGNOSIS — H25812 Combined forms of age-related cataract, left eye: Secondary | ICD-10-CM | POA: Diagnosis not present

## 2014-09-08 DIAGNOSIS — I10 Essential (primary) hypertension: Secondary | ICD-10-CM | POA: Diagnosis not present

## 2014-09-08 DIAGNOSIS — Z87891 Personal history of nicotine dependence: Secondary | ICD-10-CM | POA: Insufficient documentation

## 2014-09-08 DIAGNOSIS — I2510 Atherosclerotic heart disease of native coronary artery without angina pectoris: Secondary | ICD-10-CM | POA: Insufficient documentation

## 2014-09-08 DIAGNOSIS — I4891 Unspecified atrial fibrillation: Secondary | ICD-10-CM | POA: Diagnosis not present

## 2014-09-08 DIAGNOSIS — E039 Hypothyroidism, unspecified: Secondary | ICD-10-CM | POA: Insufficient documentation

## 2014-09-08 DIAGNOSIS — Z79899 Other long term (current) drug therapy: Secondary | ICD-10-CM | POA: Diagnosis not present

## 2014-09-08 DIAGNOSIS — I509 Heart failure, unspecified: Secondary | ICD-10-CM | POA: Diagnosis not present

## 2014-09-08 DIAGNOSIS — K219 Gastro-esophageal reflux disease without esophagitis: Secondary | ICD-10-CM | POA: Insufficient documentation

## 2014-09-08 DIAGNOSIS — Z951 Presence of aortocoronary bypass graft: Secondary | ICD-10-CM | POA: Insufficient documentation

## 2014-09-08 DIAGNOSIS — H259 Unspecified age-related cataract: Secondary | ICD-10-CM | POA: Diagnosis not present

## 2014-09-08 DIAGNOSIS — H269 Unspecified cataract: Secondary | ICD-10-CM | POA: Diagnosis present

## 2014-09-08 HISTORY — PX: CATARACT EXTRACTION W/PHACO: SHX586

## 2014-09-08 SURGERY — PHACOEMULSIFICATION, CATARACT, WITH IOL INSERTION
Anesthesia: Monitor Anesthesia Care | Site: Eye | Laterality: Left

## 2014-09-08 MED ORDER — FENTANYL CITRATE (PF) 100 MCG/2ML IJ SOLN
INTRAMUSCULAR | Status: AC
  Filled 2014-09-08: qty 2

## 2014-09-08 MED ORDER — LIDOCAINE HCL 3.5 % OP GEL
1.0000 "application " | Freq: Once | OPHTHALMIC | Status: AC
  Administered 2014-09-08: 1 via OPHTHALMIC

## 2014-09-08 MED ORDER — CYCLOPENTOLATE-PHENYLEPHRINE 0.2-1 % OP SOLN
1.0000 [drp] | OPHTHALMIC | Status: AC
  Administered 2014-09-08 (×3): 1 [drp] via OPHTHALMIC

## 2014-09-08 MED ORDER — EPINEPHRINE HCL 1 MG/ML IJ SOLN
INTRAMUSCULAR | Status: AC
  Filled 2014-09-08: qty 1

## 2014-09-08 MED ORDER — NEOMYCIN-POLYMYXIN-DEXAMETH 3.5-10000-0.1 OP SUSP
OPHTHALMIC | Status: DC | PRN
  Administered 2014-09-08: 2 [drp] via OPHTHALMIC

## 2014-09-08 MED ORDER — FENTANYL CITRATE (PF) 100 MCG/2ML IJ SOLN
25.0000 ug | Freq: Once | INTRAMUSCULAR | Status: AC
  Administered 2014-09-08: 25 ug via INTRAVENOUS

## 2014-09-08 MED ORDER — EPINEPHRINE HCL 1 MG/ML IJ SOLN
INTRAOCULAR | Status: DC | PRN
  Administered 2014-09-08: 500 mL

## 2014-09-08 MED ORDER — LACTATED RINGERS IV SOLN
INTRAVENOUS | Status: DC
  Administered 2014-09-08: 1000 mL via INTRAVENOUS

## 2014-09-08 MED ORDER — POVIDONE-IODINE 5 % OP SOLN
OPHTHALMIC | Status: DC | PRN
  Administered 2014-09-08: 1 via OPHTHALMIC

## 2014-09-08 MED ORDER — PHENYLEPHRINE HCL 2.5 % OP SOLN
1.0000 [drp] | OPHTHALMIC | Status: AC
  Administered 2014-09-08 (×3): 1 [drp] via OPHTHALMIC

## 2014-09-08 MED ORDER — TETRACAINE HCL 0.5 % OP SOLN
1.0000 [drp] | OPHTHALMIC | Status: AC | PRN
  Administered 2014-09-08 (×3): 1 [drp] via OPHTHALMIC

## 2014-09-08 MED ORDER — BSS IO SOLN
INTRAOCULAR | Status: DC | PRN
  Administered 2014-09-08: 15 mL

## 2014-09-08 MED ORDER — PROVISC 10 MG/ML IO SOLN
INTRAOCULAR | Status: DC | PRN
Start: 2014-09-08 — End: 2014-09-08
  Administered 2014-09-08: 0.85 mL via INTRAOCULAR

## 2014-09-08 MED ORDER — EPINEPHRINE HCL 1 MG/ML IJ SOLN
INTRAMUSCULAR | Status: DC | PRN
  Administered 2014-09-08: .9 mL via OPHTHALMIC

## 2014-09-08 MED ORDER — MIDAZOLAM HCL 2 MG/2ML IJ SOLN
INTRAMUSCULAR | Status: AC
  Filled 2014-09-08: qty 2

## 2014-09-08 MED ORDER — PHENYLEPHRINE-KETOROLAC 1-0.3 % IO SOLN
INTRAOCULAR | Status: AC
  Filled 2014-09-08: qty 4

## 2014-09-08 MED ORDER — MIDAZOLAM HCL 2 MG/2ML IJ SOLN
1.0000 mg | INTRAMUSCULAR | Status: DC | PRN
  Administered 2014-09-08: 2 mg via INTRAVENOUS

## 2014-09-08 SURGICAL SUPPLY — 10 items
CLOTH BEACON ORANGE TIMEOUT ST (SAFETY) ×2 IMPLANT
EYE SHIELD UNIVERSAL CLEAR (GAUZE/BANDAGES/DRESSINGS) ×2 IMPLANT
GLOVE BIOGEL PI IND STRL 7.0 (GLOVE) ×2 IMPLANT
GLOVE BIOGEL PI INDICATOR 7.0 (GLOVE) ×2
PAD ARMBOARD 7.5X6 YLW CONV (MISCELLANEOUS) ×2 IMPLANT
SIGHTPATH CAT PROC W REG LENS (Ophthalmic Related) ×2 IMPLANT
SYRINGE LUER LOK 1CC (MISCELLANEOUS) ×2 IMPLANT
TAPE SURG TRANSPORE 1 IN (GAUZE/BANDAGES/DRESSINGS) ×1 IMPLANT
TAPE SURGICAL TRANSPORE 1 IN (GAUZE/BANDAGES/DRESSINGS) ×1
WATER STERILE IRR 250ML POUR (IV SOLUTION) ×2 IMPLANT

## 2014-09-08 NOTE — H&P (Signed)
I have reviewed the H&P, the patient was re-examined, and I have identified no interval changes in medical condition and plan of care since the history and physical of record  

## 2014-09-08 NOTE — Anesthesia Postprocedure Evaluation (Signed)
  Anesthesia Post-op Note  Patient: [name]  Procedure(s) Performed: Procedure(s) with comments: CATARACT EXTRACTION PHACO AND INTRAOCULAR LENS PLACEMENT (IOC) (Left) - CDE:7.79  Patient Location: Short Stay  Anesthesia Type:MAC  Level of Consciousness: awake, alert  and oriented  Airway and Oxygen Therapy: Patient Spontanous Breathing  Post-op Pain: none  Post-op Assessment: Post-op Vital signs reviewed, Patient's Cardiovascular Status Stable, Respiratory Function Stable, Patent Airway and No signs of Nausea or vomiting              Post-op Vital Signs: Reviewed and stable  Last Vitals:  Filed Vitals:   09/08/14 1055  BP: 141/64  Pulse:   Temp:   Resp: 13    Complications: No apparent anesthesia complications

## 2014-09-08 NOTE — Discharge Instructions (Signed)

## 2014-09-08 NOTE — Anesthesia Preprocedure Evaluation (Signed)
Anesthesia Evaluation  Patient identified by MRN, date of birth, ID band Patient awake    Reviewed: Allergy & Precautions, NPO status , Patient's Chart, lab work & pertinent test results, reviewed documented beta blocker date and time   Airway Mallampati: III  TM Distance: >3 FB   Mouth opening: Limited Mouth Opening  Dental  (+) Teeth Intact   Pulmonary shortness of breath, former smoker,  breath sounds clear to auscultation        Cardiovascular hypertension, Pt. on medications and Pt. on home beta blockers + CAD, + CABG and +CHF + dysrhythmias Atrial Fibrillation + pacemaker Rhythm:Regular Rate:Normal     Neuro/Psych    GI/Hepatic GERD-  Medicated,  Endo/Other  Hypothyroidism   Renal/GU      Musculoskeletal   Abdominal   Peds  Hematology   Anesthesia Other Findings   Reproductive/Obstetrics                             Anesthesia Physical Anesthesia Plan  ASA: III  Anesthesia Plan: MAC   Post-op Pain Management:    Induction: Intravenous  Airway Management Planned: Nasal Cannula  Additional Equipment:   Intra-op Plan:   Post-operative Plan:   Informed Consent: I have reviewed the patients History and Physical, chart, labs and discussed the procedure including the risks, benefits and alternatives for the proposed anesthesia with the patient or authorized representative who has indicated his/her understanding and acceptance.     Plan Discussed with:   Anesthesia Plan Comments:         Anesthesia Quick Evaluation

## 2014-09-08 NOTE — Op Note (Signed)
Date of Admission: 09/08/2014  Date of Surgery: 09/08/2014   Pre-Op Dx: Cataract Left Eye  Post-Op Dx: Senile Combined Cataract Left  Eye,  Dx Code O84.166  Surgeon: Tonny Branch, M.D.  Assistants: None  Anesthesia: Topical with MAC  Indications: Painless, progressive loss of vision with compromise of daily activities.  Surgery: Cataract Extraction with Intraocular lens Implant Left Eye  Discription: The patient had dilating drops and viscous lidocaine placed into the Left eye in the pre-op holding area. After transfer to the operating room, a time out was performed. The patient was then prepped and draped. Beginning with a 53 degree blade a paracentesis port was made at the surgeon's 2 o'clock position. The anterior chamber was then filled with 1% non-preserved lidocaine. This was followed by filling the anterior chamber with Provisc.  A 2.48mm keratome blade was used to make a clear corneal incision at the temporal limbus.  A bent cystatome needle was used to create a continuous tear capsulotomy. Hydrodissection was performed with balanced salt solution on a Fine canula. The lens nucleus was then removed using the phacoemulsification handpiece. Residual cortex was removed with the I&A handpiece. The anterior chamber and capsular bag were refilled with Provisc. A posterior chamber intraocular lens was placed into the capsular bag with it's injector. The implant was positioned with the Kuglan hook. The Provisc was then removed from the anterior chamber and capsular bag with the I&A handpiece. Stromal hydration of the main incision and paracentesis port was performed with BSS on a Fine canula. The wounds were tested for leak which was negative. The patient tolerated the procedure well. There were no operative complications. The patient was then transferred to the recovery room in stable condition.  Complications: None  Specimen: None  EBL: None  Prosthetic device: Hoya iSert 250, power 18.0 D, SN  [id number].

## 2014-09-08 NOTE — Transfer of Care (Signed)
Immediate Anesthesia Transfer of Care Note  Patient: [name]  Procedure(s) Performed: Procedure(s) with comments: CATARACT EXTRACTION PHACO AND INTRAOCULAR LENS PLACEMENT (IOC) (Left) - CDE:7.79  Patient Location: Short Stay  Anesthesia Type:MAC  Level of Consciousness: awake  Airway & Oxygen Therapy: Patient Spontanous Breathing  Post-op Assessment: Report given to RN  Post vital signs: Reviewed  Last Vitals:  Filed Vitals:   09/08/14 1055  BP: 141/64  Pulse:   Temp:   Resp: 13    Complications: No apparent anesthesia complications

## 2014-09-09 ENCOUNTER — Encounter (HOSPITAL_COMMUNITY): Payer: Self-pay | Admitting: Ophthalmology

## 2014-09-19 DIAGNOSIS — J029 Acute pharyngitis, unspecified: Secondary | ICD-10-CM | POA: Diagnosis not present

## 2014-09-19 DIAGNOSIS — J441 Chronic obstructive pulmonary disease with (acute) exacerbation: Secondary | ICD-10-CM | POA: Diagnosis not present

## 2014-09-19 DIAGNOSIS — J069 Acute upper respiratory infection, unspecified: Secondary | ICD-10-CM | POA: Diagnosis not present

## 2014-09-30 DIAGNOSIS — Z6821 Body mass index (BMI) 21.0-21.9, adult: Secondary | ICD-10-CM | POA: Diagnosis not present

## 2014-09-30 DIAGNOSIS — Z1389 Encounter for screening for other disorder: Secondary | ICD-10-CM | POA: Diagnosis not present

## 2014-09-30 DIAGNOSIS — E039 Hypothyroidism, unspecified: Secondary | ICD-10-CM | POA: Diagnosis not present

## 2014-09-30 DIAGNOSIS — E782 Mixed hyperlipidemia: Secondary | ICD-10-CM | POA: Diagnosis not present

## 2014-10-07 ENCOUNTER — Encounter: Payer: Self-pay | Admitting: *Deleted

## 2014-12-10 DIAGNOSIS — I10 Essential (primary) hypertension: Secondary | ICD-10-CM | POA: Diagnosis not present

## 2014-12-10 DIAGNOSIS — D649 Anemia, unspecified: Secondary | ICD-10-CM | POA: Diagnosis not present

## 2014-12-10 DIAGNOSIS — E559 Vitamin D deficiency, unspecified: Secondary | ICD-10-CM | POA: Diagnosis not present

## 2014-12-10 DIAGNOSIS — Z79899 Other long term (current) drug therapy: Secondary | ICD-10-CM | POA: Diagnosis not present

## 2014-12-10 DIAGNOSIS — N183 Chronic kidney disease, stage 3 (moderate): Secondary | ICD-10-CM | POA: Diagnosis not present

## 2014-12-10 DIAGNOSIS — R809 Proteinuria, unspecified: Secondary | ICD-10-CM | POA: Diagnosis not present

## 2014-12-10 DIAGNOSIS — D509 Iron deficiency anemia, unspecified: Secondary | ICD-10-CM | POA: Diagnosis not present

## 2014-12-16 NOTE — Patient Outreach (Signed)
[name]) Care Management  12/16/2014  [name] 09/22/1924 [id number]   Referral from [employer] Tier 2 List, assigned [name], RN to outreach.  Thanks, [name]. [name], [place] Assistant Phone: [phone] Fax: [phone]

## 2014-12-17 DIAGNOSIS — N183 Chronic kidney disease, stage 3 (moderate): Secondary | ICD-10-CM | POA: Diagnosis not present

## 2014-12-17 DIAGNOSIS — E559 Vitamin D deficiency, unspecified: Secondary | ICD-10-CM | POA: Diagnosis not present

## 2014-12-17 DIAGNOSIS — R809 Proteinuria, unspecified: Secondary | ICD-10-CM | POA: Diagnosis not present

## 2014-12-18 ENCOUNTER — Other Ambulatory Visit: Payer: Self-pay

## 2014-12-18 NOTE — Patient Outreach (Signed)
[facility]) Care Management  12/18/2014  [name] 11-Oct-1924 [id number]   First telephone call to patient regarding Tier 2 referral.  No answer voice message left.  HIPAA compliant message left.    Plan: RN Health Coach will contact patient within 1-2 weeks.    Jone Baseman, RN, MSN [place] [phone]

## 2014-12-18 NOTE — Patient Outreach (Signed)
[facility]) Care Management  12/18/2014  [name] [birth year] [id number]   Referral Date: 12-16-14 Referral Source: [employer] Tier 2 List Referral Issue: CHF and COPD- Patient reports she has a pacemaker and some heart issues.  Patient shares that she really does not have problems with her breathing until she has cold and at that point her doctor may order an antibiotic and nebulizer treatments.  Patient states she does not use oxygen or inhalers on a routine daily.  Patient reports the only time she has had problems with fluid was when she was hospitalized with pneumonia.  She reports at that time they drained the fluid off her lungs and she has not had problems since then.  Patient denies any hospital or ED visits in the last 12 months.   PCP: Hilma Favors  Social: Patient reports she lives alone.  Patient states she still drives.   Denies any falls  Assessment: Patient at this time has declined services but in agreement to receive information about [place].   Plan: RN Health Coach will send patient letter and brochure in the mail for further review.   RN Health Coach will forward patient information to Lurline Del for case closure.  Jone Baseman, RN, MSN [place] [phone]

## 2014-12-24 NOTE — Patient Outreach (Signed)
[facility]) Care Management  12/24/2014  [name] 1924/11/05 [id number]   Notification from [name], RN to close case due to patient refused [place] Management services.  Thanks, [name]. [place], [place] Assistant Phone: [phone] Fax: [phone]

## 2014-12-26 ENCOUNTER — Encounter: Payer: Self-pay | Admitting: Internal Medicine

## 2014-12-26 ENCOUNTER — Other Ambulatory Visit: Payer: Self-pay | Admitting: Internal Medicine

## 2014-12-26 ENCOUNTER — Ambulatory Visit (INDEPENDENT_AMBULATORY_CARE_PROVIDER_SITE_OTHER): Payer: Medicare Other | Admitting: Internal Medicine

## 2014-12-26 VITALS — BP 108/58 | HR 89 | Ht 63.0 in | Wt 118.0 lb

## 2014-12-26 DIAGNOSIS — Z79899 Other long term (current) drug therapy: Secondary | ICD-10-CM | POA: Diagnosis not present

## 2014-12-26 DIAGNOSIS — R0602 Shortness of breath: Secondary | ICD-10-CM

## 2014-12-26 DIAGNOSIS — I442 Atrioventricular block, complete: Secondary | ICD-10-CM | POA: Diagnosis not present

## 2014-12-26 DIAGNOSIS — I10 Essential (primary) hypertension: Secondary | ICD-10-CM

## 2014-12-26 MED ORDER — METOPROLOL SUCCINATE ER 25 MG PO TB24
25.0000 mg | ORAL_TABLET | Freq: Every day | ORAL | Status: DC
Start: 2014-12-26 — End: 2016-12-13

## 2014-12-26 NOTE — Progress Notes (Signed)
HPI [name] returns today for ongoing evaluation and management of her DDD PM. She has a h/o symptomatic bradycardia and underwent PPM insertion in 2003 with generator change in 2011. She has been bothered by fatigue and weakness and notes that her thyroid function has been abnormal and is under treatment by Dr. Hilma Favors. She admits to being very sedentary. No chest pain or sob. Minimal peripheral edema. She thinks that she has gotten worse over the past week. Her blood pressure has been on the low side. Allergies  Allergen Reactions  . Macrodantin Shortness Of Breath  . Sulfa Antibiotics Shortness Of Breath     Current Outpatient Prescriptions  Medication Sig Dispense Refill  . aspirin EC 81 MG tablet Take 81 mg by mouth at bedtime.    . Calcium Carbonate-Vitamin D (CALCIUM 600+D) 600-400 MG-UNIT per tablet Take 2 tablets by mouth daily.    . digoxin (LANOXIN) 0.125 MG tablet Take 125 mcg by mouth every morning.     Marland Kitchen esomeprazole (NEXIUM) 40 MG capsule Take 40 mg by mouth daily before breakfast.    . ezetimibe-simvastatin (VYTORIN) 10-20 MG per tablet Take 1 tablet by mouth at bedtime.    . furosemide (LASIX) 20 MG tablet Take 20 mg by mouth 2 (two) times a week.     . levocetirizine (XYZAL) 5 MG tablet Take 5 mg by mouth every morning.     Marland Kitchen levothyroxine (SYNTHROID, LEVOTHROID) 100 MCG tablet Take 1 tablet by mouth daily.    Marland Kitchen lisinopril (PRINIVIL,ZESTRIL) 30 MG tablet Take 15 mg by mouth every evening.     . Magnesium 250 MG TABS Take 1 tablet by mouth 2 (two) times daily.     . metoprolol succinate (TOPROL-XL) 50 MG 24 hr tablet Take 50 mg by mouth daily. Take with or immediately following a meal.    . Omega-3 Fatty Acids (FISH OIL) 1000 MG CAPS Take 1,000 mg by mouth daily.    Vladimir Faster Glycol-Propyl Glycol (SYSTANE OP) Place 1 drop into both eyes at bedtime.    . Potassium 99 MG TABS Take 1 tablet by mouth 2 (two) times daily.     . simvastatin (ZOCOR) 20 MG tablet Take  1 tablet by mouth daily.    . VENTOLIN HFA 108 (90 BASE) MCG/ACT inhaler Inhale 2 puffs into the lungs daily as needed.    . vitamin B-12 (CYANOCOBALAMIN) 1000 MCG tablet Take 1,000 mcg by mouth every morning.     . vitamin C (ASCORBIC ACID) 500 MG tablet Take 500 mg by mouth every morning.      No current facility-administered medications for this visit.     Past Medical History  Diagnosis Date  . Thyroid disease   . Hypertension   . CHF (congestive heart failure)   . Coronary artery disease   . Cardiomyopathy   . Paroxysmal atrial fibrillation   . History of sick sinus syndrome   . Hypothyroidism   . Shortness of breath dyspnea   . GERD (gastroesophageal reflux disease)     ROS:   All systems reviewed and negative except as noted in the HPI.   Past Surgical History  Procedure Laterality Date  . Appendectomy    . Cholecystectomy    . Pacemaker placement    . Knee arthroscopy Right   . Cataract extraction w/phaco Right 08/21/2014    Procedure: CATARACT EXTRACTION PHACO AND INTRAOCULAR LENS PLACEMENT (IOC);  Surgeon: Tonny Branch, MD;  Location: [facility];  Service: Ophthalmology;  Laterality: Right;  CDE 10.94  . Cataract extraction w/phaco Left 09/08/2014    Procedure: CATARACT EXTRACTION PHACO AND INTRAOCULAR LENS PLACEMENT (IOC);  Surgeon: Tonny Branch, MD;  Location: [facility];  Service: Ophthalmology;  Laterality: Left;  CDE:7.79  . Cardiac catheterization  07/25/2005  . Nm myoview ltd  11/06/2008     Family History  Problem Relation Age of Onset  . Heart disease    . Cancer    . Kidney disease       Social History   Social History  . Marital Status: Widowed    Spouse Name: N/A  . Number of Children: N/A  . Years of Education: N/A   Occupational History  . Not on file.   Social History Main Topics  . Smoking status: Former Smoker -- 0.25 packs/day for 15 years    Types: Cigarettes    Quit date: 08/13/1970  . Smokeless tobacco: Never Used  . Alcohol Use: No    . Drug Use: No  . Sexual Activity: No   Other Topics Concern  . Not on file   Social History Narrative     BP 108/58 mmHg  Pulse 89  Ht 5\' 3"  (1.6 m)  Wt 118 lb (53.524 kg)  BMI 20.91 kg/m2  SpO2 93%  Physical Exam:  Well appearing elderly woman, NAD HEENT: Unremarkable Neck:  6 cm JVD, no thyromegally Back:  No CVA tenderness Lungs:  Clear with no wheezes. Well healed PPM incision. HEART:  Regular rate rhythm, no murmurs, no rubs, no clicks Abd:  soft, positive bowel sounds, no organomegally, no rebound, no guarding Ext:  2 plus pulses, trace peripheral edema, no cyanosis, no clubbing Skin:  No rashes no nodules Neuro:  CN II through XII intact, motor grossly intact  DEVICE  Normal device function.  See PaceArt for details.   Assess/Plan:

## 2014-12-26 NOTE — Assessment & Plan Note (Signed)
Her medtronic DDD PM is working normally. Will recheck in several months. 

## 2014-12-26 NOTE — Assessment & Plan Note (Signed)
Her blood pressure is on the low side. Will reduce her dose of metoprolol.

## 2014-12-26 NOTE — Patient Instructions (Signed)
Your physician wants you to follow-up in: 1 Year with Dr. Lovena Le. You will receive a reminder letter in the mail two months in advance. If you don't receive a letter, please call our office to schedule the follow-up appointment.  Remote monitoring is used to monitor your Pacemaker of ICD from home. This monitoring reduces the number of office visits required to check your device to one time per year. It allows [place] to keep an eye on the functioning of your device to ensure it is working properly. You are scheduled for a device check from home on 03/31/15. You may send your transmission at any time that day. If you have a wireless device, the transmission will be sent automatically. After your physician reviews your transmission, you will receive a postcard with your next transmission date.  Your physician has requested that you have an echocardiogram. Echocardiography is a painless test that uses sound waves to create images of your heart. It provides your doctor with information about the size and shape of your heart and how well your heart's chambers and valves are working. This procedure takes approximately one hour. There are no restrictions for this procedure.  Your physician has recommended you make the following change in your medication:  Decrease Toprol- XL to 25 mg Daily  Your physician recommends that you return for lab work in: Today  Thank you for choosing [facility]!

## 2014-12-26 NOTE — Assessment & Plan Note (Signed)
This has worsened in the past few weeks. Will ask her to check a 2D echo to evaluation her LV function. Will also ask her to check a digoxin level.

## 2014-12-27 LAB — DIGOXIN LEVEL: DIGOXIN LVL: 1.1 ng/mL (ref 0.9–2.0)

## 2014-12-30 ENCOUNTER — Ambulatory Visit (HOSPITAL_COMMUNITY)
Admission: RE | Admit: 2014-12-30 | Discharge: 2014-12-30 | Disposition: A | Discharge status: Home / Self Care | Payer: Medicare Other | Source: Ambulatory Visit | Attending: Internal Medicine | Admitting: Internal Medicine

## 2014-12-30 DIAGNOSIS — R079 Chest pain, unspecified: Secondary | ICD-10-CM | POA: Diagnosis not present

## 2014-12-30 DIAGNOSIS — I10 Essential (primary) hypertension: Secondary | ICD-10-CM | POA: Insufficient documentation

## 2014-12-30 DIAGNOSIS — R0602 Shortness of breath: Secondary | ICD-10-CM | POA: Diagnosis not present

## 2014-12-30 DIAGNOSIS — Z87891 Personal history of nicotine dependence: Secondary | ICD-10-CM | POA: Insufficient documentation

## 2015-01-01 ENCOUNTER — Telehealth: Payer: Self-pay | Admitting: Internal Medicine

## 2015-01-05 LAB — CUP PACEART INCLINIC DEVICE CHECK
Battery Voltage: 2.79 V
Brady Statistic AP VS Percent: 0 %
Brady Statistic AS VP Percent: 14 %
Brady Statistic AS VS Percent: 0 %
Lead Channel Impedance Value: 826 Ohm
Lead Channel Pacing Threshold Amplitude: 0.5 V
Lead Channel Pacing Threshold Amplitude: 0.75 V
Lead Channel Pacing Threshold Pulse Width: 0.4 ms
Lead Channel Pacing Threshold Pulse Width: 0.4 ms
Lead Channel Setting Pacing Amplitude: 2 V
Lead Channel Setting Pacing Pulse Width: 0.4 ms
Lead Channel Setting Sensing Sensitivity: 2.8 mV
MDC IDC MSMT BATTERY IMPEDANCE: 449 Ohm
MDC IDC MSMT BATTERY REMAINING LONGEVITY: 95 mo
MDC IDC MSMT LEADCHNL RA IMPEDANCE VALUE: 572 Ohm
MDC IDC MSMT LEADCHNL RA SENSING INTR AMPL: 2 mV
MDC IDC SESS DTM: 20160930122908
MDC IDC SET LEADCHNL RA PACING AMPLITUDE: 1.5 V
MDC IDC STAT BRADY AP VP PERCENT: 86 %

## 2015-02-10 DIAGNOSIS — Z23 Encounter for immunization: Secondary | ICD-10-CM | POA: Diagnosis not present

## 2015-02-13 DIAGNOSIS — G5601 Carpal tunnel syndrome, right upper limb: Secondary | ICD-10-CM | POA: Diagnosis not present

## 2015-02-13 DIAGNOSIS — I10 Essential (primary) hypertension: Secondary | ICD-10-CM | POA: Diagnosis not present

## 2015-02-13 DIAGNOSIS — Z6820 Body mass index (BMI) 20.0-20.9, adult: Secondary | ICD-10-CM | POA: Diagnosis not present

## 2015-02-13 DIAGNOSIS — Z1389 Encounter for screening for other disorder: Secondary | ICD-10-CM | POA: Diagnosis not present

## 2015-02-13 DIAGNOSIS — K219 Gastro-esophageal reflux disease without esophagitis: Secondary | ICD-10-CM | POA: Diagnosis not present

## 2015-02-13 DIAGNOSIS — J449 Chronic obstructive pulmonary disease, unspecified: Secondary | ICD-10-CM | POA: Diagnosis not present

## 2015-03-31 ENCOUNTER — Telehealth: Payer: Self-pay | Admitting: Cardiology

## 2015-03-31 ENCOUNTER — Encounter: Payer: Medicare Other | Admitting: *Deleted

## 2015-03-31 NOTE — Telephone Encounter (Signed)
Spoke with pt and reminded pt of remote transmission that is due today. Pt verbalized understanding.   

## 2015-04-03 ENCOUNTER — Encounter: Payer: Self-pay | Admitting: Cardiology

## 2015-04-07 DIAGNOSIS — G5601 Carpal tunnel syndrome, right upper limb: Secondary | ICD-10-CM | POA: Diagnosis not present

## 2015-04-07 DIAGNOSIS — G5602 Carpal tunnel syndrome, left upper limb: Secondary | ICD-10-CM | POA: Diagnosis not present

## 2015-04-07 DIAGNOSIS — M130 Polyarthritis, unspecified: Secondary | ICD-10-CM | POA: Diagnosis not present

## 2015-04-09 ENCOUNTER — Telehealth: Payer: Self-pay | Admitting: Internal Medicine

## 2015-04-09 NOTE — Telephone Encounter (Signed)
New message ° ° ° ° ° °1. Has your device fired? no °2. Is you device beeping? no ° °3. Are you experiencing draining or swelling at device site? no ° °4. Are you calling to see if we received your device transmission?yes °5. Have you passed out? no °

## 2015-04-09 NOTE — Telephone Encounter (Signed)
Informed patient that remote was not received. Patient states that she will try again.   Will call patient in the am to let her know if it is received.

## 2015-04-10 ENCOUNTER — Ambulatory Visit (INDEPENDENT_AMBULATORY_CARE_PROVIDER_SITE_OTHER): Payer: Medicare Other | Admitting: *Deleted

## 2015-04-10 DIAGNOSIS — I442 Atrioventricular block, complete: Secondary | ICD-10-CM | POA: Diagnosis not present

## 2015-04-10 NOTE — Telephone Encounter (Signed)
Remote was not received. 800# given. 

## 2015-04-10 NOTE — Telephone Encounter (Signed)
°  New Message   4. Are you calling to see if we received your device transmission? YES

## 2015-04-10 NOTE — Telephone Encounter (Signed)
Informed patient that remote was not received. Reviewed correct transmitting technique with patient. Patient voiced understanding and plans to try to send again.

## 2015-04-13 NOTE — Telephone Encounter (Signed)
Informed pt that her remote transmission was received.

## 2015-04-13 NOTE — Progress Notes (Signed)
Remote pacemaker transmission.   

## 2015-04-17 ENCOUNTER — Encounter: Payer: Self-pay | Admitting: Cardiology

## 2015-04-17 LAB — CUP PACEART REMOTE DEVICE CHECK
Brady Statistic AS VS Percent: 0 %
Date Time Interrogation Session: 20170113220909
Implantable Lead Implant Date: 20110818
Implantable Lead Implant Date: 20110818
Implantable Lead Location: 753860
Implantable Lead Model: 5594
Lead Channel Impedance Value: 673 Ohm
Lead Channel Setting Pacing Amplitude: 1.5 V
Lead Channel Setting Pacing Amplitude: 2 V
MDC IDC LEAD LOCATION: 753859
MDC IDC MSMT BATTERY IMPEDANCE: 523 Ohm
MDC IDC MSMT BATTERY REMAINING LONGEVITY: 87 mo
MDC IDC MSMT BATTERY VOLTAGE: 2.8 V
MDC IDC MSMT LEADCHNL RA IMPEDANCE VALUE: 521 Ohm
MDC IDC SET LEADCHNL RV PACING PULSEWIDTH: 0.4 ms
MDC IDC SET LEADCHNL RV SENSING SENSITIVITY: 2.8 mV
MDC IDC STAT BRADY AP VP PERCENT: 77 %
MDC IDC STAT BRADY AP VS PERCENT: 0 %
MDC IDC STAT BRADY AS VP PERCENT: 23 %

## 2015-04-22 DIAGNOSIS — G603 Idiopathic progressive neuropathy: Secondary | ICD-10-CM | POA: Diagnosis not present

## 2015-04-22 DIAGNOSIS — G5601 Carpal tunnel syndrome, right upper limb: Secondary | ICD-10-CM | POA: Diagnosis not present

## 2015-05-01 DIAGNOSIS — G5601 Carpal tunnel syndrome, right upper limb: Secondary | ICD-10-CM | POA: Diagnosis not present

## 2015-05-01 DIAGNOSIS — M79601 Pain in right arm: Secondary | ICD-10-CM | POA: Diagnosis not present

## 2015-05-05 ENCOUNTER — Telehealth: Payer: Self-pay | Admitting: *Deleted

## 2015-05-05 MED ORDER — APIXABAN 2.5 MG PO TABS: 2.5000 mg | ORAL_TABLET | Freq: Two times a day (BID) | ORAL | Status: DC

## 2015-05-05 NOTE — Telephone Encounter (Signed)
Dr.Taylor recommended that patient start Eliquis 2.5mg  BID due to PAF seen on 04/10/15 remote.  Called patient about Dr.Taylor's recommendations. I explained to the pt what AF is and the reason why anticoagulants are needed in patient's who have this. Patient voiced understanding of all information given.  Patient requested that a 30 day supply of Eliquis be sent to her local pharmacy and a 90 day supply be sent to [facility].  Rx SENT.

## 2015-05-06 ENCOUNTER — Telehealth: Payer: Self-pay | Admitting: Cardiology

## 2015-05-06 NOTE — Telephone Encounter (Signed)
Pt called in wanted to know if it is ok to take her digoxin and aspirin with the the eliquis. She said the bottle said not to take aspirin. Informed pt that I would ask MD and call her back w/ an answer. Pt verbalized understanding.

## 2015-05-15 DIAGNOSIS — R350 Frequency of micturition: Secondary | ICD-10-CM | POA: Diagnosis not present

## 2015-05-15 DIAGNOSIS — E039 Hypothyroidism, unspecified: Secondary | ICD-10-CM | POA: Diagnosis not present

## 2015-05-15 DIAGNOSIS — R7309 Other abnormal glucose: Secondary | ICD-10-CM | POA: Diagnosis not present

## 2015-05-15 DIAGNOSIS — E782 Mixed hyperlipidemia: Secondary | ICD-10-CM | POA: Diagnosis not present

## 2015-05-15 DIAGNOSIS — Z6820 Body mass index (BMI) 20.0-20.9, adult: Secondary | ICD-10-CM | POA: Diagnosis not present

## 2015-05-15 DIAGNOSIS — I10 Essential (primary) hypertension: Secondary | ICD-10-CM | POA: Diagnosis not present

## 2015-05-15 DIAGNOSIS — N183 Chronic kidney disease, stage 3 (moderate): Secondary | ICD-10-CM | POA: Diagnosis not present

## 2015-05-15 DIAGNOSIS — Z1389 Encounter for screening for other disorder: Secondary | ICD-10-CM | POA: Diagnosis not present

## 2015-05-15 NOTE — Telephone Encounter (Signed)
I would suggest she stop her ASA and continue Eliquis. GT

## 2015-05-15 NOTE — Telephone Encounter (Signed)
Spoke w/ pt and informed her to stop ASA and continue everything else the same. Pt verbalized understanding.

## 2015-05-15 NOTE — Telephone Encounter (Signed)
LMOVM for pt to return my call.  

## 2015-05-21 ENCOUNTER — Encounter: Payer: Self-pay | Admitting: Internal Medicine

## 2015-06-17 DIAGNOSIS — I10 Essential (primary) hypertension: Secondary | ICD-10-CM | POA: Diagnosis not present

## 2015-06-17 DIAGNOSIS — N183 Chronic kidney disease, stage 3 (moderate): Secondary | ICD-10-CM | POA: Diagnosis not present

## 2015-06-17 DIAGNOSIS — E873 Alkalosis: Secondary | ICD-10-CM | POA: Diagnosis not present

## 2015-06-23 DIAGNOSIS — Z681 Body mass index (BMI) 19 or less, adult: Secondary | ICD-10-CM | POA: Diagnosis not present

## 2015-06-23 DIAGNOSIS — R0602 Shortness of breath: Secondary | ICD-10-CM | POA: Diagnosis not present

## 2015-06-23 DIAGNOSIS — J329 Chronic sinusitis, unspecified: Secondary | ICD-10-CM | POA: Diagnosis not present

## 2015-07-13 ENCOUNTER — Ambulatory Visit (INDEPENDENT_AMBULATORY_CARE_PROVIDER_SITE_OTHER): Payer: Medicare Other | Admitting: *Deleted

## 2015-07-13 DIAGNOSIS — I442 Atrioventricular block, complete: Secondary | ICD-10-CM

## 2015-07-13 NOTE — Progress Notes (Signed)
Remote pacemaker transmission.   

## 2015-07-17 DIAGNOSIS — Z6820 Body mass index (BMI) 20.0-20.9, adult: Secondary | ICD-10-CM | POA: Diagnosis not present

## 2015-07-17 DIAGNOSIS — Z23 Encounter for immunization: Secondary | ICD-10-CM | POA: Diagnosis not present

## 2015-07-17 DIAGNOSIS — Z0000 Encounter for general adult medical examination without abnormal findings: Secondary | ICD-10-CM | POA: Diagnosis not present

## 2015-08-05 DIAGNOSIS — J069 Acute upper respiratory infection, unspecified: Secondary | ICD-10-CM | POA: Diagnosis not present

## 2015-08-05 DIAGNOSIS — Z1389 Encounter for screening for other disorder: Secondary | ICD-10-CM | POA: Diagnosis not present

## 2015-08-05 DIAGNOSIS — Z6820 Body mass index (BMI) 20.0-20.9, adult: Secondary | ICD-10-CM | POA: Diagnosis not present

## 2015-08-19 ENCOUNTER — Encounter: Payer: Self-pay | Admitting: Cardiology

## 2015-08-19 LAB — CUP PACEART REMOTE DEVICE CHECK
Battery Impedance: 598 Ohm
Battery Voltage: 2.8 V
Brady Statistic AP VP Percent: 75 %
Brady Statistic AP VS Percent: 0 %
Brady Statistic AS VP Percent: 25 %
Date Time Interrogation Session: 20170417141300
Implantable Lead Implant Date: 20110818
Implantable Lead Implant Date: 20110818
Implantable Lead Location: 753859
Implantable Lead Model: 5092
Implantable Lead Model: 5594
Lead Channel Impedance Value: 544 Ohm
Lead Channel Pacing Threshold Amplitude: 0.5 V
Lead Channel Pacing Threshold Amplitude: 0.625 V
Lead Channel Pacing Threshold Pulse Width: 0.4 ms
Lead Channel Pacing Threshold Pulse Width: 0.4 ms
Lead Channel Sensing Intrinsic Amplitude: 2.8 mV
Lead Channel Setting Sensing Sensitivity: 2.8 mV
MDC IDC LEAD LOCATION: 753860
MDC IDC MSMT BATTERY REMAINING LONGEVITY: 83 mo
MDC IDC MSMT LEADCHNL RV IMPEDANCE VALUE: 708 Ohm
MDC IDC SET LEADCHNL RA PACING AMPLITUDE: 1.5 V
MDC IDC SET LEADCHNL RV PACING AMPLITUDE: 2 V
MDC IDC SET LEADCHNL RV PACING PULSEWIDTH: 0.4 ms
MDC IDC STAT BRADY AS VS PERCENT: 0 %

## 2015-08-26 DIAGNOSIS — D509 Iron deficiency anemia, unspecified: Secondary | ICD-10-CM | POA: Diagnosis not present

## 2015-08-26 DIAGNOSIS — I10 Essential (primary) hypertension: Secondary | ICD-10-CM | POA: Diagnosis not present

## 2015-08-26 DIAGNOSIS — N183 Chronic kidney disease, stage 3 (moderate): Secondary | ICD-10-CM | POA: Diagnosis not present

## 2015-08-26 DIAGNOSIS — Z79899 Other long term (current) drug therapy: Secondary | ICD-10-CM | POA: Diagnosis not present

## 2015-08-26 DIAGNOSIS — R809 Proteinuria, unspecified: Secondary | ICD-10-CM | POA: Diagnosis not present

## 2015-08-26 DIAGNOSIS — E559 Vitamin D deficiency, unspecified: Secondary | ICD-10-CM | POA: Diagnosis not present

## 2015-09-02 DIAGNOSIS — N183 Chronic kidney disease, stage 3 (moderate): Secondary | ICD-10-CM | POA: Diagnosis not present

## 2015-09-02 DIAGNOSIS — R809 Proteinuria, unspecified: Secondary | ICD-10-CM | POA: Diagnosis not present

## 2015-09-30 DIAGNOSIS — J209 Acute bronchitis, unspecified: Secondary | ICD-10-CM | POA: Diagnosis not present

## 2015-09-30 DIAGNOSIS — Z6821 Body mass index (BMI) 21.0-21.9, adult: Secondary | ICD-10-CM | POA: Diagnosis not present

## 2015-09-30 DIAGNOSIS — E782 Mixed hyperlipidemia: Secondary | ICD-10-CM | POA: Diagnosis not present

## 2015-09-30 DIAGNOSIS — I10 Essential (primary) hypertension: Secondary | ICD-10-CM | POA: Diagnosis not present

## 2015-10-12 ENCOUNTER — Telehealth: Payer: Self-pay | Admitting: Cardiology

## 2015-10-12 ENCOUNTER — Ambulatory Visit (INDEPENDENT_AMBULATORY_CARE_PROVIDER_SITE_OTHER): Payer: Medicare Other | Admitting: *Deleted

## 2015-10-12 DIAGNOSIS — I442 Atrioventricular block, complete: Secondary | ICD-10-CM

## 2015-10-12 NOTE — Telephone Encounter (Signed)
Spoke with pt and reminded pt of remote transmission that is due today. Pt verbalized understanding.   

## 2015-10-13 NOTE — Progress Notes (Signed)
Remote pacemaker transmission.   

## 2015-10-14 ENCOUNTER — Encounter: Payer: Self-pay | Admitting: Cardiology

## 2015-10-15 LAB — CUP PACEART REMOTE DEVICE CHECK
Battery Impedance: 647 Ohm
Battery Remaining Longevity: 80 mo
Battery Voltage: 2.78 V
Brady Statistic AP VP Percent: 77 %
Brady Statistic AS VP Percent: 23 %
Implantable Lead Implant Date: 20110818
Implantable Lead Location: 753860
Implantable Lead Model: 5092
Lead Channel Impedance Value: 537 Ohm
Lead Channel Pacing Threshold Amplitude: 0.5 V
Lead Channel Setting Pacing Amplitude: 1.5 V
Lead Channel Setting Pacing Amplitude: 2 V
Lead Channel Setting Pacing Pulse Width: 0.4 ms
MDC IDC LEAD IMPLANT DT: 20110818
MDC IDC LEAD LOCATION: 753859
MDC IDC MSMT LEADCHNL RA PACING THRESHOLD PULSEWIDTH: 0.4 ms
MDC IDC MSMT LEADCHNL RV IMPEDANCE VALUE: 749 Ohm
MDC IDC MSMT LEADCHNL RV PACING THRESHOLD AMPLITUDE: 0.625 V
MDC IDC MSMT LEADCHNL RV PACING THRESHOLD PULSEWIDTH: 0.4 ms
MDC IDC SESS DTM: 20170717152639
MDC IDC SET LEADCHNL RV SENSING SENSITIVITY: 2.8 mV
MDC IDC STAT BRADY AP VS PERCENT: 0 %
MDC IDC STAT BRADY AS VS PERCENT: 0 %

## 2015-10-19 DIAGNOSIS — H35033 Hypertensive retinopathy, bilateral: Secondary | ICD-10-CM | POA: Diagnosis not present

## 2015-10-19 DIAGNOSIS — H5202 Hypermetropia, left eye: Secondary | ICD-10-CM | POA: Diagnosis not present

## 2015-10-19 DIAGNOSIS — H524 Presbyopia: Secondary | ICD-10-CM | POA: Diagnosis not present

## 2015-10-19 DIAGNOSIS — H52223 Regular astigmatism, bilateral: Secondary | ICD-10-CM | POA: Diagnosis not present

## 2015-11-17 DIAGNOSIS — Z1389 Encounter for screening for other disorder: Secondary | ICD-10-CM | POA: Diagnosis not present

## 2015-11-17 DIAGNOSIS — Z6821 Body mass index (BMI) 21.0-21.9, adult: Secondary | ICD-10-CM | POA: Diagnosis not present

## 2015-11-17 DIAGNOSIS — J029 Acute pharyngitis, unspecified: Secondary | ICD-10-CM | POA: Diagnosis not present

## 2015-11-17 DIAGNOSIS — J069 Acute upper respiratory infection, unspecified: Secondary | ICD-10-CM | POA: Diagnosis not present

## 2015-12-09 ENCOUNTER — Telehealth: Payer: Self-pay | Admitting: Internal Medicine

## 2015-12-09 NOTE — Telephone Encounter (Signed)
She only had to pay $116.00--3 month supply the first 2 times.  This time she got it and it was $ 470 for 3 month supply because she needs a [place].  Please check on this and let the patient know

## 2015-12-09 NOTE — Telephone Encounter (Signed)
New Message:    Pt says she can not afford to pay $400 for 3 months for her Eliquis. Will he please change it to something else.

## 2015-12-10 ENCOUNTER — Telehealth: Payer: Self-pay

## 2015-12-10 NOTE — Telephone Encounter (Signed)
Tier exception for Eliquis 2.5mg  requested from [facility] today.

## 2015-12-10 NOTE — Telephone Encounter (Signed)
Spoke with patient today. She is probably in the donut hole.  I will attempt to get a Tier exception through [facility].

## 2015-12-16 DIAGNOSIS — Z6821 Body mass index (BMI) 21.0-21.9, adult: Secondary | ICD-10-CM | POA: Diagnosis not present

## 2015-12-16 DIAGNOSIS — M1991 Primary osteoarthritis, unspecified site: Secondary | ICD-10-CM | POA: Diagnosis not present

## 2015-12-16 DIAGNOSIS — M25552 Pain in left hip: Secondary | ICD-10-CM | POA: Diagnosis not present

## 2015-12-24 NOTE — Telephone Encounter (Signed)
Error

## 2015-12-28 DIAGNOSIS — Z23 Encounter for immunization: Secondary | ICD-10-CM | POA: Diagnosis not present

## 2016-01-18 ENCOUNTER — Encounter: Payer: Self-pay | Admitting: Internal Medicine

## 2016-01-18 ENCOUNTER — Ambulatory Visit (INDEPENDENT_AMBULATORY_CARE_PROVIDER_SITE_OTHER): Payer: Medicare Other | Admitting: Internal Medicine

## 2016-01-18 VITALS — BP 155/75 | HR 73 | Ht 63.0 in | Wt 125.0 lb

## 2016-01-18 DIAGNOSIS — Z950 Presence of cardiac pacemaker: Secondary | ICD-10-CM

## 2016-01-18 DIAGNOSIS — I442 Atrioventricular block, complete: Secondary | ICD-10-CM | POA: Diagnosis not present

## 2016-01-18 LAB — CUP PACEART INCLINIC DEVICE CHECK
Battery Remaining Longevity: 74 mo
Battery Voltage: 2.79 V
Brady Statistic AP VP Percent: 77 %
Brady Statistic AP VS Percent: 0 %
Brady Statistic AS VP Percent: 23 %
Brady Statistic AS VS Percent: 0 %
Implantable Lead Implant Date: 20110818
Implantable Lead Location: 753859
Implantable Lead Model: 5092
Implantable Lead Model: 5594
Lead Channel Impedance Value: 528 Ohm
Lead Channel Impedance Value: 741 Ohm
Lead Channel Pacing Threshold Amplitude: 0.5 V
Lead Channel Pacing Threshold Amplitude: 0.75 V
Lead Channel Pacing Threshold Pulse Width: 0.4 ms
Lead Channel Sensing Intrinsic Amplitude: 2.8 mV
Lead Channel Setting Pacing Amplitude: 1.5 V
MDC IDC LEAD IMPLANT DT: 20110818
MDC IDC LEAD LOCATION: 753860
MDC IDC MSMT BATTERY IMPEDANCE: 749 Ohm
MDC IDC MSMT LEADCHNL RV PACING THRESHOLD PULSEWIDTH: 0.4 ms
MDC IDC SESS DTM: 20171023133025
MDC IDC SET LEADCHNL RV PACING AMPLITUDE: 2 V
MDC IDC SET LEADCHNL RV PACING PULSEWIDTH: 0.4 ms
MDC IDC SET LEADCHNL RV SENSING SENSITIVITY: 2.8 mV

## 2016-01-18 NOTE — Patient Instructions (Addendum)
Your physician wants you to follow-up in: 1 Year with Dr. Lovena Le. You will receive a reminder letter in the mail two months in advance. If you don't receive a letter, please call our office to schedule the follow-up appointment.  Your physician has recommended you make the following change in your medication:   STOP TAKING Simvastatin   Remote monitoring is used to monitor your Pacemaker of ICD from home. This monitoring reduces the number of office visits required to check your device to one time per year. It allows [place] to keep an eye on the functioning of your device to ensure it is working properly. You are scheduled for a device check from home on 04/18/16 . You may send your transmission at any time that day. If you have a wireless device, the transmission will be sent automatically. After your physician reviews your transmission, you will receive a postcard with your next transmission date.  You have been samples of Eliquis today.   If you need a refill on your cardiac medications before your next appointment, please call your pharmacy.  Thank you for choosing [facility]!

## 2016-01-18 NOTE — Progress Notes (Signed)
HPI [name] returns today for ongoing evaluation and management of her DDD PM. She has a h/o symptomatic bradycardia and underwent PPM insertion in 2003 with generator change in 2011. She has been bothered by thyroid dysfunction in the past. Since I saw her a year ago, she has had problems with leg pain. No chest pain or sob. She wonders if it is due to her cholesterol medication. Also she has had problems with the cost of her Eliquis. Allergies  Allergen Reactions  . Macrodantin Shortness Of Breath  . Sulfa Antibiotics Shortness Of Breath     Current Outpatient Prescriptions  Medication Sig Dispense Refill  . apixaban (ELIQUIS) 2.5 MG TABS tablet Take 1 tablet (2.5 mg total) by mouth 2 (two) times daily. 180 tablet 3  . Calcium Carbonate-Vitamin D 600-400 MG-UNIT chew tablet Chew 1 tablet by mouth every other day.    . digoxin (LANOXIN) 0.125 MG tablet Take 125 mcg by mouth every morning.     Marland Kitchen esomeprazole (NEXIUM) 40 MG capsule Take 40 mg by mouth daily before breakfast.    . ezetimibe-simvastatin (VYTORIN) 10-20 MG per tablet Take 1 tablet by mouth at bedtime.    . furosemide (LASIX) 20 MG tablet Take 20 mg by mouth 2 (two) times a week.     . levocetirizine (XYZAL) 5 MG tablet Take 5 mg by mouth every morning.     Marland Kitchen levothyroxine (SYNTHROID, LEVOTHROID) 100 MCG tablet Take 1 tablet by mouth daily.    Marland Kitchen lisinopril (PRINIVIL,ZESTRIL) 30 MG tablet Take 15 mg by mouth every evening.     . Magnesium 250 MG TABS Take 1 tablet by mouth 2 (two) times daily.     . metoprolol succinate (TOPROL XL) 25 MG 24 hr tablet Take 1 tablet (25 mg total) by mouth daily. 90 tablet 3  . Omega-3 Fatty Acids (FISH OIL) 1000 MG CAPS Take 1,000 mg by mouth daily.    Vladimir Faster Glycol-Propyl Glycol (SYSTANE OP) Place 1 drop into both eyes at bedtime.    . Potassium 99 MG TABS Take 1 tablet by mouth 2 (two) times daily.     . simvastatin (ZOCOR) 20 MG tablet Take 1 tablet by mouth daily.    . VENTOLIN  HFA 108 (90 BASE) MCG/ACT inhaler Inhale 2 puffs into the lungs daily as needed.    . vitamin B-12 (CYANOCOBALAMIN) 1000 MCG tablet Take 1,000 mcg by mouth every morning.     . vitamin C (ASCORBIC ACID) 500 MG tablet Take 500 mg by mouth every morning.      No current facility-administered medications for this visit.      Past Medical History:  Diagnosis Date  . Cardiomyopathy ([place])   . CHF (congestive heart failure) ([place])   . Coronary artery disease   . GERD (gastroesophageal reflux disease)   . History of sick sinus syndrome   . Hypertension   . Hypothyroidism   . Paroxysmal atrial fibrillation (HCC)   . Shortness of breath dyspnea   . Thyroid disease     ROS:   All systems reviewed and negative except as noted in the HPI.   Past Surgical History:  Procedure Laterality Date  . APPENDECTOMY    . CARDIAC CATHETERIZATION  07/25/2005  . CATARACT EXTRACTION W/PHACO Right 08/21/2014   Procedure: CATARACT EXTRACTION PHACO AND INTRAOCULAR LENS PLACEMENT (IOC);  Surgeon: Tonny Branch, MD;  Location: [facility];  Service: Ophthalmology;  Laterality: Right;  CDE 10.94  .  CATARACT EXTRACTION W/PHACO Left 09/08/2014   Procedure: CATARACT EXTRACTION PHACO AND INTRAOCULAR LENS PLACEMENT (IOC);  Surgeon: Tonny Branch, MD;  Location: [facility];  Service: Ophthalmology;  Laterality: Left;  CDE:7.79  . CHOLECYSTECTOMY    . KNEE ARTHROSCOPY Right   . NM MYOVIEW LTD  11/06/2008  . PACEMAKER PLACEMENT       Family History  Problem Relation Age of Onset  . Heart disease    . Cancer    . Kidney disease       Social History   Social History  . Marital status: Widowed    Spouse name: N/A  . Number of children: N/A  . Years of education: N/A   Occupational History  . Not on file.   Social History Main Topics  . Smoking status: Former Smoker    Packs/day: 0.25    Years: 15.00    Types: Cigarettes    Quit date: 08/13/1970  . Smokeless tobacco: Never Used  . Alcohol use No  . Drug use: No    . Sexual activity: No   Other Topics Concern  . Not on file   Social History Narrative  . No narrative on file     BP (!) 155/75   Pulse 73   Ht 5\' 3"  (1.6 m)   Wt 125 lb (56.7 kg)   SpO2 96%   BMI 22.14 kg/m   Physical Exam:  Well appearing elderly woman, NAD HEENT: Unremarkable Neck:  6 cm JVD, no thyromegally Back:  No CVA tenderness Lungs:  Clear with no wheezes. Well healed PPM incision. HEART:  Regular rate rhythm, no murmurs, no rubs, no clicks Abd:  soft, positive bowel sounds, no organomegally, no rebound, no guarding Ext:  2 plus pulses, trace peripheral edema, no cyanosis, no clubbing Skin:  No rashes no nodules Neuro:  CN II through XII intact, motor grossly intact  DEVICE  Normal device function.  See PaceArt for details.   Assess/Plan: 1. Leg pain - I have asked her to stop her statin therapy. If her leg pain is improved, then she will remain off of the statin. If the pain is no better than she should restart the statin. 2. PPM - her medtronic DDD PM is working normally. Will follow. 3. HTN - her blood pressure is up a bit. She is encouraged to maintain a low sodium diet. 4. Atrial fib - her ventricular rate is controlled. She has had problems with the cost of her Eliquis. Will hope to get her some samples to try and get her by until the first of the year.  Mikle Bosworth.D.

## 2016-02-03 DIAGNOSIS — Z6821 Body mass index (BMI) 21.0-21.9, adult: Secondary | ICD-10-CM | POA: Diagnosis not present

## 2016-02-03 DIAGNOSIS — Z1389 Encounter for screening for other disorder: Secondary | ICD-10-CM | POA: Diagnosis not present

## 2016-02-03 DIAGNOSIS — B349 Viral infection, unspecified: Secondary | ICD-10-CM | POA: Diagnosis not present

## 2016-02-03 DIAGNOSIS — I509 Heart failure, unspecified: Secondary | ICD-10-CM | POA: Diagnosis not present

## 2016-02-03 DIAGNOSIS — H6121 Impacted cerumen, right ear: Secondary | ICD-10-CM | POA: Diagnosis not present

## 2016-02-03 DIAGNOSIS — E063 Autoimmune thyroiditis: Secondary | ICD-10-CM | POA: Diagnosis not present

## 2016-02-03 DIAGNOSIS — I10 Essential (primary) hypertension: Secondary | ICD-10-CM | POA: Diagnosis not present

## 2016-02-03 DIAGNOSIS — R070 Pain in throat: Secondary | ICD-10-CM | POA: Diagnosis not present

## 2016-03-03 ENCOUNTER — Encounter: Payer: Self-pay | Admitting: Internal Medicine

## 2016-03-03 DIAGNOSIS — R809 Proteinuria, unspecified: Secondary | ICD-10-CM | POA: Diagnosis not present

## 2016-03-03 DIAGNOSIS — Z79899 Other long term (current) drug therapy: Secondary | ICD-10-CM | POA: Diagnosis not present

## 2016-03-03 DIAGNOSIS — D509 Iron deficiency anemia, unspecified: Secondary | ICD-10-CM | POA: Diagnosis not present

## 2016-03-03 DIAGNOSIS — N183 Chronic kidney disease, stage 3 (moderate): Secondary | ICD-10-CM | POA: Diagnosis not present

## 2016-03-03 DIAGNOSIS — I10 Essential (primary) hypertension: Secondary | ICD-10-CM | POA: Diagnosis not present

## 2016-03-03 DIAGNOSIS — E559 Vitamin D deficiency, unspecified: Secondary | ICD-10-CM | POA: Diagnosis not present

## 2016-03-09 DIAGNOSIS — Z1389 Encounter for screening for other disorder: Secondary | ICD-10-CM | POA: Diagnosis not present

## 2016-03-09 DIAGNOSIS — Z6821 Body mass index (BMI) 21.0-21.9, adult: Secondary | ICD-10-CM | POA: Diagnosis not present

## 2016-03-09 DIAGNOSIS — F419 Anxiety disorder, unspecified: Secondary | ICD-10-CM | POA: Diagnosis not present

## 2016-03-11 DIAGNOSIS — N183 Chronic kidney disease, stage 3 (moderate): Secondary | ICD-10-CM | POA: Diagnosis not present

## 2016-03-11 DIAGNOSIS — I10 Essential (primary) hypertension: Secondary | ICD-10-CM | POA: Diagnosis not present

## 2016-03-11 DIAGNOSIS — R809 Proteinuria, unspecified: Secondary | ICD-10-CM | POA: Diagnosis not present

## 2016-04-04 ENCOUNTER — Other Ambulatory Visit: Payer: Self-pay | Admitting: Internal Medicine

## 2016-04-04 NOTE — Telephone Encounter (Signed)
Rx refill sent to pharmacy. 

## 2016-04-18 ENCOUNTER — Telehealth: Payer: Self-pay | Admitting: Cardiology

## 2016-04-18 ENCOUNTER — Ambulatory Visit (INDEPENDENT_AMBULATORY_CARE_PROVIDER_SITE_OTHER): Payer: Medicare Other | Admitting: *Deleted

## 2016-04-18 DIAGNOSIS — I442 Atrioventricular block, complete: Secondary | ICD-10-CM | POA: Diagnosis not present

## 2016-04-18 NOTE — Telephone Encounter (Signed)
LMOVM reminding pt to send remote transmission.   

## 2016-04-20 ENCOUNTER — Encounter: Payer: Self-pay | Admitting: Cardiology

## 2016-04-20 LAB — CUP PACEART REMOTE DEVICE CHECK
Battery Remaining Longevity: 70 mo
Battery Voltage: 2.79 V
Brady Statistic AS VP Percent: 38 %
Date Time Interrogation Session: 20180123175342
Implantable Lead Implant Date: 20110818
Implantable Lead Location: 753859
Implantable Pulse Generator Implant Date: 20110818
Lead Channel Pacing Threshold Amplitude: 0.5 V
Lead Channel Pacing Threshold Amplitude: 0.75 V
Lead Channel Pacing Threshold Pulse Width: 0.4 ms
Lead Channel Setting Pacing Amplitude: 1.5 V
Lead Channel Setting Pacing Pulse Width: 0.4 ms
Lead Channel Setting Sensing Sensitivity: 2.8 mV
MDC IDC LEAD IMPLANT DT: 20110818
MDC IDC LEAD LOCATION: 753860
MDC IDC MSMT BATTERY IMPEDANCE: 799 Ohm
MDC IDC MSMT LEADCHNL RA IMPEDANCE VALUE: 529 Ohm
MDC IDC MSMT LEADCHNL RA PACING THRESHOLD PULSEWIDTH: 0.4 ms
MDC IDC MSMT LEADCHNL RV IMPEDANCE VALUE: 590 Ohm
MDC IDC SET LEADCHNL RV PACING AMPLITUDE: 2 V
MDC IDC STAT BRADY AP VP PERCENT: 62 %
MDC IDC STAT BRADY AP VS PERCENT: 0 %
MDC IDC STAT BRADY AS VS PERCENT: 0 %

## 2016-04-20 NOTE — Progress Notes (Signed)
Remote pacemaker transmission.   

## 2016-04-26 DIAGNOSIS — I10 Essential (primary) hypertension: Secondary | ICD-10-CM | POA: Diagnosis not present

## 2016-04-26 DIAGNOSIS — Z6821 Body mass index (BMI) 21.0-21.9, adult: Secondary | ICD-10-CM | POA: Diagnosis not present

## 2016-04-26 DIAGNOSIS — K219 Gastro-esophageal reflux disease without esophagitis: Secondary | ICD-10-CM | POA: Diagnosis not present

## 2016-04-26 DIAGNOSIS — J329 Chronic sinusitis, unspecified: Secondary | ICD-10-CM | POA: Diagnosis not present

## 2016-04-26 DIAGNOSIS — J343 Hypertrophy of nasal turbinates: Secondary | ICD-10-CM | POA: Diagnosis not present

## 2016-04-26 DIAGNOSIS — R070 Pain in throat: Secondary | ICD-10-CM | POA: Diagnosis not present

## 2016-04-26 DIAGNOSIS — J209 Acute bronchitis, unspecified: Secondary | ICD-10-CM | POA: Diagnosis not present

## 2016-04-26 DIAGNOSIS — E039 Hypothyroidism, unspecified: Secondary | ICD-10-CM | POA: Diagnosis not present

## 2016-04-26 DIAGNOSIS — J069 Acute upper respiratory infection, unspecified: Secondary | ICD-10-CM | POA: Diagnosis not present

## 2016-04-26 DIAGNOSIS — Z1389 Encounter for screening for other disorder: Secondary | ICD-10-CM | POA: Diagnosis not present

## 2016-04-26 DIAGNOSIS — E782 Mixed hyperlipidemia: Secondary | ICD-10-CM | POA: Diagnosis not present

## 2016-07-04 DIAGNOSIS — Z6821 Body mass index (BMI) 21.0-21.9, adult: Secondary | ICD-10-CM | POA: Diagnosis not present

## 2016-07-04 DIAGNOSIS — N183 Chronic kidney disease, stage 3 (moderate): Secondary | ICD-10-CM | POA: Diagnosis not present

## 2016-07-04 DIAGNOSIS — J209 Acute bronchitis, unspecified: Secondary | ICD-10-CM | POA: Diagnosis not present

## 2016-07-04 DIAGNOSIS — Z950 Presence of cardiac pacemaker: Secondary | ICD-10-CM | POA: Diagnosis not present

## 2016-07-04 DIAGNOSIS — I10 Essential (primary) hypertension: Secondary | ICD-10-CM | POA: Diagnosis not present

## 2016-07-04 DIAGNOSIS — R7309 Other abnormal glucose: Secondary | ICD-10-CM | POA: Diagnosis not present

## 2016-07-04 DIAGNOSIS — E785 Hyperlipidemia, unspecified: Secondary | ICD-10-CM | POA: Diagnosis not present

## 2016-07-04 DIAGNOSIS — I509 Heart failure, unspecified: Secondary | ICD-10-CM | POA: Diagnosis not present

## 2016-07-04 DIAGNOSIS — E039 Hypothyroidism, unspecified: Secondary | ICD-10-CM | POA: Diagnosis not present

## 2016-07-04 DIAGNOSIS — F419 Anxiety disorder, unspecified: Secondary | ICD-10-CM | POA: Diagnosis not present

## 2016-07-18 ENCOUNTER — Ambulatory Visit (INDEPENDENT_AMBULATORY_CARE_PROVIDER_SITE_OTHER): Payer: Medicare Other | Admitting: *Deleted

## 2016-07-18 DIAGNOSIS — I442 Atrioventricular block, complete: Secondary | ICD-10-CM

## 2016-07-19 NOTE — Progress Notes (Signed)
Remote pacemaker transmission.   

## 2016-07-21 ENCOUNTER — Encounter: Payer: Self-pay | Admitting: Cardiology

## 2016-07-21 LAB — CUP PACEART REMOTE DEVICE CHECK
Battery Impedance: 981 Ohm
Brady Statistic AP VP Percent: 66 %
Brady Statistic AP VS Percent: 0 %
Brady Statistic AS VS Percent: 0 %
Date Time Interrogation Session: 20180423135530
Implantable Lead Implant Date: 20110818
Implantable Lead Location: 753860
Implantable Lead Model: 5092
Lead Channel Impedance Value: 553 Ohm
Lead Channel Impedance Value: 678 Ohm
Lead Channel Pacing Threshold Amplitude: 0.625 V
Lead Channel Pacing Threshold Pulse Width: 0.4 ms
Lead Channel Setting Pacing Amplitude: 2 V
Lead Channel Setting Sensing Sensitivity: 2.8 mV
MDC IDC LEAD IMPLANT DT: 20110818
MDC IDC LEAD LOCATION: 753859
MDC IDC MSMT BATTERY REMAINING LONGEVITY: 64 mo
MDC IDC MSMT BATTERY VOLTAGE: 2.79 V
MDC IDC MSMT LEADCHNL RA PACING THRESHOLD AMPLITUDE: 0.5 V
MDC IDC MSMT LEADCHNL RV PACING THRESHOLD PULSEWIDTH: 0.4 ms
MDC IDC PG IMPLANT DT: 20110818
MDC IDC SET LEADCHNL RA PACING AMPLITUDE: 1.5 V
MDC IDC SET LEADCHNL RV PACING PULSEWIDTH: 0.4 ms
MDC IDC STAT BRADY AS VP PERCENT: 34 %

## 2016-08-09 DIAGNOSIS — C44622 Squamous cell carcinoma of skin of right upper limb, including shoulder: Secondary | ICD-10-CM | POA: Diagnosis not present

## 2016-08-09 DIAGNOSIS — L570 Actinic keratosis: Secondary | ICD-10-CM | POA: Diagnosis not present

## 2016-08-09 DIAGNOSIS — L820 Inflamed seborrheic keratosis: Secondary | ICD-10-CM | POA: Diagnosis not present

## 2016-08-09 DIAGNOSIS — X32XXXD Exposure to sunlight, subsequent encounter: Secondary | ICD-10-CM | POA: Diagnosis not present

## 2016-08-19 ENCOUNTER — Telehealth: Payer: Self-pay | Admitting: Internal Medicine

## 2016-08-19 NOTE — Telephone Encounter (Signed)
Called patient back about her message. Patient had a recent skin cancer spot removed. Patient stated the area is about a dime size. Patient stated she took her bandage off and took a shower and bumped the place on her arm some how. Patient stated it looked like a lot of blood. Patient stated it stopped bleeding and she has a bandage on it now. Patient stated she feels fine. Informed patient to be cautious when removing or changing her bandage that she could remove the new scab and her wound could start bleeding again. Informed patient if it starts bleeding again to hold pressure for at least 5 min. Encouraged patient to call the doctor who removed the skin cancer and inform him or her about the situation. Patient verbalized understanding.

## 2016-08-19 NOTE — Telephone Encounter (Signed)
New Message     Pt is on Eliquis and she bumped her arm and had a lot of bleeding last night ,  her blood is really dark is this normal for this medication to do this

## 2016-08-21 NOTE — Telephone Encounter (Signed)
I am not sure how this involves our service. GT

## 2016-08-23 NOTE — Telephone Encounter (Signed)
Patient is on eliquis, and she was worried about taking her eliquis and having this bleed from her skin cancer site.

## 2016-08-25 NOTE — Telephone Encounter (Signed)
She may hold Eliquis if she is having active bleeding. Otherwise she should take the medication. GT

## 2016-08-26 NOTE — Telephone Encounter (Signed)
Called, spoke with pt. Pt stated she has no active bleeding. Inform to continue with Eliquis. Pt verbalized understanding.

## 2016-09-05 DIAGNOSIS — L820 Inflamed seborrheic keratosis: Secondary | ICD-10-CM | POA: Diagnosis not present

## 2016-09-05 DIAGNOSIS — Z85828 Personal history of other malignant neoplasm of skin: Secondary | ICD-10-CM | POA: Diagnosis not present

## 2016-09-05 DIAGNOSIS — Z08 Encounter for follow-up examination after completed treatment for malignant neoplasm: Secondary | ICD-10-CM | POA: Diagnosis not present

## 2016-09-09 DIAGNOSIS — J069 Acute upper respiratory infection, unspecified: Secondary | ICD-10-CM | POA: Diagnosis not present

## 2016-09-09 DIAGNOSIS — Z1389 Encounter for screening for other disorder: Secondary | ICD-10-CM | POA: Diagnosis not present

## 2016-09-09 DIAGNOSIS — J209 Acute bronchitis, unspecified: Secondary | ICD-10-CM | POA: Diagnosis not present

## 2016-09-09 DIAGNOSIS — Z6821 Body mass index (BMI) 21.0-21.9, adult: Secondary | ICD-10-CM | POA: Diagnosis not present

## 2016-09-27 DIAGNOSIS — Z6822 Body mass index (BMI) 22.0-22.9, adult: Secondary | ICD-10-CM | POA: Diagnosis not present

## 2016-09-27 DIAGNOSIS — Z1389 Encounter for screening for other disorder: Secondary | ICD-10-CM | POA: Diagnosis not present

## 2016-09-27 DIAGNOSIS — Z0000 Encounter for general adult medical examination without abnormal findings: Secondary | ICD-10-CM | POA: Diagnosis not present

## 2016-10-17 ENCOUNTER — Ambulatory Visit (INDEPENDENT_AMBULATORY_CARE_PROVIDER_SITE_OTHER): Payer: Medicare Other | Admitting: *Deleted

## 2016-10-17 DIAGNOSIS — N183 Chronic kidney disease, stage 3 (moderate): Secondary | ICD-10-CM | POA: Diagnosis not present

## 2016-10-17 DIAGNOSIS — E559 Vitamin D deficiency, unspecified: Secondary | ICD-10-CM | POA: Diagnosis not present

## 2016-10-17 DIAGNOSIS — I10 Essential (primary) hypertension: Secondary | ICD-10-CM | POA: Diagnosis not present

## 2016-10-17 DIAGNOSIS — I442 Atrioventricular block, complete: Secondary | ICD-10-CM

## 2016-10-17 DIAGNOSIS — D509 Iron deficiency anemia, unspecified: Secondary | ICD-10-CM | POA: Diagnosis not present

## 2016-10-17 DIAGNOSIS — R809 Proteinuria, unspecified: Secondary | ICD-10-CM | POA: Diagnosis not present

## 2016-10-17 DIAGNOSIS — Z79899 Other long term (current) drug therapy: Secondary | ICD-10-CM | POA: Diagnosis not present

## 2016-10-19 NOTE — Progress Notes (Signed)
Remote pacemaker transmission.   

## 2016-10-20 ENCOUNTER — Encounter: Payer: Self-pay | Admitting: Cardiology

## 2016-10-26 DIAGNOSIS — N183 Chronic kidney disease, stage 3 (moderate): Secondary | ICD-10-CM | POA: Diagnosis not present

## 2016-10-26 DIAGNOSIS — R809 Proteinuria, unspecified: Secondary | ICD-10-CM | POA: Diagnosis not present

## 2016-10-26 DIAGNOSIS — I10 Essential (primary) hypertension: Secondary | ICD-10-CM | POA: Diagnosis not present

## 2016-11-05 LAB — CUP PACEART REMOTE DEVICE CHECK
Battery Impedance: 954 Ohm
Brady Statistic AP VP Percent: 70 %
Brady Statistic AS VS Percent: 0 %
Date Time Interrogation Session: 20180724135750
Implantable Lead Implant Date: 20110818
Implantable Lead Model: 5092
Implantable Lead Model: 5594
Implantable Pulse Generator Implant Date: 20110818
Lead Channel Impedance Value: 506 Ohm
Lead Channel Impedance Value: 638 Ohm
Lead Channel Pacing Threshold Amplitude: 0.625 V
Lead Channel Pacing Threshold Pulse Width: 0.4 ms
Lead Channel Setting Sensing Sensitivity: 2.8 mV
MDC IDC LEAD IMPLANT DT: 20110818
MDC IDC LEAD LOCATION: 753859
MDC IDC LEAD LOCATION: 753860
MDC IDC MSMT BATTERY REMAINING LONGEVITY: 64 mo
MDC IDC MSMT BATTERY VOLTAGE: 2.78 V
MDC IDC MSMT LEADCHNL RA PACING THRESHOLD AMPLITUDE: 0.5 V
MDC IDC MSMT LEADCHNL RV PACING THRESHOLD PULSEWIDTH: 0.4 ms
MDC IDC SET LEADCHNL RA PACING AMPLITUDE: 1.5 V
MDC IDC SET LEADCHNL RV PACING AMPLITUDE: 2 V
MDC IDC SET LEADCHNL RV PACING PULSEWIDTH: 0.4 ms
MDC IDC STAT BRADY AP VS PERCENT: 0 %
MDC IDC STAT BRADY AS VP PERCENT: 30 %

## 2016-11-11 DIAGNOSIS — I509 Heart failure, unspecified: Secondary | ICD-10-CM | POA: Diagnosis not present

## 2016-11-11 DIAGNOSIS — R7309 Other abnormal glucose: Secondary | ICD-10-CM | POA: Diagnosis not present

## 2016-11-11 DIAGNOSIS — Z950 Presence of cardiac pacemaker: Secondary | ICD-10-CM | POA: Diagnosis not present

## 2016-11-11 DIAGNOSIS — E782 Mixed hyperlipidemia: Secondary | ICD-10-CM | POA: Diagnosis not present

## 2016-11-11 DIAGNOSIS — I10 Essential (primary) hypertension: Secondary | ICD-10-CM | POA: Diagnosis not present

## 2016-11-11 DIAGNOSIS — E039 Hypothyroidism, unspecified: Secondary | ICD-10-CM | POA: Diagnosis not present

## 2016-11-11 DIAGNOSIS — Z6821 Body mass index (BMI) 21.0-21.9, adult: Secondary | ICD-10-CM | POA: Diagnosis not present

## 2016-11-23 ENCOUNTER — Other Ambulatory Visit: Payer: Self-pay | Admitting: Internal Medicine

## 2016-12-13 ENCOUNTER — Encounter (HOSPITAL_COMMUNITY): Payer: Self-pay | Admitting: Emergency Medicine

## 2016-12-13 ENCOUNTER — Emergency Department (HOSPITAL_COMMUNITY)
Admission: EM | Admit: 2016-12-13 | Discharge: 2016-12-13 | Disposition: A | Discharge status: Home / Self Care | Payer: Medicare Other | Attending: Emergency Medicine | Admitting: Emergency Medicine

## 2016-12-13 ENCOUNTER — Emergency Department (HOSPITAL_COMMUNITY): Payer: Medicare Other

## 2016-12-13 DIAGNOSIS — I509 Heart failure, unspecified: Secondary | ICD-10-CM | POA: Insufficient documentation

## 2016-12-13 DIAGNOSIS — Z87891 Personal history of nicotine dependence: Secondary | ICD-10-CM | POA: Insufficient documentation

## 2016-12-13 DIAGNOSIS — I249 Acute ischemic heart disease, unspecified: Secondary | ICD-10-CM | POA: Insufficient documentation

## 2016-12-13 DIAGNOSIS — Z79899 Other long term (current) drug therapy: Secondary | ICD-10-CM | POA: Diagnosis not present

## 2016-12-13 DIAGNOSIS — I110 Hypertensive heart disease with heart failure: Secondary | ICD-10-CM | POA: Insufficient documentation

## 2016-12-13 DIAGNOSIS — J069 Acute upper respiratory infection, unspecified: Secondary | ICD-10-CM | POA: Diagnosis not present

## 2016-12-13 DIAGNOSIS — R0602 Shortness of breath: Secondary | ICD-10-CM | POA: Diagnosis not present

## 2016-12-13 DIAGNOSIS — E039 Hypothyroidism, unspecified: Secondary | ICD-10-CM | POA: Diagnosis not present

## 2016-12-13 DIAGNOSIS — R079 Chest pain, unspecified: Secondary | ICD-10-CM | POA: Diagnosis not present

## 2016-12-13 DIAGNOSIS — Z950 Presence of cardiac pacemaker: Secondary | ICD-10-CM | POA: Diagnosis not present

## 2016-12-13 LAB — CBC
HCT: 39.1 % (ref 36.0–46.0)
HEMOGLOBIN: 12.7 g/dL (ref 12.0–15.0)
MCH: 30.7 pg (ref 26.0–34.0)
MCHC: 32.5 g/dL (ref 30.0–36.0)
MCV: 94.4 fL (ref 78.0–100.0)
Platelets: 206 10*3/uL (ref 150–400)
RBC: 4.14 MIL/uL (ref 3.87–5.11)
RDW: 13.1 % (ref 11.5–15.5)
WBC: 8.4 10*3/uL (ref 4.0–10.5)

## 2016-12-13 LAB — BASIC METABOLIC PANEL
ANION GAP: 12 (ref 5–15)
BUN: 32 mg/dL — ABNORMAL HIGH (ref 6–20)
CHLORIDE: 99 mmol/L — AB (ref 101–111)
CO2: 28 mmol/L (ref 22–32)
CREATININE: 1.63 mg/dL — AB (ref 0.44–1.00)
Calcium: 9.9 mg/dL (ref 8.9–10.3)
GFR calc non Af Amer: 27 mL/min — ABNORMAL LOW (ref >60)
GFR, EST AFRICAN AMERICAN: 31 mL/min — AB (ref >60)
Glucose, Bld: 128 mg/dL — ABNORMAL HIGH (ref 65–99)
Potassium: 4 mmol/L (ref 3.5–5.1)
Sodium: 139 mmol/L (ref 135–145)

## 2016-12-13 LAB — TROPONIN I: Troponin I: <0.03 ng/mL (ref <0.03)

## 2016-12-13 MED ORDER — AZITHROMYCIN 250 MG PO TABS
500.0000 mg | ORAL_TABLET | Freq: Once | ORAL | Status: AC
  Administered 2016-12-13: 500 mg via ORAL
  Filled 2016-12-13: qty 2

## 2016-12-13 MED ORDER — AZITHROMYCIN 250 MG PO TABS: ORAL_TABLET | ORAL | 0 refills | Status: DC

## 2016-12-13 MED ORDER — DEXAMETHASONE SODIUM PHOSPHATE 4 MG/ML IJ SOLN
4.0000 mg | Freq: Once | INTRAMUSCULAR | Status: AC
  Administered 2016-12-13: 4 mg via INTRAMUSCULAR
  Filled 2016-12-13: qty 1

## 2016-12-13 MED ORDER — IPRATROPIUM-ALBUTEROL 0.5-2.5 (3) MG/3ML IN SOLN
3.0000 mL | Freq: Once | RESPIRATORY_TRACT | Status: AC
  Administered 2016-12-13: 3 mL via RESPIRATORY_TRACT
  Filled 2016-12-13: qty 3

## 2016-12-13 NOTE — ED Provider Notes (Signed)
[facility] DEPT Provider Note   CSN: [id number] Arrival date & time: 12/13/16  1354     History   Chief Complaint Chief Complaint  Patient presents with  . Shortness of Breath    HPI [name] is a 83 y.o. female.  Patient presents with cough productive of pale yellow sputum and dyspnea for 3 days.She lives independently at home and is able to do her ADLs. No fever, sweats, chills, rusty sputum. She complains of chest pain only with cough. She has been drinking fluids. Severity of symptoms is moderate. Nothing makes symptoms better or worse.      Past Medical History:  Diagnosis Date  . Cardiomyopathy ([place])   . CHF (congestive heart failure) ([place])   . Coronary artery disease   . GERD (gastroesophageal reflux disease)   . History of sick sinus syndrome   . Hypertension   . Hypothyroidism   . Paroxysmal atrial fibrillation (H[facility])   . Shortness of breath dyspnea   . Thyroid disease     Patient Active Problem List   Diagnosis Date Noted  . SOB (shortness of breath) 12/26/2014  . Cardiac pacemaker in situ 09/30/2013  . Benign essential HTN 09/30/2013  . Plantar fasciitis of left foot 06/21/2011    Past Surgical History:  Procedure Laterality Date  . APPENDECTOMY    . CARDIAC CATHETERIZATION  07/25/2005  . CATARACT EXTRACTION W/PHACO Right 08/21/2014   Procedure: CATARACT EXTRACTION PHACO AND INTRAOCULAR LENS PLACEMENT (IOC);  Surgeon: Tonny Branch, MD;  Location: [facility];  Service: Ophthalmology;  Laterality: Right;  CDE 10.94  . CATARACT EXTRACTION W/PHACO Left 09/08/2014   Procedure: CATARACT EXTRACTION PHACO AND INTRAOCULAR LENS PLACEMENT (IOC);  Surgeon: Tonny Branch, MD;  Location: [facility];  Service: Ophthalmology;  Laterality: Left;  CDE:7.79  . CHOLECYSTECTOMY    . KNEE ARTHROSCOPY Right   . NM MYOVIEW LTD  11/06/2008  . PACEMAKER PLACEMENT      OB History    Gravida Para Term Preterm AB Living             0   SAB TAB Ectopic Multiple Live Births             Home Medications    Prior to Admission medications   Medication Sig Start Date End Date Taking? Authorizing Provider  Calcium Carbonate-Vitamin D 600-400 MG-UNIT chew tablet Chew 1 tablet by mouth every Saturday.    Yes [provider]  digoxin (LANOXIN) 0.125 MG tablet Take 125 mcg by mouth every morning.    Yes [provider]  ELIQUIS 2.5 MG TABS tablet TAKE 1 TABLET TWICE DAILY 11/23/16  Yes Evans Lance, MD  ezetimibe-simvastatin (VYTORIN) 10-20 MG per tablet Take 1 tablet by mouth at bedtime.   Yes [provider]  furosemide (LASIX) 20 MG tablet Take 20 mg by mouth 2 (two) times a week.    Yes [provider]  levocetirizine (XYZAL) 5 MG tablet Take 5 mg by mouth every morning.    Yes [provider]  levothyroxine (SYNTHROID, LEVOTHROID) 88 MCG tablet Take 88 mcg by mouth daily before breakfast.  12/06/16  Yes [provider]  lisinopril (PRINIVIL,ZESTRIL) 30 MG tablet Take 15 mg by mouth every evening.    Yes [provider]  metoprolol succinate (TOPROL-XL) 50 MG 24 hr tablet Take 50 mg by mouth daily.  11/08/16  Yes [provider]  Omega-3 Fatty Acids (FISH OIL) 1000 MG CAPS Take 1,000 mg by mouth daily.  Yes [provider]  Polyethyl Glycol-Propyl Glycol (SYSTANE OP) Place 1 drop into both eyes at bedtime.   Yes [provider]  Potassium 99 MG TABS Take 1 tablet by mouth 2 (two) times daily.    Yes [provider]  VENTOLIN HFA 108 (90 BASE) MCG/ACT inhaler Inhale 2 puffs into the lungs every 4 (four) hours as needed for wheezing or shortness of breath.  07/04/14  Yes [provider]  vitamin C (ASCORBIC ACID) 500 MG tablet Take 500 mg by mouth every morning.    Yes [provider]  azithromycin (ZITHROMAX) 250 MG tablet 1 tablet daily for 4 days starting tomorrow at dinnertime 12/13/16   Nat Christen, MD    Family History Family History  Problem  Relation Age of Onset  . Heart disease Unknown   . Cancer Unknown   . Kidney disease Unknown     Social History Social History  Substance Use Topics  . Smoking status: Former Smoker    Packs/day: 0.25    Years: 15.00    Types: Cigarettes    Quit date: 08/13/1970  . Smokeless tobacco: Never Used  . Alcohol use No     Allergies   Macrodantin and Sulfa antibiotics   Review of Systems Review of Systems  All other systems reviewed and are negative.    Physical Exam Updated Vital Signs BP (!) 166/61 (BP Location: Left Arm)   Pulse 71   Temp (!) 97 F (36.1 C) (Temporal)   Resp 20   Ht 5\' 3"  (1.6 m)   Wt 53.5 kg (118 lb)   SpO2 93%   BMI 20.90 kg/m   Physical Exam  Constitutional: She is oriented to person, place, and time.  Alert, good color, no significant dyspnea.  HENT:  Head: Normocephalic and atraumatic.  Eyes: Conjunctivae are normal.  Neck: Neck supple.  Cardiovascular: Normal rate and regular rhythm.   Pulmonary/Chest: Effort normal.  Scattered rhonchi  Abdominal: Soft. Bowel sounds are normal.  Musculoskeletal: Normal range of motion.  Neurological: She is alert and oriented to person, place, and time.  Skin: Skin is warm and dry.  Psychiatric: She has a normal mood and affect. Her behavior is normal.  Nursing note and vitals reviewed.    ED Treatments / Results  Labs (all labs ordered are listed, but only abnormal results are displayed) Labs Reviewed  BASIC METABOLIC PANEL - Abnormal; Notable for the following:       Result Value   Chloride 99 (*)    Glucose, Bld 128 (*)    BUN 32 (*)    Creatinine, Ser 1.63 (*)    GFR calc non Af Amer 27 (*)    GFR calc Af Amer 31 (*)    All other components within normal limits  CBC  TROPONIN I    EKG  EKG Interpretation None       Radiology Dg Chest 2 View  Result Date: 12/13/2016 CLINICAL DATA:  Short of breath, chest pain over the last week, former smoking history EXAM: CHEST  2 VIEW  COMPARISON:  Chest x-ray of 04/28/2014 FINDINGS: The lungs are very hyperaerated with flattened hemidiaphragms and increased AP diameter consistent with emphysematous change. No pneumonia or effusion is seen. Mediastinal and hilar contours are unremarkable. The heart is within normal limits in size. A permanent pacemaker remains. The bones are somewhat osteopenic with slight thoracic kyphosis. Moderate thoracic aortic atherosclerosis is noted. IMPRESSION: 1. Probable emphysema with hyperaeration as noted above.  2. No pneumonia or effusion. 3. Moderate thoracic aortic atherosclerosis. 4. Permanent pacemaker. Electronically Signed   By: Ivar Drape M.D.   On: 12/13/2016 14:28    Procedures Procedures (including critical care time)  Medications Ordered in ED Medications  ipratropium-albuterol (DUONEB) 0.5-2.5 (3) MG/3ML nebulizer solution 3 mL (3 mLs Nebulization Given 12/13/16 1942)  azithromycin (ZITHROMAX) tablet 500 mg (500 mg Oral Given 12/13/16 1901)  dexamethasone (DECADRON) injection 4 mg (4 mg Intramuscular Given 12/13/16 1901)     Initial Impression / Assessment and Plan / ED Course  I have reviewed the triage vital signs and the nursing notes.  Pertinent labs & imaging results that were available during my care of the patient were reviewed by me and considered in my medical decision making (see chart for details).     History and physical most consistent with upper respiratory infection. Will start antibiotic secondary to patient's immunocompromise state. Also, patient was given an injection of Decadron. Encouraged use of inhaler at home. She understands to return if worse.  Final Clinical Impressions(s) / ED Diagnoses   Final diagnoses:  Upper respiratory tract infection, unspecified type    New Prescriptions New Prescriptions   AZITHROMYCIN (ZITHROMAX) 250 MG TABLET    1 tablet daily for 4 days starting tomorrow at Billee Cashing, MD 12/13/16 2019

## 2016-12-13 NOTE — Discharge Instructions (Signed)
Your chest x-ray showed no pneumonia. I suspect you have bad upper respiratory infection. We start an antibiotics tonight. Continue antibiotic tomorrow evening. You were also given a shot of steroid which should help your breathing. Use your inhaler as needed. Return if worse.

## 2016-12-13 NOTE — ED Triage Notes (Signed)
SOB x 3 days, cough with pale yellow sputum, chest pain

## 2016-12-13 NOTE — ED Notes (Signed)
Pt denies any pain.   Pt no visible distress.

## 2016-12-13 NOTE — ED Notes (Signed)
Pt alert & oriented x4, stable gait. Patient given discharge instructions, paperwork & prescription(s). Patient verbalized understanding. Pt left department in wheelchair escorted by staff. Pt left w/ no further questions. 

## 2016-12-16 DIAGNOSIS — Z1379 Encounter for other screening for genetic and chromosomal anomalies: Secondary | ICD-10-CM | POA: Diagnosis not present

## 2016-12-16 DIAGNOSIS — Z800 Family history of malignant neoplasm of digestive organs: Secondary | ICD-10-CM | POA: Diagnosis not present

## 2016-12-23 DIAGNOSIS — J069 Acute upper respiratory infection, unspecified: Secondary | ICD-10-CM | POA: Diagnosis not present

## 2016-12-23 DIAGNOSIS — N183 Chronic kidney disease, stage 3 (moderate): Secondary | ICD-10-CM | POA: Diagnosis not present

## 2016-12-23 DIAGNOSIS — Z6822 Body mass index (BMI) 22.0-22.9, adult: Secondary | ICD-10-CM | POA: Diagnosis not present

## 2016-12-23 DIAGNOSIS — J209 Acute bronchitis, unspecified: Secondary | ICD-10-CM | POA: Diagnosis not present

## 2016-12-29 DIAGNOSIS — N183 Chronic kidney disease, stage 3 (moderate): Secondary | ICD-10-CM | POA: Diagnosis not present

## 2016-12-29 DIAGNOSIS — J069 Acute upper respiratory infection, unspecified: Secondary | ICD-10-CM | POA: Diagnosis not present

## 2016-12-29 DIAGNOSIS — J209 Acute bronchitis, unspecified: Secondary | ICD-10-CM | POA: Diagnosis not present

## 2016-12-29 DIAGNOSIS — Z6822 Body mass index (BMI) 22.0-22.9, adult: Secondary | ICD-10-CM | POA: Diagnosis not present

## 2017-01-10 DIAGNOSIS — Z23 Encounter for immunization: Secondary | ICD-10-CM | POA: Diagnosis not present

## 2017-01-16 ENCOUNTER — Ambulatory Visit (INDEPENDENT_AMBULATORY_CARE_PROVIDER_SITE_OTHER): Payer: Medicare Other | Admitting: *Deleted

## 2017-01-16 DIAGNOSIS — I442 Atrioventricular block, complete: Secondary | ICD-10-CM | POA: Diagnosis not present

## 2017-01-16 NOTE — Progress Notes (Signed)
Remote pacemaker transmission.   

## 2017-01-18 LAB — CUP PACEART REMOTE DEVICE CHECK
Battery Impedance: 1059 Ohm
Brady Statistic AP VS Percent: 0 %
Brady Statistic AS VS Percent: 0 %
Date Time Interrogation Session: 20181022142811
Implantable Lead Implant Date: 20110818
Implantable Lead Location: 753859
Implantable Lead Model: 5092
Implantable Pulse Generator Implant Date: 20110818
Lead Channel Impedance Value: 646 Ohm
Lead Channel Pacing Threshold Amplitude: 0.625 V
Lead Channel Pacing Threshold Pulse Width: 0.4 ms
Lead Channel Pacing Threshold Pulse Width: 0.4 ms
Lead Channel Setting Pacing Amplitude: 2 V
Lead Channel Setting Sensing Sensitivity: 2.8 mV
MDC IDC LEAD IMPLANT DT: 20110818
MDC IDC LEAD LOCATION: 753860
MDC IDC MSMT BATTERY REMAINING LONGEVITY: 60 mo
MDC IDC MSMT BATTERY VOLTAGE: 2.79 V
MDC IDC MSMT LEADCHNL RA IMPEDANCE VALUE: 562 Ohm
MDC IDC MSMT LEADCHNL RA PACING THRESHOLD AMPLITUDE: 0.5 V
MDC IDC SET LEADCHNL RA PACING AMPLITUDE: 1.5 V
MDC IDC SET LEADCHNL RV PACING PULSEWIDTH: 0.4 ms
MDC IDC STAT BRADY AP VP PERCENT: 72 %
MDC IDC STAT BRADY AS VP PERCENT: 28 %

## 2017-01-20 ENCOUNTER — Encounter: Payer: Self-pay | Admitting: Cardiology

## 2017-01-27 ENCOUNTER — Encounter: Payer: Self-pay | Admitting: Internal Medicine

## 2017-01-27 ENCOUNTER — Ambulatory Visit (INDEPENDENT_AMBULATORY_CARE_PROVIDER_SITE_OTHER): Payer: Medicare Other | Admitting: Internal Medicine

## 2017-01-27 VITALS — BP 134/64 | HR 87 | Ht 63.0 in | Wt 118.0 lb

## 2017-01-27 DIAGNOSIS — I442 Atrioventricular block, complete: Secondary | ICD-10-CM | POA: Diagnosis not present

## 2017-01-27 LAB — CUP PACEART INCLINIC DEVICE CHECK
Battery Impedance: 1058 Ohm
Battery Remaining Longevity: 61 mo
Battery Voltage: 2.78 V
Brady Statistic AP VP Percent: 72 %
Date Time Interrogation Session: 20181102120850
Implantable Lead Implant Date: 20110818
Implantable Lead Location: 753860
Implantable Lead Model: 5092
Implantable Pulse Generator Implant Date: 20110818
Lead Channel Impedance Value: 597 Ohm
Lead Channel Impedance Value: 648 Ohm
Lead Channel Pacing Threshold Amplitude: 0.5 V
Lead Channel Sensing Intrinsic Amplitude: 8 mV
Lead Channel Setting Pacing Amplitude: 1.5 V
Lead Channel Setting Sensing Sensitivity: 2.8 mV
MDC IDC LEAD IMPLANT DT: 20110818
MDC IDC LEAD LOCATION: 753859
MDC IDC MSMT LEADCHNL RA PACING THRESHOLD AMPLITUDE: 0.5 V
MDC IDC MSMT LEADCHNL RA PACING THRESHOLD PULSEWIDTH: 0.4 ms
MDC IDC MSMT LEADCHNL RA PACING THRESHOLD PULSEWIDTH: 0.4 ms
MDC IDC MSMT LEADCHNL RA SENSING INTR AMPL: 2.8 mV
MDC IDC MSMT LEADCHNL RV PACING THRESHOLD AMPLITUDE: 0.625 V
MDC IDC MSMT LEADCHNL RV PACING THRESHOLD AMPLITUDE: 0.75 V
MDC IDC MSMT LEADCHNL RV PACING THRESHOLD PULSEWIDTH: 0.4 ms
MDC IDC MSMT LEADCHNL RV PACING THRESHOLD PULSEWIDTH: 0.4 ms
MDC IDC SET LEADCHNL RV PACING AMPLITUDE: 2 V
MDC IDC SET LEADCHNL RV PACING PULSEWIDTH: 0.4 ms
MDC IDC STAT BRADY AP VS PERCENT: 0 %
MDC IDC STAT BRADY AS VP PERCENT: 28 %
MDC IDC STAT BRADY AS VS PERCENT: 0 %

## 2017-01-27 NOTE — Patient Instructions (Signed)
Medication Instructions:  Your physician recommends that you continue on your current medications as directed. Please refer to the Current Medication list given to you today.   Labwork: NONE   Testing/Procedures: NONE   Follow-Up: Your physician wants you to follow-up in: 1 Year with Dr. Lovena Le.  You will receive a reminder letter in the mail two months in advance. If you don't receive a letter, please call our office to schedule the follow-up appointment.   Any Other Special Instructions Will Be Listed Below (If Applicable).  You have been given samples of Eliquis 2.5 mg Lot [id number]   If you need a refill on your cardiac medications before your next appointment, please call your pharmacy. Thank you for choosing [facility]!

## 2017-01-27 NOTE — Progress Notes (Signed)
HPI [name] returns today for ongoing evaluation and management of her DDD PM. She has a h/o symptomatic bradycardia due to CHB and underwent PPM insertion in 2003 with generator change in 2011. She has been bothered by thyroid dysfunction in the past. Since I saw her a year ago, she has had problems with leg pain. No chest pain or sob.  Also she continues to have problems with the cost of her Eliquis. Allergies  Allergen Reactions  . Macrodantin Shortness Of Breath  . Sulfa Antibiotics Shortness Of Breath     Current Outpatient Prescriptions  Medication Sig Dispense Refill  . azithromycin (ZITHROMAX) 250 MG tablet 1 tablet daily for 4 days starting tomorrow at dinnertime 4 each 0  . Calcium Carbonate-Vitamin D 600-400 MG-UNIT chew tablet Chew 1 tablet by mouth every Saturday.     . digoxin (LANOXIN) 0.125 MG tablet Take 125 mcg by mouth every morning.     Marland Kitchen ELIQUIS 2.5 MG TABS tablet TAKE 1 TABLET TWICE DAILY 180 tablet 0  . ezetimibe-simvastatin (VYTORIN) 10-20 MG per tablet Take 1 tablet by mouth at bedtime.    . furosemide (LASIX) 20 MG tablet Take 20 mg by mouth 2 (two) times a week.     . levocetirizine (XYZAL) 5 MG tablet Take 5 mg by mouth every morning.     Marland Kitchen levothyroxine (SYNTHROID, LEVOTHROID) 88 MCG tablet Take 88 mcg by mouth daily before breakfast.     . lisinopril (PRINIVIL,ZESTRIL) 30 MG tablet Take 15 mg by mouth every evening.     . metoprolol succinate (TOPROL-XL) 50 MG 24 hr tablet Take 50 mg by mouth daily.     . Omega-3 Fatty Acids (FISH OIL) 1000 MG CAPS Take 1,000 mg by mouth daily.    Vladimir Faster Glycol-Propyl Glycol (SYSTANE OP) Place 1 drop into both eyes at bedtime.    . Potassium 99 MG TABS Take 1 tablet by mouth 2 (two) times daily.     . VENTOLIN HFA 108 (90 BASE) MCG/ACT inhaler Inhale 2 puffs into the lungs every 4 (four) hours as needed for wheezing or shortness of breath.     . vitamin C (ASCORBIC ACID) 500 MG tablet Take 500 mg by mouth every  morning.      No current facility-administered medications for this visit.      Past Medical History:  Diagnosis Date  . Cardiomyopathy ([place])   . CHF (congestive heart failure) ([place])   . Coronary artery disease   . GERD (gastroesophageal reflux disease)   . History of sick sinus syndrome   . Hypertension   . Hypothyroidism   . Paroxysmal atrial fibrillation (H[facility])   . Shortness of breath dyspnea   . Thyroid disease     ROS:   All systems reviewed and negative except as noted in the HPI.   Past Surgical History:  Procedure Laterality Date  . APPENDECTOMY    . CARDIAC CATHETERIZATION  07/25/2005  . CATARACT EXTRACTION W/PHACO Right 08/21/2014   Procedure: CATARACT EXTRACTION PHACO AND INTRAOCULAR LENS PLACEMENT (IOC);  Surgeon: Tonny Branch, MD;  Location: [facility];  Service: Ophthalmology;  Laterality: Right;  CDE 10.94  . CATARACT EXTRACTION W/PHACO Left 09/08/2014   Procedure: CATARACT EXTRACTION PHACO AND INTRAOCULAR LENS PLACEMENT (IOC);  Surgeon: Tonny Branch, MD;  Location: [facility];  Service: Ophthalmology;  Laterality: Left;  CDE:7.79  . CHOLECYSTECTOMY    . KNEE ARTHROSCOPY Right   . NM MYOVIEW LTD  11/06/2008  .  PACEMAKER PLACEMENT       Family History  Problem Relation Age of Onset  . Heart disease Unknown   . Cancer Unknown   . Kidney disease Unknown      Social History   Social History  . Marital status: Widowed    Spouse name: N/A  . Number of children: N/A  . Years of education: N/A   Occupational History  . Not on file.   Social History Main Topics  . Smoking status: Former Smoker    Packs/day: 0.25    Years: 15.00    Types: Cigarettes    Quit date: 08/13/1970  . Smokeless tobacco: Never Used  . Alcohol use No  . Drug use: No  . Sexual activity: No   Other Topics Concern  . Not on file   Social History Narrative  . No narrative on file     BP 134/64 (BP Location: Left Arm)   Pulse 87   Ht 5\' 3"  (1.6 m)   Wt 118 lb (53.5 kg)   SpO2 95%    BMI 20.90 kg/m   Physical Exam:  Well appearing NAD HEENT: Unremarkable Neck:  6 cm JVD, no thyromegally Lymphatics:  No adenopathy Back:  No CVA tenderness Lungs:  Clear, with no wheezes, rales, or rhonchi HEART:  Regular rate rhythm, no murmurs, no rubs, no clicks Abd:  soft, positive bowel sounds, no organomegally, no rebound, no guarding Ext:  2 plus pulses, no edema, no cyanosis, no clubbing Skin:  No rashes no nodules Neuro:  CN II through XII intact, motor grossly intact  DEVICE  Normal device function.  See PaceArt for details.   Assess/Plan: 1. Complete heart block - she is stable status post pacemaker insertion. 2. Paroxysmal atrial fibrillation - she is maintaining sinus rhythm. She will continue her current medical therapy 3. Anticoagulation - she is tolerating oral anticoagulation quite nicely. She has had trouble with the cost of her prescriptions. We will give her some samples today. We discussed the possibility of switching to warfarin. She is reflecting. 4. Pacemaker - her Medtronic dual-chamber pacemaker is working normally. She has proximal 25 years of battery longevity.  Cristopher Peru, M.D.

## 2017-02-08 DIAGNOSIS — C44519 Basal cell carcinoma of skin of other part of trunk: Secondary | ICD-10-CM | POA: Diagnosis not present

## 2017-02-08 DIAGNOSIS — D2111 Benign neoplasm of connective and other soft tissue of right upper limb, including shoulder: Secondary | ICD-10-CM | POA: Diagnosis not present

## 2017-02-08 DIAGNOSIS — Z85828 Personal history of other malignant neoplasm of skin: Secondary | ICD-10-CM | POA: Diagnosis not present

## 2017-02-08 DIAGNOSIS — D225 Melanocytic nevi of trunk: Secondary | ICD-10-CM | POA: Diagnosis not present

## 2017-02-08 DIAGNOSIS — D2361 Other benign neoplasm of skin of right upper limb, including shoulder: Secondary | ICD-10-CM | POA: Diagnosis not present

## 2017-02-08 DIAGNOSIS — Z08 Encounter for follow-up examination after completed treatment for malignant neoplasm: Secondary | ICD-10-CM | POA: Diagnosis not present

## 2017-03-08 DIAGNOSIS — Z6822 Body mass index (BMI) 22.0-22.9, adult: Secondary | ICD-10-CM | POA: Diagnosis not present

## 2017-03-08 DIAGNOSIS — Z1389 Encounter for screening for other disorder: Secondary | ICD-10-CM | POA: Diagnosis not present

## 2017-03-08 DIAGNOSIS — J22 Unspecified acute lower respiratory infection: Secondary | ICD-10-CM | POA: Diagnosis not present

## 2017-03-08 DIAGNOSIS — N342 Other urethritis: Secondary | ICD-10-CM | POA: Diagnosis not present

## 2017-03-09 DIAGNOSIS — Z9849 Cataract extraction status, unspecified eye: Secondary | ICD-10-CM | POA: Diagnosis not present

## 2017-03-09 DIAGNOSIS — H3530 Unspecified macular degeneration: Secondary | ICD-10-CM | POA: Diagnosis not present

## 2017-03-09 DIAGNOSIS — H35033 Hypertensive retinopathy, bilateral: Secondary | ICD-10-CM | POA: Diagnosis not present

## 2017-03-09 DIAGNOSIS — Z961 Presence of intraocular lens: Secondary | ICD-10-CM | POA: Diagnosis not present

## 2017-04-11 DIAGNOSIS — R05 Cough: Secondary | ICD-10-CM | POA: Diagnosis not present

## 2017-04-11 DIAGNOSIS — J019 Acute sinusitis, unspecified: Secondary | ICD-10-CM | POA: Diagnosis not present

## 2017-04-11 DIAGNOSIS — R51 Headache: Secondary | ICD-10-CM | POA: Diagnosis not present

## 2017-04-11 DIAGNOSIS — J449 Chronic obstructive pulmonary disease, unspecified: Secondary | ICD-10-CM | POA: Diagnosis not present

## 2017-04-11 DIAGNOSIS — R0602 Shortness of breath: Secondary | ICD-10-CM | POA: Diagnosis not present

## 2017-04-11 DIAGNOSIS — Z6821 Body mass index (BMI) 21.0-21.9, adult: Secondary | ICD-10-CM | POA: Diagnosis not present

## 2017-04-17 ENCOUNTER — Ambulatory Visit (INDEPENDENT_AMBULATORY_CARE_PROVIDER_SITE_OTHER): Payer: Medicare HMO | Admitting: *Deleted

## 2017-04-17 DIAGNOSIS — I442 Atrioventricular block, complete: Secondary | ICD-10-CM

## 2017-04-17 NOTE — Progress Notes (Signed)
Remote pacemaker transmission.   

## 2017-04-18 ENCOUNTER — Encounter: Payer: Self-pay | Admitting: Cardiology

## 2017-04-19 LAB — CUP PACEART REMOTE DEVICE CHECK
Battery Impedance: 1138 Ohm
Brady Statistic AP VP Percent: 76 %
Brady Statistic AP VS Percent: 0 %
Brady Statistic AS VS Percent: 0 %
Date Time Interrogation Session: 20190121160708
Implantable Lead Implant Date: 20110818
Implantable Lead Location: 753859
Implantable Lead Model: 5092
Implantable Lead Model: 5594
Lead Channel Impedance Value: 659 Ohm
Lead Channel Pacing Threshold Pulse Width: 0.4 ms
Lead Channel Pacing Threshold Pulse Width: 0.4 ms
MDC IDC LEAD IMPLANT DT: 20110818
MDC IDC LEAD LOCATION: 753860
MDC IDC MSMT BATTERY REMAINING LONGEVITY: 58 mo
MDC IDC MSMT BATTERY VOLTAGE: 2.79 V
MDC IDC MSMT LEADCHNL RA IMPEDANCE VALUE: 563 Ohm
MDC IDC MSMT LEADCHNL RA PACING THRESHOLD AMPLITUDE: 0.5 V
MDC IDC MSMT LEADCHNL RV PACING THRESHOLD AMPLITUDE: 0.625 V
MDC IDC PG IMPLANT DT: 20110818
MDC IDC SET LEADCHNL RA PACING AMPLITUDE: 1.5 V
MDC IDC SET LEADCHNL RV PACING AMPLITUDE: 2 V
MDC IDC SET LEADCHNL RV PACING PULSEWIDTH: 0.4 ms
MDC IDC SET LEADCHNL RV SENSING SENSITIVITY: 2.8 mV
MDC IDC STAT BRADY AS VP PERCENT: 24 %

## 2017-05-31 DIAGNOSIS — J019 Acute sinusitis, unspecified: Secondary | ICD-10-CM | POA: Diagnosis not present

## 2017-05-31 DIAGNOSIS — R51 Headache: Secondary | ICD-10-CM | POA: Diagnosis not present

## 2017-05-31 DIAGNOSIS — H9202 Otalgia, left ear: Secondary | ICD-10-CM | POA: Diagnosis not present

## 2017-05-31 DIAGNOSIS — Z6821 Body mass index (BMI) 21.0-21.9, adult: Secondary | ICD-10-CM | POA: Diagnosis not present

## 2017-07-13 ENCOUNTER — Other Ambulatory Visit: Payer: Self-pay

## 2017-07-13 ENCOUNTER — Other Ambulatory Visit: Payer: Self-pay | Admitting: Internal Medicine

## 2017-07-13 MED ORDER — DIGOXIN 125 MCG PO TABS: 125.0000 ug | ORAL_TABLET | Freq: Every morning | ORAL | 3 refills | Status: DC

## 2017-07-13 MED ORDER — APIXABAN 2.5 MG PO TABS: 2.5000 mg | ORAL_TABLET | Freq: Two times a day (BID) | ORAL | 3 refills | Status: DC

## 2017-07-13 NOTE — Telephone Encounter (Signed)
[facility] pharmacists w/ [facility] called requesting a   90 day supply of  digoxin (LANOXIN) 0.125 MG tablet [[id number]] &   ELIQUIS 2.5 MG TABS tablet [[id number]]    [phone]

## 2017-07-13 NOTE — Telephone Encounter (Signed)
Sent in RX's.

## 2017-07-17 ENCOUNTER — Telehealth: Payer: Self-pay | Admitting: Cardiology

## 2017-07-17 ENCOUNTER — Ambulatory Visit (INDEPENDENT_AMBULATORY_CARE_PROVIDER_SITE_OTHER): Payer: Medicare Other | Admitting: *Deleted

## 2017-07-17 DIAGNOSIS — I442 Atrioventricular block, complete: Secondary | ICD-10-CM | POA: Diagnosis not present

## 2017-07-17 NOTE — Telephone Encounter (Signed)
Spoke with pt and reminded pt of remote transmission that is due today. Pt verbalized understanding.   

## 2017-07-17 NOTE — Progress Notes (Signed)
Remote pacemaker transmission.   

## 2017-07-18 ENCOUNTER — Encounter: Payer: Self-pay | Admitting: Cardiology

## 2017-07-18 LAB — CUP PACEART REMOTE DEVICE CHECK
Brady Statistic AP VS Percent: 0 %
Brady Statistic AS VS Percent: 0 %
Date Time Interrogation Session: 20190422194208
Implantable Lead Implant Date: 20110818
Implantable Lead Location: 753859
Lead Channel Impedance Value: 712 Ohm
Lead Channel Pacing Threshold Amplitude: 0.625 V
Lead Channel Pacing Threshold Pulse Width: 0.4 ms
Lead Channel Pacing Threshold Pulse Width: 0.4 ms
Lead Channel Setting Pacing Pulse Width: 0.4 ms
Lead Channel Setting Sensing Sensitivity: 2.8 mV
MDC IDC LEAD IMPLANT DT: 20110818
MDC IDC LEAD LOCATION: 753860
MDC IDC MSMT BATTERY IMPEDANCE: 1328 Ohm
MDC IDC MSMT BATTERY REMAINING LONGEVITY: 52 mo
MDC IDC MSMT BATTERY VOLTAGE: 2.78 V
MDC IDC MSMT LEADCHNL RA IMPEDANCE VALUE: 580 Ohm
MDC IDC MSMT LEADCHNL RA PACING THRESHOLD AMPLITUDE: 0.5 V
MDC IDC PG IMPLANT DT: 20110818
MDC IDC SET LEADCHNL RA PACING AMPLITUDE: 1.5 V
MDC IDC SET LEADCHNL RV PACING AMPLITUDE: 2 V
MDC IDC STAT BRADY AP VP PERCENT: 80 %
MDC IDC STAT BRADY AS VP PERCENT: 20 %

## 2017-07-31 ENCOUNTER — Other Ambulatory Visit (HOSPITAL_COMMUNITY): Payer: Self-pay | Admitting: Family Medicine

## 2017-07-31 ENCOUNTER — Ambulatory Visit (HOSPITAL_COMMUNITY)
Admission: RE | Admit: 2017-07-31 | Discharge: 2017-07-31 | Disposition: A | Discharge status: Home / Self Care | Payer: Medicare Other | Source: Ambulatory Visit | Attending: Family Medicine | Admitting: Family Medicine

## 2017-07-31 DIAGNOSIS — Z6821 Body mass index (BMI) 21.0-21.9, adult: Secondary | ICD-10-CM | POA: Diagnosis not present

## 2017-07-31 DIAGNOSIS — R918 Other nonspecific abnormal finding of lung field: Secondary | ICD-10-CM | POA: Insufficient documentation

## 2017-07-31 DIAGNOSIS — R05 Cough: Secondary | ICD-10-CM | POA: Diagnosis not present

## 2017-07-31 DIAGNOSIS — J069 Acute upper respiratory infection, unspecified: Secondary | ICD-10-CM

## 2017-07-31 DIAGNOSIS — R0602 Shortness of breath: Secondary | ICD-10-CM | POA: Diagnosis not present

## 2017-09-13 DIAGNOSIS — J019 Acute sinusitis, unspecified: Secondary | ICD-10-CM | POA: Diagnosis not present

## 2017-09-13 DIAGNOSIS — Z6821 Body mass index (BMI) 21.0-21.9, adult: Secondary | ICD-10-CM | POA: Diagnosis not present

## 2017-09-13 DIAGNOSIS — J301 Allergic rhinitis due to pollen: Secondary | ICD-10-CM | POA: Diagnosis not present

## 2017-10-03 DIAGNOSIS — Z6821 Body mass index (BMI) 21.0-21.9, adult: Secondary | ICD-10-CM | POA: Diagnosis not present

## 2017-10-03 DIAGNOSIS — Z1389 Encounter for screening for other disorder: Secondary | ICD-10-CM | POA: Diagnosis not present

## 2017-10-03 DIAGNOSIS — E039 Hypothyroidism, unspecified: Secondary | ICD-10-CM | POA: Diagnosis not present

## 2017-10-03 DIAGNOSIS — K219 Gastro-esophageal reflux disease without esophagitis: Secondary | ICD-10-CM | POA: Diagnosis not present

## 2017-10-03 DIAGNOSIS — R7309 Other abnormal glucose: Secondary | ICD-10-CM | POA: Diagnosis not present

## 2017-10-03 DIAGNOSIS — Z0000 Encounter for general adult medical examination without abnormal findings: Secondary | ICD-10-CM | POA: Diagnosis not present

## 2017-10-16 ENCOUNTER — Telehealth: Payer: Self-pay

## 2017-10-16 ENCOUNTER — Ambulatory Visit (INDEPENDENT_AMBULATORY_CARE_PROVIDER_SITE_OTHER): Payer: Medicare Other | Admitting: *Deleted

## 2017-10-16 DIAGNOSIS — I442 Atrioventricular block, complete: Secondary | ICD-10-CM

## 2017-10-16 NOTE — Telephone Encounter (Signed)
Spoke with pt and reminded pt of remote transmission that is due today. Pt verbalized understanding.   

## 2017-10-17 ENCOUNTER — Encounter: Payer: Self-pay | Admitting: Cardiology

## 2017-10-17 NOTE — Progress Notes (Signed)
Remote pacemaker transmission.   

## 2017-10-30 DIAGNOSIS — J189 Pneumonia, unspecified organism: Secondary | ICD-10-CM | POA: Diagnosis not present

## 2017-10-30 DIAGNOSIS — Z6821 Body mass index (BMI) 21.0-21.9, adult: Secondary | ICD-10-CM | POA: Diagnosis not present

## 2017-10-30 DIAGNOSIS — R0600 Dyspnea, unspecified: Secondary | ICD-10-CM | POA: Diagnosis not present

## 2017-11-11 LAB — CUP PACEART REMOTE DEVICE CHECK
Battery Impedance: 1273 Ohm
Battery Remaining Longevity: 52 mo
Battery Voltage: 2.77 V
Brady Statistic AS VP Percent: 20 %
Implantable Lead Implant Date: 20110818
Implantable Lead Location: 753859
Implantable Lead Location: 753860
Implantable Lead Model: 5594
Implantable Pulse Generator Implant Date: 20110818
Lead Channel Pacing Threshold Amplitude: 0.5 V
Lead Channel Pacing Threshold Pulse Width: 0.4 ms
Lead Channel Setting Pacing Amplitude: 1.5 V
Lead Channel Setting Pacing Amplitude: 2 V
Lead Channel Setting Pacing Pulse Width: 0.4 ms
MDC IDC LEAD IMPLANT DT: 20110818
MDC IDC MSMT LEADCHNL RA IMPEDANCE VALUE: 514 Ohm
MDC IDC MSMT LEADCHNL RA PACING THRESHOLD PULSEWIDTH: 0.4 ms
MDC IDC MSMT LEADCHNL RV IMPEDANCE VALUE: 629 Ohm
MDC IDC MSMT LEADCHNL RV PACING THRESHOLD AMPLITUDE: 0.625 V
MDC IDC SESS DTM: 20190722194912
MDC IDC SET LEADCHNL RV SENSING SENSITIVITY: 2.8 mV
MDC IDC STAT BRADY AP VP PERCENT: 80 %
MDC IDC STAT BRADY AP VS PERCENT: 0 %
MDC IDC STAT BRADY AS VS PERCENT: 0 %

## 2018-01-15 ENCOUNTER — Ambulatory Visit (INDEPENDENT_AMBULATORY_CARE_PROVIDER_SITE_OTHER): Payer: Medicare Other | Admitting: *Deleted

## 2018-01-15 ENCOUNTER — Telehealth: Payer: Self-pay

## 2018-01-15 DIAGNOSIS — I442 Atrioventricular block, complete: Secondary | ICD-10-CM

## 2018-01-15 NOTE — Telephone Encounter (Signed)
Both numbers on file for patient is disconnected.

## 2018-01-16 ENCOUNTER — Encounter: Payer: Self-pay | Admitting: Cardiology

## 2018-01-16 NOTE — Progress Notes (Signed)
Remote pacemaker transmission.   

## 2018-02-06 LAB — CUP PACEART REMOTE DEVICE CHECK
Battery Impedance: 1437 Ohm
Battery Voltage: 2.77 V
Brady Statistic AP VP Percent: 79 %
Brady Statistic AP VS Percent: 0 %
Brady Statistic AS VP Percent: 21 %
Brady Statistic AS VS Percent: 0 %
Date Time Interrogation Session: 20191022140454
Implantable Lead Implant Date: 20110818
Implantable Lead Implant Date: 20110818
Implantable Lead Location: 753859
Implantable Lead Model: 5092
Implantable Lead Model: 5594
Lead Channel Impedance Value: 493 Ohm
Lead Channel Impedance Value: 628 Ohm
Lead Channel Pacing Threshold Amplitude: 0.5 V
Lead Channel Pacing Threshold Amplitude: 0.625 V
Lead Channel Pacing Threshold Pulse Width: 0.4 ms
Lead Channel Sensing Intrinsic Amplitude: 2.8 mV
Lead Channel Setting Pacing Amplitude: 1.5 V
Lead Channel Setting Pacing Amplitude: 2 V
MDC IDC LEAD LOCATION: 753860
MDC IDC MSMT BATTERY REMAINING LONGEVITY: 48 mo
MDC IDC MSMT LEADCHNL RV PACING THRESHOLD PULSEWIDTH: 0.4 ms
MDC IDC PG IMPLANT DT: 20110818
MDC IDC SET LEADCHNL RV PACING PULSEWIDTH: 0.4 ms
MDC IDC SET LEADCHNL RV SENSING SENSITIVITY: 2.8 mV

## 2018-02-07 DIAGNOSIS — Z9841 Cataract extraction status, right eye: Secondary | ICD-10-CM | POA: Diagnosis not present

## 2018-02-07 DIAGNOSIS — H3530 Unspecified macular degeneration: Secondary | ICD-10-CM | POA: Diagnosis not present

## 2018-02-07 DIAGNOSIS — H26492 Other secondary cataract, left eye: Secondary | ICD-10-CM | POA: Diagnosis not present

## 2018-02-07 DIAGNOSIS — Z961 Presence of intraocular lens: Secondary | ICD-10-CM | POA: Diagnosis not present

## 2018-02-15 DIAGNOSIS — J209 Acute bronchitis, unspecified: Secondary | ICD-10-CM | POA: Diagnosis not present

## 2018-02-15 DIAGNOSIS — Z6821 Body mass index (BMI) 21.0-21.9, adult: Secondary | ICD-10-CM | POA: Diagnosis not present

## 2018-02-15 DIAGNOSIS — J22 Unspecified acute lower respiratory infection: Secondary | ICD-10-CM | POA: Diagnosis not present

## 2018-03-01 ENCOUNTER — Encounter: Payer: Medicare Other | Admitting: Internal Medicine

## 2018-03-05 ENCOUNTER — Other Ambulatory Visit: Payer: Self-pay | Admitting: Internal Medicine

## 2018-03-22 DIAGNOSIS — H26492 Other secondary cataract, left eye: Secondary | ICD-10-CM | POA: Diagnosis not present

## 2018-03-22 DIAGNOSIS — H353134 Nonexudative age-related macular degeneration, bilateral, advanced atrophic with subfoveal involvement: Secondary | ICD-10-CM | POA: Diagnosis not present

## 2018-03-27 DIAGNOSIS — J019 Acute sinusitis, unspecified: Secondary | ICD-10-CM | POA: Diagnosis not present

## 2018-03-27 DIAGNOSIS — Z1389 Encounter for screening for other disorder: Secondary | ICD-10-CM | POA: Diagnosis not present

## 2018-03-27 DIAGNOSIS — Z6821 Body mass index (BMI) 21.0-21.9, adult: Secondary | ICD-10-CM | POA: Diagnosis not present

## 2018-03-27 DIAGNOSIS — J301 Allergic rhinitis due to pollen: Secondary | ICD-10-CM | POA: Diagnosis not present

## 2018-04-02 DIAGNOSIS — Z6821 Body mass index (BMI) 21.0-21.9, adult: Secondary | ICD-10-CM | POA: Diagnosis not present

## 2018-04-02 DIAGNOSIS — J069 Acute upper respiratory infection, unspecified: Secondary | ICD-10-CM | POA: Diagnosis not present

## 2018-04-11 ENCOUNTER — Encounter: Payer: Self-pay | Admitting: Internal Medicine

## 2018-04-16 ENCOUNTER — Ambulatory Visit (INDEPENDENT_AMBULATORY_CARE_PROVIDER_SITE_OTHER): Payer: Medicare Other

## 2018-04-16 DIAGNOSIS — I442 Atrioventricular block, complete: Secondary | ICD-10-CM

## 2018-04-17 LAB — CUP PACEART REMOTE DEVICE CHECK
Battery Impedance: 1519 Ohm
Brady Statistic AP VP Percent: 79 %
Brady Statistic AP VS Percent: 0 %
Brady Statistic AS VP Percent: 21 %
Brady Statistic AS VS Percent: 0 %
Date Time Interrogation Session: 20200120163514
Implantable Lead Implant Date: 20110818
Implantable Pulse Generator Implant Date: 20110818
Lead Channel Impedance Value: 472 Ohm
Lead Channel Impedance Value: 620 Ohm
Lead Channel Pacing Threshold Amplitude: 0.625 V
Lead Channel Pacing Threshold Pulse Width: 0.4 ms
Lead Channel Setting Sensing Sensitivity: 2.8 mV
MDC IDC LEAD IMPLANT DT: 20110818
MDC IDC LEAD LOCATION: 753859
MDC IDC LEAD LOCATION: 753860
MDC IDC MSMT BATTERY REMAINING LONGEVITY: 45 mo
MDC IDC MSMT BATTERY VOLTAGE: 2.77 V
MDC IDC MSMT LEADCHNL RA PACING THRESHOLD AMPLITUDE: 0.5 V
MDC IDC MSMT LEADCHNL RV PACING THRESHOLD PULSEWIDTH: 0.4 ms
MDC IDC SET LEADCHNL RA PACING AMPLITUDE: 1.5 V
MDC IDC SET LEADCHNL RV PACING AMPLITUDE: 2 V
MDC IDC SET LEADCHNL RV PACING PULSEWIDTH: 0.4 ms

## 2018-04-17 NOTE — Progress Notes (Signed)
Remote pacemaker transmission.   

## 2018-04-19 DIAGNOSIS — E7849 Other hyperlipidemia: Secondary | ICD-10-CM | POA: Diagnosis not present

## 2018-04-19 DIAGNOSIS — N183 Chronic kidney disease, stage 3 (moderate): Secondary | ICD-10-CM | POA: Diagnosis not present

## 2018-04-19 DIAGNOSIS — Z6821 Body mass index (BMI) 21.0-21.9, adult: Secondary | ICD-10-CM | POA: Diagnosis not present

## 2018-04-19 DIAGNOSIS — J449 Chronic obstructive pulmonary disease, unspecified: Secondary | ICD-10-CM | POA: Diagnosis not present

## 2018-04-24 DIAGNOSIS — Z1389 Encounter for screening for other disorder: Secondary | ICD-10-CM | POA: Diagnosis not present

## 2018-04-24 DIAGNOSIS — I509 Heart failure, unspecified: Secondary | ICD-10-CM | POA: Diagnosis not present

## 2018-04-24 DIAGNOSIS — I10 Essential (primary) hypertension: Secondary | ICD-10-CM | POA: Diagnosis not present

## 2018-04-24 DIAGNOSIS — E782 Mixed hyperlipidemia: Secondary | ICD-10-CM | POA: Diagnosis not present

## 2018-05-29 ENCOUNTER — Other Ambulatory Visit: Payer: Self-pay | Admitting: Internal Medicine

## 2018-06-12 ENCOUNTER — Encounter: Payer: Medicare Other | Admitting: Internal Medicine

## 2018-06-20 ENCOUNTER — Telehealth: Payer: Self-pay

## 2018-06-21 ENCOUNTER — Encounter: Payer: Medicare Other | Admitting: Internal Medicine

## 2018-06-29 NOTE — Telephone Encounter (Signed)
Did not mean to open phone note. 

## 2018-07-16 ENCOUNTER — Other Ambulatory Visit: Payer: Self-pay

## 2018-07-16 ENCOUNTER — Ambulatory Visit (INDEPENDENT_AMBULATORY_CARE_PROVIDER_SITE_OTHER): Payer: Medicare Other | Admitting: *Deleted

## 2018-07-16 DIAGNOSIS — I442 Atrioventricular block, complete: Secondary | ICD-10-CM | POA: Diagnosis not present

## 2018-07-16 LAB — CUP PACEART REMOTE DEVICE CHECK
Battery Impedance: 1692 Ohm
Battery Remaining Longevity: 42 mo
Battery Voltage: 2.77 V
Brady Statistic AP VP Percent: 80 %
Brady Statistic AP VS Percent: 0 %
Brady Statistic AS VP Percent: 20 %
Brady Statistic AS VS Percent: 0 %
Date Time Interrogation Session: 20200420171207
Implantable Lead Implant Date: 20110818
Implantable Lead Implant Date: 20110818
Implantable Lead Location: 753859
Implantable Lead Location: 753860
Implantable Lead Model: 5092
Implantable Lead Model: 5594
Implantable Pulse Generator Implant Date: 20110818
Lead Channel Impedance Value: 516 Ohm
Lead Channel Impedance Value: 643 Ohm
Lead Channel Pacing Threshold Amplitude: 0.5 V
Lead Channel Pacing Threshold Amplitude: 0.625 V
Lead Channel Pacing Threshold Pulse Width: 0.4 ms
Lead Channel Pacing Threshold Pulse Width: 0.4 ms
Lead Channel Setting Pacing Amplitude: 1.5 V
Lead Channel Setting Pacing Amplitude: 2 V
Lead Channel Setting Pacing Pulse Width: 0.4 ms
Lead Channel Setting Sensing Sensitivity: 2.8 mV

## 2018-07-17 DIAGNOSIS — J069 Acute upper respiratory infection, unspecified: Secondary | ICD-10-CM | POA: Diagnosis not present

## 2018-07-17 DIAGNOSIS — Z6821 Body mass index (BMI) 21.0-21.9, adult: Secondary | ICD-10-CM | POA: Diagnosis not present

## 2018-07-25 NOTE — Progress Notes (Signed)
Remote pacemaker transmission.   

## 2018-08-16 ENCOUNTER — Telehealth: Payer: Self-pay | Admitting: Internal Medicine

## 2018-08-16 NOTE — Telephone Encounter (Signed)

## 2018-08-22 ENCOUNTER — Telehealth (INDEPENDENT_AMBULATORY_CARE_PROVIDER_SITE_OTHER): Payer: Medicare Other | Admitting: Internal Medicine

## 2018-08-22 ENCOUNTER — Other Ambulatory Visit: Payer: Self-pay

## 2018-08-22 DIAGNOSIS — I442 Atrioventricular block, complete: Secondary | ICD-10-CM

## 2018-08-22 NOTE — Patient Instructions (Signed)
Medication Instructions:  Your physician recommends that you continue on your current medications as directed. Please refer to the Current Medication list given to you today.   Labwork: NONE  Testing/Procedures: NONE   Follow-Up: Your physician wants you to follow-up in: 6 Months with Dr. Taylor.  You will receive a reminder letter in the mail two months in advance. If you don't receive a letter, please call our office to schedule the follow-up appointment.   Any Other Special Instructions Will Be Listed Below (If Applicable).     If you need a refill on your cardiac medications before your next appointment, please call your pharmacy.  Thank you for choosing [facility]!   

## 2018-08-22 NOTE — Progress Notes (Signed)
Electrophysiology TeleHealth Note   Due to national recommendations of social distancing due to COVID 19, an audio/video telehealth visit is felt to be most appropriate for this patient at this time.  See MyChart message from today for the patient's consent to telehealth for [facility].   Date:  08/22/2018   ID:  [name], DOB [birth year], MRN [id number]  Location: patient's home  Provider location: [address] [place]  Evaluation Performed: Follow-up visit  PCP:  Sharilyn Sites, MD  Cardiologist:  No primary care provider on file.  Electrophysiologist:  Dr Lovena Le  Chief Complaint:  "I've been alright."  History of Present Illness:    [name] is a 85 y.o. female who presents via audio/video conferencing for a telehealth visit today.  Since last being seen in our clinic, the patient reports doing very well.  Today, she denies symptoms of palpitations, chest pain, shortness of breath,  lower extremity edema, dizziness, presyncope, or syncope.  The patient is otherwise without complaint today.  The patient denies symptoms of fevers, chills, cough, or new SOB worrisome for COVID 19.  Past Medical History:  Diagnosis Date   Cardiomyopathy [facility])    CHF (congestive heart failure) (HCC)    Coronary artery disease    GERD (gastroesophageal reflux disease)    History of sick sinus syndrome    Hypertension    Hypothyroidism    Paroxysmal atrial fibrillation (HCC)    Shortness of breath dyspnea    Thyroid disease     Past Surgical History:  Procedure Laterality Date   APPENDECTOMY     CARDIAC CATHETERIZATION  07/25/2005   CATARACT EXTRACTION W/PHACO Right 08/21/2014   Procedure: CATARACT EXTRACTION PHACO AND INTRAOCULAR LENS PLACEMENT ([place]);  Surgeon: Tonny Branch, MD;  Location: [facility];  Service: Ophthalmology;  Laterality: Right;  CDE 10.94   CATARACT EXTRACTION W/PHACO Left 09/08/2014   Procedure: CATARACT EXTRACTION PHACO AND INTRAOCULAR  LENS PLACEMENT (I[facility]);  Surgeon: Tonny Branch, MD;  Location: [facility];  Service: Ophthalmology;  Laterality: Left;  CDE:7.79   CHOLECYSTECTOMY     KNEE ARTHROSCOPY Right    NM MYOVIEW LTD  11/06/2008   PACEMAKER PLACEMENT      Current Outpatient Medications  Medication Sig Dispense Refill   apixaban (ELIQUIS) 2.5 MG TABS tablet Take 1 tablet (2.5 mg total) by mouth 2 (two) times daily. 180 tablet 3   Calcium Carbonate-Vitamin D 600-400 MG-UNIT chew tablet Chew 1 tablet by mouth every Saturday.      digoxin (LANOXIN) 0.125 MG tablet TAKE 1 TABLET BY MOUTH EVERY MORNING 90 tablet 0   furosemide (LASIX) 20 MG tablet Take 20 mg by mouth 2 (two) times a week.      levocetirizine (XYZAL) 5 MG tablet Take 5 mg by mouth every morning.      levothyroxine (SYNTHROID, LEVOTHROID) 88 MCG tablet Take 88 mcg by mouth daily before breakfast.      lisinopril (ZESTRIL) 10 MG tablet TK 1 T PO D     metoprolol succinate (TOPROL-XL) 50 MG 24 hr tablet Take 50 mg by mouth daily.      Omega-3 Fatty Acids (FISH OIL) 1000 MG CAPS Take 1,000 mg by mouth daily.     Polyethyl Glycol-Propyl Glycol (SYSTANE OP) Place 1 drop into both eyes 3 (three) times daily.      Potassium 99 MG TABS Take 1 tablet by mouth 2 (two) times daily.      VENTOLIN HFA 108 (  90 BASE) MCG/ACT inhaler Inhale 2 puffs into the lungs every 4 (four) hours as needed for wheezing or shortness of breath.      vitamin C (ASCORBIC ACID) 500 MG tablet Take 500 mg by mouth every morning.      No current facility-administered medications for this visit.     Allergies:   Macrodantin and Sulfa antibiotics   Social History:  The patient  reports that she quit smoking about 48 years ago. Her smoking use included cigarettes. She has a 3.75 pack-year smoking history. She has never used smokeless tobacco. She reports that she does not drink alcohol or use drugs.   Family History:  The patient's  family history includes Cancer in her unknown  relative; Heart disease in her unknown relative; Kidney disease in her unknown relative.   ROS:  Please see the history of present illness.   All other systems are personally reviewed and negative.    Exam:    Vital Signs:  bp - 143/80, p -60, Wt. - 112 lb   Labs/Other Tests and Data Reviewed:    Recent Labs: No results found for requested labs within last 8760 hours.   Wt Readings from Last 3 Encounters:  01/27/17 118 lb (53.5 kg)  12/13/16 118 lb (53.5 kg)  01/18/16 125 lb (56.7 kg)     Other studies personally reviewed: Additional studies/ records that were reviewed   Last device remote is reviewed from [place] PDF dated 4/20 which reveals normal device function, no arrhythmias except for a few hours of atrial fibrillation   ASSESSMENT & PLAN:    1.  PAF - she is asymptomatic. She will continue her eliquis. She is in rhythm 99%.  2. Coags - she will continue her eliquis for now though. 3. PPM - her [employer] DDD PM is working normally and she has over 3 years on the battery. 4. COVID 19 screen The patient denies symptoms of COVID 19 at this time.  The importance of social distancing was discussed today.  Follow-up:  6 months with me Next remote: 7/20  Current medicines are reviewed at length with the patient today.   The patient does not have concerns regarding her medicines.  The following changes were made today:  none  Labs/ tests ordered today include: none No orders of the defined types were placed in this encounter.    Patient Risk:  after full review of this patients clinical status, I feel that they are at moderate risk at this time.  Today, I have spent 25 minutes with the patient with telehealth technology discussing all of the above .    Signed, Cristopher Peru, MD  08/22/2018 10:17 AM     [address] [place] [address] [phone] (office) [phone] (fax)

## 2018-09-04 ENCOUNTER — Other Ambulatory Visit: Payer: Self-pay | Admitting: Internal Medicine

## 2018-09-04 NOTE — Telephone Encounter (Signed)
Age 85, weight 53.5kg, SCr 1.13 at [facility] in Jan 2020. Afib indication, last OV 07/2018.

## 2018-09-10 ENCOUNTER — Telehealth: Payer: Self-pay | Admitting: Internal Medicine

## 2018-09-10 MED ORDER — APIXABAN 2.5 MG PO TABS: 2.5000 mg | ORAL_TABLET | Freq: Two times a day (BID) | ORAL | 3 refills | Status: DC

## 2018-09-10 NOTE — Telephone Encounter (Signed)
Pt called stating she received a letter from her pharmacy stating that they're needing a new Rx for her Eliquis sent in and for 90 day supply

## 2018-09-10 NOTE — Telephone Encounter (Signed)
90day supply sent.

## 2018-10-02 DIAGNOSIS — I10 Essential (primary) hypertension: Secondary | ICD-10-CM | POA: Diagnosis not present

## 2018-10-02 DIAGNOSIS — E785 Hyperlipidemia, unspecified: Secondary | ICD-10-CM | POA: Diagnosis not present

## 2018-10-02 DIAGNOSIS — R809 Proteinuria, unspecified: Secondary | ICD-10-CM | POA: Diagnosis not present

## 2018-10-02 DIAGNOSIS — Z79899 Other long term (current) drug therapy: Secondary | ICD-10-CM | POA: Diagnosis not present

## 2018-10-02 DIAGNOSIS — E559 Vitamin D deficiency, unspecified: Secondary | ICD-10-CM | POA: Diagnosis not present

## 2018-10-02 DIAGNOSIS — E039 Hypothyroidism, unspecified: Secondary | ICD-10-CM | POA: Diagnosis not present

## 2018-10-02 DIAGNOSIS — N183 Chronic kidney disease, stage 3 (moderate): Secondary | ICD-10-CM | POA: Diagnosis not present

## 2018-10-09 DIAGNOSIS — R7309 Other abnormal glucose: Secondary | ICD-10-CM | POA: Diagnosis not present

## 2018-10-09 DIAGNOSIS — Z0001 Encounter for general adult medical examination with abnormal findings: Secondary | ICD-10-CM | POA: Diagnosis not present

## 2018-10-09 DIAGNOSIS — I509 Heart failure, unspecified: Secondary | ICD-10-CM | POA: Diagnosis not present

## 2018-10-09 DIAGNOSIS — E039 Hypothyroidism, unspecified: Secondary | ICD-10-CM | POA: Diagnosis not present

## 2018-10-15 ENCOUNTER — Ambulatory Visit (INDEPENDENT_AMBULATORY_CARE_PROVIDER_SITE_OTHER): Payer: Medicare Other | Admitting: *Deleted

## 2018-10-15 DIAGNOSIS — I442 Atrioventricular block, complete: Secondary | ICD-10-CM

## 2018-10-15 LAB — CUP PACEART REMOTE DEVICE CHECK
Battery Impedance: 1776 Ohm
Battery Remaining Longevity: 40 mo
Battery Voltage: 2.77 V
Brady Statistic AP VP Percent: 81 %
Brady Statistic AP VS Percent: 0 %
Brady Statistic AS VP Percent: 19 %
Brady Statistic AS VS Percent: 0 %
Date Time Interrogation Session: 20200720120511
Implantable Lead Implant Date: 20110818
Implantable Lead Implant Date: 20110818
Implantable Lead Location: 753859
Implantable Lead Location: 753860
Implantable Lead Model: 5092
Implantable Lead Model: 5594
Implantable Pulse Generator Implant Date: 20110818
Lead Channel Impedance Value: 547 Ohm
Lead Channel Impedance Value: 628 Ohm
Lead Channel Pacing Threshold Amplitude: 0.5 V
Lead Channel Pacing Threshold Amplitude: 0.75 V
Lead Channel Pacing Threshold Pulse Width: 0.4 ms
Lead Channel Pacing Threshold Pulse Width: 0.4 ms
Lead Channel Setting Pacing Amplitude: 1.5 V
Lead Channel Setting Pacing Amplitude: 2 V
Lead Channel Setting Pacing Pulse Width: 0.4 ms
Lead Channel Setting Sensing Sensitivity: 2.8 mV

## 2018-10-31 ENCOUNTER — Encounter: Payer: Self-pay | Admitting: Cardiology

## 2018-10-31 NOTE — Progress Notes (Signed)
Remote pacemaker transmission.   

## 2018-12-04 DIAGNOSIS — L820 Inflamed seborrheic keratosis: Secondary | ICD-10-CM | POA: Diagnosis not present

## 2018-12-10 DIAGNOSIS — J069 Acute upper respiratory infection, unspecified: Secondary | ICD-10-CM | POA: Diagnosis not present

## 2018-12-10 DIAGNOSIS — Z6821 Body mass index (BMI) 21.0-21.9, adult: Secondary | ICD-10-CM | POA: Diagnosis not present

## 2018-12-11 ENCOUNTER — Other Ambulatory Visit: Payer: Self-pay

## 2018-12-11 DIAGNOSIS — R6889 Other general symptoms and signs: Secondary | ICD-10-CM | POA: Diagnosis not present

## 2018-12-11 DIAGNOSIS — Z20822 Contact with and (suspected) exposure to covid-19: Secondary | ICD-10-CM

## 2018-12-13 LAB — NOVEL CORONAVIRUS, NAA: SARS-CoV-2, NAA: NOT DETECTED

## 2019-01-14 ENCOUNTER — Ambulatory Visit (INDEPENDENT_AMBULATORY_CARE_PROVIDER_SITE_OTHER): Payer: Medicare Other | Admitting: *Deleted

## 2019-01-14 DIAGNOSIS — I495 Sick sinus syndrome: Secondary | ICD-10-CM

## 2019-01-15 DIAGNOSIS — Z23 Encounter for immunization: Secondary | ICD-10-CM | POA: Diagnosis not present

## 2019-01-15 LAB — CUP PACEART REMOTE DEVICE CHECK
Battery Impedance: 1892 Ohm
Battery Remaining Longevity: 37 mo
Battery Voltage: 2.76 V
Brady Statistic AP VP Percent: 82 %
Brady Statistic AP VS Percent: 0 %
Brady Statistic AS VP Percent: 18 %
Brady Statistic AS VS Percent: 0 %
Date Time Interrogation Session: 20201019141436
Implantable Lead Implant Date: 20110818
Implantable Lead Implant Date: 20110818
Implantable Lead Location: 753859
Implantable Lead Location: 753860
Implantable Lead Model: 5092
Implantable Lead Model: 5594
Implantable Pulse Generator Implant Date: 20110818
Lead Channel Impedance Value: 501 Ohm
Lead Channel Impedance Value: 633 Ohm
Lead Channel Pacing Threshold Amplitude: 0.375 V
Lead Channel Pacing Threshold Amplitude: 0.625 V
Lead Channel Pacing Threshold Pulse Width: 0.4 ms
Lead Channel Pacing Threshold Pulse Width: 0.4 ms
Lead Channel Setting Pacing Amplitude: 1.5 V
Lead Channel Setting Pacing Amplitude: 2 V
Lead Channel Setting Pacing Pulse Width: 0.4 ms
Lead Channel Setting Sensing Sensitivity: 2.8 mV

## 2019-02-05 NOTE — Progress Notes (Signed)
Remote pacemaker transmission.   

## 2019-02-19 DIAGNOSIS — M2011 Hallux valgus (acquired), right foot: Secondary | ICD-10-CM | POA: Diagnosis not present

## 2019-02-19 DIAGNOSIS — M7731 Calcaneal spur, right foot: Secondary | ICD-10-CM | POA: Diagnosis not present

## 2019-02-19 DIAGNOSIS — M79671 Pain in right foot: Secondary | ICD-10-CM | POA: Diagnosis not present

## 2019-03-19 DIAGNOSIS — M1990 Unspecified osteoarthritis, unspecified site: Secondary | ICD-10-CM | POA: Diagnosis not present

## 2019-03-19 DIAGNOSIS — M722 Plantar fascial fibromatosis: Secondary | ICD-10-CM | POA: Diagnosis not present

## 2019-03-19 DIAGNOSIS — M25579 Pain in unspecified ankle and joints of unspecified foot: Secondary | ICD-10-CM | POA: Diagnosis not present

## 2019-03-19 DIAGNOSIS — M79671 Pain in right foot: Secondary | ICD-10-CM | POA: Diagnosis not present

## 2019-03-26 ENCOUNTER — Other Ambulatory Visit: Payer: Self-pay

## 2019-03-26 ENCOUNTER — Ambulatory Visit: Payer: Medicare Other | Attending: Internal Medicine

## 2019-03-26 DIAGNOSIS — Z20822 Contact with and (suspected) exposure to covid-19: Secondary | ICD-10-CM

## 2019-03-26 DIAGNOSIS — Z20828 Contact with and (suspected) exposure to other viral communicable diseases: Secondary | ICD-10-CM | POA: Diagnosis not present

## 2019-03-27 LAB — NOVEL CORONAVIRUS, NAA: SARS-CoV-2, NAA: NOT DETECTED

## 2019-03-30 ENCOUNTER — Telehealth: Payer: Self-pay

## 2019-03-30 NOTE — Telephone Encounter (Signed)

## 2019-04-10 DIAGNOSIS — J069 Acute upper respiratory infection, unspecified: Secondary | ICD-10-CM | POA: Diagnosis not present

## 2019-04-10 DIAGNOSIS — Z681 Body mass index (BMI) 19 or less, adult: Secondary | ICD-10-CM | POA: Diagnosis not present

## 2019-04-15 ENCOUNTER — Ambulatory Visit (INDEPENDENT_AMBULATORY_CARE_PROVIDER_SITE_OTHER): Payer: Medicare Other | Admitting: *Deleted

## 2019-04-15 DIAGNOSIS — Z950 Presence of cardiac pacemaker: Secondary | ICD-10-CM | POA: Diagnosis not present

## 2019-04-15 LAB — CUP PACEART REMOTE DEVICE CHECK
Battery Impedance: 1958 Ohm
Battery Remaining Longevity: 36 mo
Battery Voltage: 2.75 V
Brady Statistic AP VP Percent: 82 %
Brady Statistic AP VS Percent: 0 %
Brady Statistic AS VP Percent: 18 %
Brady Statistic AS VS Percent: 0 %
Date Time Interrogation Session: 20210118165326
Implantable Lead Implant Date: 20110818
Implantable Lead Implant Date: 20110818
Implantable Lead Location: 753859
Implantable Lead Location: 753860
Implantable Lead Model: 5092
Implantable Lead Model: 5594
Implantable Pulse Generator Implant Date: 20110818
Lead Channel Impedance Value: 522 Ohm
Lead Channel Impedance Value: 617 Ohm
Lead Channel Pacing Threshold Amplitude: 0.375 V
Lead Channel Pacing Threshold Amplitude: 0.5 V
Lead Channel Pacing Threshold Pulse Width: 0.4 ms
Lead Channel Pacing Threshold Pulse Width: 0.4 ms
Lead Channel Setting Pacing Amplitude: 1.5 V
Lead Channel Setting Pacing Amplitude: 2 V
Lead Channel Setting Pacing Pulse Width: 0.4 ms
Lead Channel Setting Sensing Sensitivity: 2.8 mV

## 2019-04-16 NOTE — Progress Notes (Signed)
PPM remote 

## 2019-04-23 DIAGNOSIS — M79671 Pain in right foot: Secondary | ICD-10-CM | POA: Diagnosis not present

## 2019-04-23 DIAGNOSIS — M1990 Unspecified osteoarthritis, unspecified site: Secondary | ICD-10-CM | POA: Diagnosis not present

## 2019-04-23 DIAGNOSIS — M778 Other enthesopathies, not elsewhere classified: Secondary | ICD-10-CM | POA: Diagnosis not present

## 2019-04-23 DIAGNOSIS — M722 Plantar fascial fibromatosis: Secondary | ICD-10-CM | POA: Diagnosis not present

## 2019-04-29 DIAGNOSIS — J069 Acute upper respiratory infection, unspecified: Secondary | ICD-10-CM | POA: Diagnosis not present

## 2019-04-29 DIAGNOSIS — Z681 Body mass index (BMI) 19 or less, adult: Secondary | ICD-10-CM | POA: Diagnosis not present

## 2019-06-06 ENCOUNTER — Ambulatory Visit: Payer: Medicare Other | Admitting: Internal Medicine

## 2019-06-06 ENCOUNTER — Other Ambulatory Visit: Payer: Self-pay

## 2019-06-06 ENCOUNTER — Encounter: Payer: Self-pay | Admitting: Internal Medicine

## 2019-06-06 VITALS — BP 164/90 | HR 84 | Temp 97.2°F | Ht 63.0 in | Wt 105.0 lb

## 2019-06-06 DIAGNOSIS — I442 Atrioventricular block, complete: Secondary | ICD-10-CM

## 2019-06-06 DIAGNOSIS — Z950 Presence of cardiac pacemaker: Secondary | ICD-10-CM | POA: Diagnosis not present

## 2019-06-06 DIAGNOSIS — I480 Paroxysmal atrial fibrillation: Secondary | ICD-10-CM

## 2019-06-06 LAB — CUP PACEART INCLINIC DEVICE CHECK
Battery Impedance: 2013 Ohm
Battery Remaining Longevity: 35 mo
Battery Voltage: 2.76 V
Brady Statistic AP VP Percent: 82 %
Brady Statistic AP VS Percent: 0 %
Brady Statistic AS VP Percent: 18 %
Brady Statistic AS VS Percent: 0 %
Date Time Interrogation Session: 20210311105850
Implantable Lead Implant Date: 20110818
Implantable Lead Implant Date: 20110818
Implantable Lead Location: 753859
Implantable Lead Location: 753860
Implantable Lead Model: 5092
Implantable Lead Model: 5594
Implantable Pulse Generator Implant Date: 20110818
Lead Channel Impedance Value: 519 Ohm
Lead Channel Impedance Value: 600 Ohm
Lead Channel Pacing Threshold Amplitude: 0.75 V
Lead Channel Pacing Threshold Amplitude: 0.75 V
Lead Channel Pacing Threshold Pulse Width: 0.4 ms
Lead Channel Pacing Threshold Pulse Width: 0.4 ms
Lead Channel Sensing Intrinsic Amplitude: 2 mV
Lead Channel Setting Pacing Amplitude: 1.5 V
Lead Channel Setting Pacing Amplitude: 2 V
Lead Channel Setting Pacing Pulse Width: 0.4 ms
Lead Channel Setting Sensing Sensitivity: 2.8 mV

## 2019-06-06 MED ORDER — DIGOXIN 125 MCG PO TABS: ORAL_TABLET | ORAL | 3 refills | Status: DC

## 2019-06-06 NOTE — Patient Instructions (Signed)
Medication Instructions:  Your physician has recommended you make the following change in your medication: Take Digoxin 0.125 mg Monday -Friday and none on Saturday or Sunday    *If you need a refill on your cardiac medications before your next appointment, please call your pharmacy*   Lab Work: NONE   If you have labs (blood work) drawn today and your tests are completely normal, you will receive your results only by: Marland Kitchen MyChart Message (if you have MyChart) OR . A paper copy in the mail If you have any lab test that is abnormal or we need to change your treatment, we will call you to review the results.   Testing/Procedures: NONE    Follow-Up: At [facility], you and your health needs are our priority.  As part of our continuing mission to provide you with exceptional heart care, we have created designated Provider Care Teams.  These Care Teams include your primary Cardiologist (physician) and Advanced Practice Providers (APPs -  Physician Assistants and Nurse Practitioners) who all work together to provide you with the care you need, when you need it.  We recommend signing up for the patient portal called "MyChart".  Sign up information is provided on this After Visit Summary.  MyChart is used to connect with patients for Virtual Visits (Telemedicine).  Patients are able to view lab/test results, encounter notes, upcoming appointments, etc.  Non-urgent messages can be sent to your provider as well.   To learn more about what you can do with MyChart, go to [email or web address].    Your next appointment:   1 year(s)  The format for your next appointment:   In Person  Provider:   Cristopher Peru, MD   Other Instructions Thank you for choosing [facility]!

## 2019-06-06 NOTE — Progress Notes (Signed)
HPI [name] returns today for followup. She is a pleasand 86 yo woman with a h/o HTN, SSS, chronic systolic heart failure and CAD. In the interim, she has done well though she admits to some weight loss. Her appetite is down a bit. She is on a fairly high dose of digoxin for her weight and age. Allergies  Allergen Reactions  . Macrodantin Shortness Of Breath  . Sulfa Antibiotics Shortness Of Breath     Current Outpatient Medications  Medication Sig Dispense Refill  . ALPRAZolam (XANAX) 0.5 MG tablet Take 0.5 mg by mouth 3 (three) times daily as needed.    Marland Kitchen apixaban (ELIQUIS) 2.5 MG TABS tablet Take 1 tablet (2.5 mg total) by mouth 2 (two) times daily. 180 tablet 3  . Calcium Carbonate-Vitamin D 600-400 MG-UNIT chew tablet Chew 1 tablet by mouth every Saturday.     . digoxin (LANOXIN) 0.125 MG tablet TAKE 1 TABLET BY MOUTH EVERY MORNING 90 tablet 0  . furosemide (LASIX) 20 MG tablet Take 20 mg by mouth 2 (two) times a week.     . levocetirizine (XYZAL) 5 MG tablet Take 5 mg by mouth every morning.     Marland Kitchen levothyroxine (SYNTHROID, LEVOTHROID) 88 MCG tablet Take 88 mcg by mouth daily before breakfast.     . lisinopril (ZESTRIL) 10 MG tablet TK 1 T PO D    . metoprolol succinate (TOPROL-XL) 50 MG 24 hr tablet Take 50 mg by mouth daily.     . Omega-3 Fatty Acids (FISH OIL) 1000 MG CAPS Take 1,000 mg by mouth daily.    Vladimir Faster Glycol-Propyl Glycol (SYSTANE OP) Place 1 drop into both eyes 3 (three) times daily.     . Potassium 99 MG TABS Take 1 tablet by mouth 2 (two) times daily.     . VENTOLIN HFA 108 (90 BASE) MCG/ACT inhaler Inhale 2 puffs into the lungs every 4 (four) hours as needed for wheezing or shortness of breath.     . vitamin C (ASCORBIC ACID) 500 MG tablet Take 500 mg by mouth every morning.      No current facility-administered medications for this visit.     Past Medical History:  Diagnosis Date  . Cardiomyopathy ([place])   . CHF (congestive heart failure) ([place])    . Coronary artery disease   . GERD (gastroesophageal reflux disease)   . History of sick sinus syndrome   . Hypertension   . Hypothyroidism   . Paroxysmal atrial fibrillation (HCC)   . Shortness of breath dyspnea   . Thyroid disease     ROS:   All systems reviewed and negative except as noted in the HPI.   Past Surgical History:  Procedure Laterality Date  . APPENDECTOMY    . CARDIAC CATHETERIZATION  07/25/2005  . CATARACT EXTRACTION W/PHACO Right 08/21/2014   Procedure: CATARACT EXTRACTION PHACO AND INTRAOCULAR LENS PLACEMENT ([facility]);  Surgeon: Tonny Branch, MD;  Location: [facility];  Service: Ophthalmology;  Laterality: Right;  CDE 10.94  . CATARACT EXTRACTION W/PHACO Left 09/08/2014   Procedure: CATARACT EXTRACTION PHACO AND INTRAOCULAR LENS PLACEMENT ([facility]);  Surgeon: Tonny Branch, MD;  Location: [facility];  Service: Ophthalmology;  Laterality: Left;  CDE:7.79  . CHOLECYSTECTOMY    . KNEE ARTHROSCOPY Right   . NM MYOVIEW LTD  11/06/2008  . PACEMAKER PLACEMENT       Family History  Problem Relation Age of Onset  . Heart disease Unknown   . Cancer Unknown   .  Kidney disease Unknown      Social History   Socioeconomic History  . Marital status: Widowed    Spouse name: Not on file  . Number of children: Not on file  . Years of education: Not on file  . Highest education level: Not on file  Occupational History  . Not on file  Tobacco Use  . Smoking status: Former Smoker    Packs/day: 0.25    Years: 15.00    Pack years: 3.75    Types: Cigarettes    Quit date: 08/13/1970    Years since quitting: 48.8  . Smokeless tobacco: Never Used  Substance and Sexual Activity  . Alcohol use: No    Alcohol/week: 0.0 standard drinks  . Drug use: No  . Sexual activity: Never    Birth control/protection: Post-menopausal  Other Topics Concern  . Not on file  Social History Narrative  . Not on file   Social Determinants of Health   Financial Resource Strain:   . Difficulty of  Paying Living Expenses:   Food Insecurity:   . Worried About Charity fundraiser in the Last Year:   . Arboriculturist in the Last Year:   Transportation Needs:   . Film/video editor (Medical):   Marland Kitchen Lack of Transportation (Non-Medical):   Physical Activity:   . Days of Exercise per Week:   . Minutes of Exercise per Session:   Stress:   . Feeling of Stress :   Social Connections:   . Frequency of Communication with Friends and Family:   . Frequency of Social Gatherings with Friends and Family:   . Attends Religious Services:   . Active Member of Clubs or Organizations:   . Attends Archivist Meetings:   Marland Kitchen Marital Status:   Intimate Partner Violence:   . Fear of Current or Ex-Partner:   . Emotionally Abused:   Marland Kitchen Physically Abused:   . Sexually Abused:      BP (!) 164/90   Pulse 84   Temp (!) 97.2 F (36.2 C)   Ht 5\' 3"  (1.6 m)   Wt 105 lb (47.6 kg)   SpO2 96%   BMI 18.60 kg/m   Physical Exam:  Well appearing NAD HEENT: Unremarkable Neck:  No JVD, no thyromegally Lymphatics:  No adenopathy Back:  No CVA tenderness Lungs:  Clear with no wheezes HEART:  Regular rate rhythm, no murmurs, no rubs, no clicks Abd:  soft, positive bowel sounds, no organomegally, no rebound, no guarding Ext:  2 plus pulses, no edema, no cyanosis, no clubbing Skin:  No rashes no nodules Neuro:  CN II through XII intact, motor grossly intact  DEVICE  Normal device function.  See PaceArt for details.   Assess/Plan: 1. CHB - she is asymptomatic, s/p PPM insertion 2. PPM - her medtronic DDD PM has about 3 years of battery longevity.  3. PAF - she had a couple of episodes when she was experiencing bronchitis. She will continue her systemic anti-coag.  4. Weight loss - her appetite is down. I have asked her to reduce her dose of digoxin, taking none on Sat/Sun.  [email or web address] [email or web address].

## 2019-06-06 NOTE — Addendum Note (Signed)
Addended by: Levonne Hubert on: 06/06/2019 11:18 AM   Modules accepted: Orders

## 2019-07-12 DIAGNOSIS — H5203 Hypermetropia, bilateral: Secondary | ICD-10-CM | POA: Diagnosis not present

## 2019-07-15 ENCOUNTER — Ambulatory Visit (INDEPENDENT_AMBULATORY_CARE_PROVIDER_SITE_OTHER): Payer: Medicare Other | Admitting: *Deleted

## 2019-07-15 DIAGNOSIS — Z950 Presence of cardiac pacemaker: Secondary | ICD-10-CM | POA: Diagnosis not present

## 2019-07-15 LAB — CUP PACEART REMOTE DEVICE CHECK
Battery Impedance: 2033 Ohm
Battery Remaining Longevity: 35 mo
Battery Voltage: 2.75 V
Brady Statistic AP VP Percent: 83 %
Brady Statistic AP VS Percent: 0 %
Brady Statistic AS VP Percent: 17 %
Brady Statistic AS VS Percent: 0 %
Date Time Interrogation Session: 20210419105943
Implantable Lead Implant Date: 20110818
Implantable Lead Implant Date: 20110818
Implantable Lead Location: 753859
Implantable Lead Location: 753860
Implantable Lead Model: 5092
Implantable Lead Model: 5594
Implantable Pulse Generator Implant Date: 20110818
Lead Channel Impedance Value: 530 Ohm
Lead Channel Impedance Value: 612 Ohm
Lead Channel Pacing Threshold Amplitude: 0.5 V
Lead Channel Pacing Threshold Amplitude: 0.625 V
Lead Channel Pacing Threshold Pulse Width: 0.4 ms
Lead Channel Pacing Threshold Pulse Width: 0.4 ms
Lead Channel Setting Pacing Amplitude: 1.5 V
Lead Channel Setting Pacing Amplitude: 2 V
Lead Channel Setting Pacing Pulse Width: 0.4 ms
Lead Channel Setting Sensing Sensitivity: 2.8 mV

## 2019-07-16 NOTE — Progress Notes (Signed)
PPM Remote  

## 2019-07-26 DIAGNOSIS — I10 Essential (primary) hypertension: Secondary | ICD-10-CM | POA: Diagnosis not present

## 2019-07-26 DIAGNOSIS — E7849 Other hyperlipidemia: Secondary | ICD-10-CM | POA: Diagnosis not present

## 2019-08-12 DIAGNOSIS — Z681 Body mass index (BMI) 19 or less, adult: Secondary | ICD-10-CM | POA: Diagnosis not present

## 2019-08-12 DIAGNOSIS — J069 Acute upper respiratory infection, unspecified: Secondary | ICD-10-CM | POA: Diagnosis not present

## 2019-08-12 DIAGNOSIS — J449 Chronic obstructive pulmonary disease, unspecified: Secondary | ICD-10-CM | POA: Diagnosis not present

## 2019-08-26 DIAGNOSIS — J449 Chronic obstructive pulmonary disease, unspecified: Secondary | ICD-10-CM | POA: Diagnosis not present

## 2019-08-26 DIAGNOSIS — I130 Hypertensive heart and chronic kidney disease with heart failure and stage 1 through stage 4 chronic kidney disease, or unspecified chronic kidney disease: Secondary | ICD-10-CM | POA: Diagnosis not present

## 2019-08-26 DIAGNOSIS — E039 Hypothyroidism, unspecified: Secondary | ICD-10-CM | POA: Diagnosis not present

## 2019-08-26 DIAGNOSIS — E1122 Type 2 diabetes mellitus with diabetic chronic kidney disease: Secondary | ICD-10-CM | POA: Diagnosis not present

## 2019-09-13 ENCOUNTER — Other Ambulatory Visit: Payer: Self-pay | Admitting: Internal Medicine

## 2019-09-18 ENCOUNTER — Telehealth: Payer: Self-pay

## 2019-09-18 NOTE — Telephone Encounter (Signed)
Confirmed digoxin prescription with Pt's pharmacy

## 2019-09-25 DIAGNOSIS — I5022 Chronic systolic (congestive) heart failure: Secondary | ICD-10-CM | POA: Diagnosis not present

## 2019-09-25 DIAGNOSIS — I130 Hypertensive heart and chronic kidney disease with heart failure and stage 1 through stage 4 chronic kidney disease, or unspecified chronic kidney disease: Secondary | ICD-10-CM | POA: Diagnosis not present

## 2019-09-25 DIAGNOSIS — J449 Chronic obstructive pulmonary disease, unspecified: Secondary | ICD-10-CM | POA: Diagnosis not present

## 2019-09-25 DIAGNOSIS — E039 Hypothyroidism, unspecified: Secondary | ICD-10-CM | POA: Diagnosis not present

## 2019-10-16 DIAGNOSIS — L820 Inflamed seborrheic keratosis: Secondary | ICD-10-CM | POA: Diagnosis not present

## 2019-10-28 ENCOUNTER — Telehealth: Payer: Self-pay

## 2019-10-28 NOTE — Telephone Encounter (Signed)
Pt states she tried to send the transmission but it did not complete like it normally do. She is going to try again. I gave her the number to tech support for additional help.

## 2019-10-30 ENCOUNTER — Ambulatory Visit (INDEPENDENT_AMBULATORY_CARE_PROVIDER_SITE_OTHER): Payer: Medicare Other | Admitting: *Deleted

## 2019-10-30 DIAGNOSIS — I442 Atrioventricular block, complete: Secondary | ICD-10-CM | POA: Diagnosis not present

## 2019-10-30 LAB — CUP PACEART REMOTE DEVICE CHECK
Battery Impedance: 2177 Ohm
Battery Remaining Longevity: 33 mo
Battery Voltage: 2.76 V
Brady Statistic AP VP Percent: 85 %
Brady Statistic AP VS Percent: 0 %
Brady Statistic AS VP Percent: 15 %
Brady Statistic AS VS Percent: 0 %
Date Time Interrogation Session: 20210803125949
Implantable Lead Implant Date: 20110818
Implantable Lead Implant Date: 20110818
Implantable Lead Location: 753859
Implantable Lead Location: 753860
Implantable Lead Model: 5092
Implantable Lead Model: 5594
Implantable Pulse Generator Implant Date: 20110818
Lead Channel Impedance Value: 549 Ohm
Lead Channel Impedance Value: 616 Ohm
Lead Channel Pacing Threshold Amplitude: 0.5 V
Lead Channel Pacing Threshold Amplitude: 0.625 V
Lead Channel Pacing Threshold Pulse Width: 0.4 ms
Lead Channel Pacing Threshold Pulse Width: 0.4 ms
Lead Channel Setting Pacing Amplitude: 1.5 V
Lead Channel Setting Pacing Amplitude: 2 V
Lead Channel Setting Pacing Pulse Width: 0.4 ms
Lead Channel Setting Sensing Sensitivity: 2 mV

## 2019-10-31 NOTE — Progress Notes (Signed)
Remote pacemaker transmission.   

## 2019-11-12 DIAGNOSIS — Z1389 Encounter for screening for other disorder: Secondary | ICD-10-CM | POA: Diagnosis not present

## 2019-11-12 DIAGNOSIS — R7309 Other abnormal glucose: Secondary | ICD-10-CM | POA: Diagnosis not present

## 2019-11-12 DIAGNOSIS — Z6820 Body mass index (BMI) 20.0-20.9, adult: Secondary | ICD-10-CM | POA: Diagnosis not present

## 2019-11-12 DIAGNOSIS — I10 Essential (primary) hypertension: Secondary | ICD-10-CM | POA: Diagnosis not present

## 2019-11-12 DIAGNOSIS — E785 Hyperlipidemia, unspecified: Secondary | ICD-10-CM | POA: Diagnosis not present

## 2019-11-12 DIAGNOSIS — Z0001 Encounter for general adult medical examination with abnormal findings: Secondary | ICD-10-CM | POA: Diagnosis not present

## 2019-11-12 DIAGNOSIS — E039 Hypothyroidism, unspecified: Secondary | ICD-10-CM | POA: Diagnosis not present

## 2019-11-26 DIAGNOSIS — L820 Inflamed seborrheic keratosis: Secondary | ICD-10-CM | POA: Diagnosis not present

## 2019-12-26 DIAGNOSIS — I10 Essential (primary) hypertension: Secondary | ICD-10-CM | POA: Diagnosis not present

## 2019-12-26 DIAGNOSIS — M1991 Primary osteoarthritis, unspecified site: Secondary | ICD-10-CM | POA: Diagnosis not present

## 2019-12-26 DIAGNOSIS — J449 Chronic obstructive pulmonary disease, unspecified: Secondary | ICD-10-CM | POA: Diagnosis not present

## 2019-12-26 DIAGNOSIS — E7849 Other hyperlipidemia: Secondary | ICD-10-CM | POA: Diagnosis not present

## 2020-01-01 DIAGNOSIS — Z23 Encounter for immunization: Secondary | ICD-10-CM | POA: Diagnosis not present

## 2020-01-01 DIAGNOSIS — M1991 Primary osteoarthritis, unspecified site: Secondary | ICD-10-CM | POA: Diagnosis not present

## 2020-01-01 DIAGNOSIS — Z681 Body mass index (BMI) 19 or less, adult: Secondary | ICD-10-CM | POA: Diagnosis not present

## 2020-01-25 DIAGNOSIS — J449 Chronic obstructive pulmonary disease, unspecified: Secondary | ICD-10-CM | POA: Diagnosis not present

## 2020-01-25 DIAGNOSIS — M1991 Primary osteoarthritis, unspecified site: Secondary | ICD-10-CM | POA: Diagnosis not present

## 2020-01-25 DIAGNOSIS — E7849 Other hyperlipidemia: Secondary | ICD-10-CM | POA: Diagnosis not present

## 2020-01-25 DIAGNOSIS — I10 Essential (primary) hypertension: Secondary | ICD-10-CM | POA: Diagnosis not present

## 2020-01-29 ENCOUNTER — Ambulatory Visit (INDEPENDENT_AMBULATORY_CARE_PROVIDER_SITE_OTHER): Payer: Medicare Other

## 2020-01-29 DIAGNOSIS — I442 Atrioventricular block, complete: Secondary | ICD-10-CM

## 2020-01-29 LAB — CUP PACEART REMOTE DEVICE CHECK
Battery Impedance: 2151 Ohm
Battery Remaining Longevity: 33 mo
Battery Voltage: 2.74 V
Brady Statistic AP VP Percent: 86 %
Brady Statistic AP VS Percent: 0 %
Brady Statistic AS VP Percent: 14 %
Brady Statistic AS VS Percent: 0 %
Date Time Interrogation Session: 20211103131341
Implantable Lead Implant Date: 20110818
Implantable Lead Implant Date: 20110818
Implantable Lead Location: 753859
Implantable Lead Location: 753860
Implantable Lead Model: 5092
Implantable Lead Model: 5594
Implantable Pulse Generator Implant Date: 20110818
Lead Channel Impedance Value: 561 Ohm
Lead Channel Impedance Value: 626 Ohm
Lead Channel Pacing Threshold Amplitude: 0.5 V
Lead Channel Pacing Threshold Amplitude: 0.625 V
Lead Channel Pacing Threshold Pulse Width: 0.4 ms
Lead Channel Pacing Threshold Pulse Width: 0.4 ms
Lead Channel Setting Pacing Amplitude: 1.5 V
Lead Channel Setting Pacing Amplitude: 2 V
Lead Channel Setting Pacing Pulse Width: 0.4 ms
Lead Channel Setting Sensing Sensitivity: 2 mV

## 2020-01-30 NOTE — Progress Notes (Signed)
Remote pacemaker transmission.   

## 2020-02-17 DIAGNOSIS — C44629 Squamous cell carcinoma of skin of left upper limb, including shoulder: Secondary | ICD-10-CM | POA: Diagnosis not present

## 2020-02-17 DIAGNOSIS — L918 Other hypertrophic disorders of the skin: Secondary | ICD-10-CM | POA: Diagnosis not present

## 2020-02-25 DIAGNOSIS — M1991 Primary osteoarthritis, unspecified site: Secondary | ICD-10-CM | POA: Diagnosis not present

## 2020-02-25 DIAGNOSIS — I10 Essential (primary) hypertension: Secondary | ICD-10-CM | POA: Diagnosis not present

## 2020-02-25 DIAGNOSIS — E7849 Other hyperlipidemia: Secondary | ICD-10-CM | POA: Diagnosis not present

## 2020-02-25 DIAGNOSIS — J449 Chronic obstructive pulmonary disease, unspecified: Secondary | ICD-10-CM | POA: Diagnosis not present

## 2020-03-23 ENCOUNTER — Telehealth: Payer: Self-pay | Admitting: Internal Medicine

## 2020-03-23 NOTE — Telephone Encounter (Signed)
Patient stated Dr.Taylor asked if she had bleeding and I explained that he was concerned if she had blood in her stools or urine.I  also told her we understand that our skin is more fragile as we age and it is not uncommon to have scrapes.

## 2020-03-23 NOTE — Telephone Encounter (Signed)
New message    Dr Lovena Le asked her if she has been having a lot of bleeding from cuts and she has been, she had a spot removed from her arm and the scab came off while she was showering then she couldn't get it to stop bleeding, her neighbor (a Marine scientist)  had to come over to bandage to get it to stop bleeding.

## 2020-03-27 DIAGNOSIS — J449 Chronic obstructive pulmonary disease, unspecified: Secondary | ICD-10-CM | POA: Diagnosis not present

## 2020-03-27 DIAGNOSIS — E782 Mixed hyperlipidemia: Secondary | ICD-10-CM | POA: Diagnosis not present

## 2020-03-27 DIAGNOSIS — M1991 Primary osteoarthritis, unspecified site: Secondary | ICD-10-CM | POA: Diagnosis not present

## 2020-03-27 DIAGNOSIS — I10 Essential (primary) hypertension: Secondary | ICD-10-CM | POA: Diagnosis not present

## 2020-03-30 DIAGNOSIS — Z85828 Personal history of other malignant neoplasm of skin: Secondary | ICD-10-CM | POA: Diagnosis not present

## 2020-03-30 DIAGNOSIS — L2089 Other atopic dermatitis: Secondary | ICD-10-CM | POA: Diagnosis not present

## 2020-03-30 DIAGNOSIS — Z08 Encounter for follow-up examination after completed treatment for malignant neoplasm: Secondary | ICD-10-CM | POA: Diagnosis not present

## 2020-04-20 DIAGNOSIS — J069 Acute upper respiratory infection, unspecified: Secondary | ICD-10-CM | POA: Diagnosis not present

## 2020-04-25 DIAGNOSIS — K219 Gastro-esophageal reflux disease without esophagitis: Secondary | ICD-10-CM | POA: Diagnosis not present

## 2020-04-25 DIAGNOSIS — J449 Chronic obstructive pulmonary disease, unspecified: Secondary | ICD-10-CM | POA: Diagnosis not present

## 2020-04-25 DIAGNOSIS — I10 Essential (primary) hypertension: Secondary | ICD-10-CM | POA: Diagnosis not present

## 2020-04-25 DIAGNOSIS — E7849 Other hyperlipidemia: Secondary | ICD-10-CM | POA: Diagnosis not present

## 2020-04-29 ENCOUNTER — Ambulatory Visit (INDEPENDENT_AMBULATORY_CARE_PROVIDER_SITE_OTHER): Payer: Medicare Other

## 2020-04-29 DIAGNOSIS — I442 Atrioventricular block, complete: Secondary | ICD-10-CM | POA: Diagnosis not present

## 2020-04-30 LAB — CUP PACEART REMOTE DEVICE CHECK
Battery Impedance: 2374 Ohm
Battery Remaining Longevity: 30 mo
Battery Voltage: 2.74 V
Brady Statistic AP VP Percent: 84 %
Brady Statistic AP VS Percent: 0 %
Brady Statistic AS VP Percent: 15 %
Brady Statistic AS VS Percent: 0 %
Date Time Interrogation Session: 20220203121140
Implantable Lead Implant Date: 20110818
Implantable Lead Implant Date: 20110818
Implantable Lead Location: 753859
Implantable Lead Location: 753860
Implantable Lead Model: 5092
Implantable Lead Model: 5594
Implantable Pulse Generator Implant Date: 20110818
Lead Channel Impedance Value: 535 Ohm
Lead Channel Impedance Value: 584 Ohm
Lead Channel Pacing Threshold Amplitude: 0.5 V
Lead Channel Pacing Threshold Amplitude: 0.625 V
Lead Channel Pacing Threshold Pulse Width: 0.4 ms
Lead Channel Pacing Threshold Pulse Width: 0.4 ms
Lead Channel Setting Pacing Amplitude: 1.5 V
Lead Channel Setting Pacing Amplitude: 2 V
Lead Channel Setting Pacing Pulse Width: 0.4 ms
Lead Channel Setting Sensing Sensitivity: 2 mV

## 2020-05-04 NOTE — Progress Notes (Signed)
Remote pacemaker transmission.   

## 2020-05-26 DIAGNOSIS — Z681 Body mass index (BMI) 19 or less, adult: Secondary | ICD-10-CM | POA: Diagnosis not present

## 2020-05-26 DIAGNOSIS — J22 Unspecified acute lower respiratory infection: Secondary | ICD-10-CM | POA: Diagnosis not present

## 2020-05-26 DIAGNOSIS — J449 Chronic obstructive pulmonary disease, unspecified: Secondary | ICD-10-CM | POA: Diagnosis not present

## 2020-06-09 ENCOUNTER — Other Ambulatory Visit: Payer: Self-pay

## 2020-06-09 MED ORDER — DIGOXIN 125 MCG PO TABS: ORAL_TABLET | ORAL | 3 refills | Status: DC

## 2020-06-09 NOTE — Telephone Encounter (Signed)
This is a [place] pt.  °

## 2020-06-11 ENCOUNTER — Encounter: Payer: Medicare Other | Admitting: Internal Medicine

## 2020-06-11 ENCOUNTER — Encounter: Payer: Self-pay | Admitting: Internal Medicine

## 2020-06-11 ENCOUNTER — Other Ambulatory Visit: Payer: Self-pay

## 2020-06-11 ENCOUNTER — Ambulatory Visit (INDEPENDENT_AMBULATORY_CARE_PROVIDER_SITE_OTHER): Payer: Medicare Other | Admitting: Internal Medicine

## 2020-06-11 VITALS — BP 138/66 | HR 90 | Ht 63.0 in | Wt 101.4 lb

## 2020-06-11 DIAGNOSIS — I480 Paroxysmal atrial fibrillation: Secondary | ICD-10-CM | POA: Diagnosis not present

## 2020-06-11 DIAGNOSIS — I442 Atrioventricular block, complete: Secondary | ICD-10-CM | POA: Diagnosis not present

## 2020-06-11 LAB — CUP PACEART INCLINIC DEVICE CHECK
Date Time Interrogation Session: 20220317145030
Implantable Lead Implant Date: 20110818
Implantable Lead Implant Date: 20110818
Implantable Lead Location: 753859
Implantable Lead Location: 753860
Implantable Lead Model: 5092
Implantable Lead Model: 5594
Implantable Pulse Generator Implant Date: 20110818
Lead Channel Pacing Threshold Amplitude: 0.75 V
Lead Channel Pacing Threshold Amplitude: 0.75 V
Lead Channel Pacing Threshold Pulse Width: 0.4 ms
Lead Channel Pacing Threshold Pulse Width: 0.4 ms
Lead Channel Sensing Intrinsic Amplitude: 2 mV

## 2020-06-11 NOTE — Progress Notes (Signed)
HPI [name] returns today for followup. She is a pleasand 87 yo woman with a h/o HTN, SSS, chronic systolic heart failure and CAD. In the interim, she has done well though she admits to some weight loss. Her appetite is down a bit. She is on a fairly high dose of digoxin for her weight and age and I reduced the dose to 0.125 mg Mon-Fri, none on Sat/Sun.  Allergies  Allergen Reactions  . Macrodantin Shortness Of Breath  . Sulfa Antibiotics Shortness Of Breath     Current Outpatient Medications  Medication Sig Dispense Refill  . ALPRAZolam (XANAX) 0.5 MG tablet Take 0.5 mg by mouth 3 (three) times daily as needed.    . Calcium Carbonate-Vitamin D 600-400 MG-UNIT chew tablet Chew 1 tablet by mouth every Saturday.     . digoxin (LANOXIN) 0.125 MG tablet Take one tablet on Monday - Friday and None on Saturday or Sunday 90 tablet 3  . ELIQUIS 2.5 MG TABS tablet TAKE 1 TABLET BY MOUTH TWICE DAILY 180 tablet 3  . furosemide (LASIX) 20 MG tablet Take 20 mg by mouth 2 (two) times a week.     . levocetirizine (XYZAL) 5 MG tablet Take 5 mg by mouth every morning.     Marland Kitchen levothyroxine (SYNTHROID, LEVOTHROID) 88 MCG tablet Take 88 mcg by mouth daily before breakfast.     . lisinopril (ZESTRIL) 10 MG tablet TK 1 T PO D    . metoprolol succinate (TOPROL-XL) 50 MG 24 hr tablet Take 50 mg by mouth daily.     . Omega-3 Fatty Acids (FISH OIL) 1000 MG CAPS Take 1,000 mg by mouth daily.    Vladimir Faster Glycol-Propyl Glycol (SYSTANE OP) Place 1 drop into both eyes 3 (three) times daily.     . Potassium 99 MG TABS Take 1 tablet by mouth 2 (two) times daily.     . VENTOLIN HFA 108 (90 BASE) MCG/ACT inhaler Inhale 2 puffs into the lungs every 4 (four) hours as needed for wheezing or shortness of breath.     . vitamin C (ASCORBIC ACID) 500 MG tablet Take 500 mg by mouth every morning.      No current facility-administered medications for this visit.     Past Medical History:  Diagnosis Date  .  Cardiomyopathy ([place])   . CHF (congestive heart failure) ([place])   . Coronary artery disease   . GERD (gastroesophageal reflux disease)   . History of sick sinus syndrome   . Hypertension   . Hypothyroidism   . Paroxysmal atrial fibrillation (HCC)   . Shortness of breath dyspnea   . Thyroid disease     ROS:   All systems reviewed and negative except as noted in the HPI.   Past Surgical History:  Procedure Laterality Date  . APPENDECTOMY    . CARDIAC CATHETERIZATION  07/25/2005  . CATARACT EXTRACTION W/PHACO Right 08/21/2014   Procedure: CATARACT EXTRACTION PHACO AND INTRAOCULAR LENS PLACEMENT ([facility]);  Surgeon: Tonny Branch, MD;  Location: [facility];  Service: Ophthalmology;  Laterality: Right;  CDE 10.94  . CATARACT EXTRACTION W/PHACO Left 09/08/2014   Procedure: CATARACT EXTRACTION PHACO AND INTRAOCULAR LENS PLACEMENT ([facility]);  Surgeon: Tonny Branch, MD;  Location: [facility];  Service: Ophthalmology;  Laterality: Left;  CDE:7.79  . CHOLECYSTECTOMY    . KNEE ARTHROSCOPY Right   . NM MYOVIEW LTD  11/06/2008  . PACEMAKER PLACEMENT       Family History  Problem Relation  Age of Onset  . Heart disease Other   . Cancer Other   . Kidney disease Other      Social History   Socioeconomic History  . Marital status: Widowed    Spouse name: Not on file  . Number of children: Not on file  . Years of education: Not on file  . Highest education level: Not on file  Occupational History  . Not on file  Tobacco Use  . Smoking status: Former Smoker    Packs/day: 0.25    Years: 15.00    Pack years: 3.75    Types: Cigarettes    Quit date: 08/13/1970    Years since quitting: 49.8  . Smokeless tobacco: Never Used  Vaping Use  . Vaping Use: Never used  Substance and Sexual Activity  . Alcohol use: No    Alcohol/week: 0.0 standard drinks  . Drug use: No  . Sexual activity: Never    Birth control/protection: Post-menopausal  Other Topics Concern  . Not on file  Social History Narrative  . Not  on file   Social Determinants of Health   Financial Resource Strain: Not on file  Food Insecurity: Not on file  Transportation Needs: Not on file  Physical Activity: Not on file  Stress: Not on file  Social Connections: Not on file  Intimate Partner Violence: Not on file     BP 138/66   Pulse 90   Ht 5\' 3"  (1.6 m)   Wt 101 lb 6.4 oz (46 kg)   SpO2 94%   BMI 17.96 kg/m   Physical Exam:  Well appearing NAD HEENT: Unremarkable Neck:  No JVD, no thyromegally Lymphatics:  No adenopathy Back:  No CVA tenderness Lungs:  Clear with no wheezes HEART:  Regular rate rhythm, no murmurs, no rubs, no clicks Abd:  soft, positive bowel sounds, no organomegally, no rebound, no guarding Ext:  2 plus pulses, no edema, no cyanosis, no clubbing Skin:  No rashes no nodules Neuro:  CN II through XII intact, motor grossly intact  ECG - NSR with AV pacing  DEVICE  Normal device function.  See PaceArt for details.   Assess/Plan: 1. CHB - she is asymptomatic, s/p PPM insertion 2. PPM - her medtronic DDD PM has about 2.5 years of battery longevity.  3. PAF - she had a couple of episodes when she was experiencing bronchitis. She will continue her systemic anti-coag.  4. Weight loss - her weight appears to have stabilized. Her appetite is better after reducing her digoxin.  Carleene Overlie Taylor,MD

## 2020-06-11 NOTE — Patient Instructions (Signed)
Medication Instructions:  Your physician recommends that you continue on your current medications as directed. Please refer to the Current Medication list given to you today.  *If you need a refill on your cardiac medications before your next appointment, please call your pharmacy*   Lab Work: NONE   If you have labs (blood work) drawn today and your tests are completely normal, you will receive your results only by: . MyChart Message (if you have MyChart) OR . A paper copy in the mail If you have any lab test that is abnormal or we need to change your treatment, we will call you to review the results.   Testing/Procedures: NONE    Follow-Up: At [facility], you and your health needs are our priority.  As part of our continuing mission to provide you with exceptional heart care, we have created designated Provider Care Teams.  These Care Teams include your primary Cardiologist (physician) and Advanced Practice Providers (APPs -  Physician Assistants and Nurse Practitioners) who all work together to provide you with the care you need, when you need it.  We recommend signing up for the patient portal called "MyChart".  Sign up information is provided on this After Visit Summary.  MyChart is used to connect with patients for Virtual Visits (Telemedicine).  Patients are able to view lab/test results, encounter notes, upcoming appointments, etc.  Non-urgent messages can be sent to your provider as well.   To learn more about what you can do with MyChart, go to [email or web address].    Your next appointment:   1 month(s)  The format for your next appointment:   In Person  Provider:   Gregg Taylor, MD   Other Instructions Thank you for choosing [facility]!    

## 2020-06-24 DIAGNOSIS — J449 Chronic obstructive pulmonary disease, unspecified: Secondary | ICD-10-CM | POA: Diagnosis not present

## 2020-06-24 DIAGNOSIS — I10 Essential (primary) hypertension: Secondary | ICD-10-CM | POA: Diagnosis not present

## 2020-06-24 DIAGNOSIS — K219 Gastro-esophageal reflux disease without esophagitis: Secondary | ICD-10-CM | POA: Diagnosis not present

## 2020-06-24 DIAGNOSIS — E7849 Other hyperlipidemia: Secondary | ICD-10-CM | POA: Diagnosis not present

## 2020-07-30 ENCOUNTER — Telehealth: Payer: Self-pay

## 2020-07-30 NOTE — Telephone Encounter (Signed)
The patient is going to try to send the transmission again. If she has any issues she will call tech support.

## 2020-08-05 ENCOUNTER — Ambulatory Visit (INDEPENDENT_AMBULATORY_CARE_PROVIDER_SITE_OTHER): Payer: Medicare Other

## 2020-08-05 DIAGNOSIS — I442 Atrioventricular block, complete: Secondary | ICD-10-CM

## 2020-08-05 DIAGNOSIS — I480 Paroxysmal atrial fibrillation: Secondary | ICD-10-CM | POA: Diagnosis not present

## 2020-08-06 LAB — CUP PACEART REMOTE DEVICE CHECK
Battery Impedance: 2678 Ohm
Battery Remaining Longevity: 26 mo
Battery Voltage: 2.74 V
Brady Statistic AP VP Percent: 88 %
Brady Statistic AP VS Percent: 0 %
Brady Statistic AS VP Percent: 12 %
Brady Statistic AS VS Percent: 0 %
Date Time Interrogation Session: 20220511095145
Implantable Lead Implant Date: 20110818
Implantable Lead Implant Date: 20110818
Implantable Lead Location: 753859
Implantable Lead Location: 753860
Implantable Lead Model: 5092
Implantable Lead Model: 5594
Implantable Pulse Generator Implant Date: 20110818
Lead Channel Impedance Value: 540 Ohm
Lead Channel Impedance Value: 580 Ohm
Lead Channel Pacing Threshold Amplitude: 0.625 V
Lead Channel Pacing Threshold Amplitude: 0.75 V
Lead Channel Pacing Threshold Pulse Width: 0.4 ms
Lead Channel Pacing Threshold Pulse Width: 0.4 ms
Lead Channel Setting Pacing Amplitude: 1.5 V
Lead Channel Setting Pacing Amplitude: 2 V
Lead Channel Setting Pacing Pulse Width: 0.4 ms
Lead Channel Setting Sensing Sensitivity: 2 mV

## 2020-08-19 DIAGNOSIS — M2011 Hallux valgus (acquired), right foot: Secondary | ICD-10-CM | POA: Diagnosis not present

## 2020-08-19 DIAGNOSIS — J449 Chronic obstructive pulmonary disease, unspecified: Secondary | ICD-10-CM | POA: Diagnosis not present

## 2020-08-19 DIAGNOSIS — M722 Plantar fascial fibromatosis: Secondary | ICD-10-CM | POA: Diagnosis not present

## 2020-08-19 DIAGNOSIS — Z681 Body mass index (BMI) 19 or less, adult: Secondary | ICD-10-CM | POA: Diagnosis not present

## 2020-08-25 DIAGNOSIS — E782 Mixed hyperlipidemia: Secondary | ICD-10-CM | POA: Diagnosis not present

## 2020-08-25 DIAGNOSIS — J449 Chronic obstructive pulmonary disease, unspecified: Secondary | ICD-10-CM | POA: Diagnosis not present

## 2020-08-25 DIAGNOSIS — I10 Essential (primary) hypertension: Secondary | ICD-10-CM | POA: Diagnosis not present

## 2020-08-25 DIAGNOSIS — K219 Gastro-esophageal reflux disease without esophagitis: Secondary | ICD-10-CM | POA: Diagnosis not present

## 2020-08-26 DIAGNOSIS — J45909 Unspecified asthma, uncomplicated: Secondary | ICD-10-CM | POA: Diagnosis not present

## 2020-08-26 DIAGNOSIS — Z681 Body mass index (BMI) 19 or less, adult: Secondary | ICD-10-CM | POA: Diagnosis not present

## 2020-08-26 DIAGNOSIS — J069 Acute upper respiratory infection, unspecified: Secondary | ICD-10-CM | POA: Diagnosis not present

## 2020-08-26 DIAGNOSIS — J984 Other disorders of lung: Secondary | ICD-10-CM | POA: Diagnosis not present

## 2020-08-27 ENCOUNTER — Ambulatory Visit (HOSPITAL_COMMUNITY)
Admission: RE | Admit: 2020-08-27 | Discharge: 2020-08-27 | Disposition: A | Discharge status: Home / Self Care | Payer: Medicare Other | Source: Ambulatory Visit | Attending: Family Medicine | Admitting: Family Medicine

## 2020-08-27 ENCOUNTER — Other Ambulatory Visit (HOSPITAL_COMMUNITY): Payer: Self-pay | Admitting: Family Medicine

## 2020-08-27 ENCOUNTER — Other Ambulatory Visit: Payer: Self-pay

## 2020-08-27 DIAGNOSIS — R0602 Shortness of breath: Secondary | ICD-10-CM | POA: Diagnosis not present

## 2020-08-27 DIAGNOSIS — J45909 Unspecified asthma, uncomplicated: Secondary | ICD-10-CM

## 2020-08-27 NOTE — Progress Notes (Signed)
Remote pacemaker transmission.   

## 2020-09-21 DIAGNOSIS — M79671 Pain in right foot: Secondary | ICD-10-CM | POA: Diagnosis not present

## 2020-09-21 DIAGNOSIS — M722 Plantar fascial fibromatosis: Secondary | ICD-10-CM | POA: Diagnosis not present

## 2020-10-16 ENCOUNTER — Other Ambulatory Visit: Payer: Self-pay

## 2020-10-16 MED ORDER — APIXABAN 2.5 MG PO TABS: 2.5000 mg | ORAL_TABLET | Freq: Two times a day (BID) | ORAL | 1 refills | Status: DC

## 2020-10-16 NOTE — Telephone Encounter (Signed)
Pt last saw Dr Lovena Le 06/11/20, last labs 11/12/19 Creat 1.05, age 87, weight 46kg, based on specified criteria pt is on appropriate dosage of Eliquis 2.'5mg'$  BID.  Will refill rx.

## 2020-10-25 DIAGNOSIS — E782 Mixed hyperlipidemia: Secondary | ICD-10-CM | POA: Diagnosis not present

## 2020-10-25 DIAGNOSIS — K219 Gastro-esophageal reflux disease without esophagitis: Secondary | ICD-10-CM | POA: Diagnosis not present

## 2020-10-25 DIAGNOSIS — J449 Chronic obstructive pulmonary disease, unspecified: Secondary | ICD-10-CM | POA: Diagnosis not present

## 2020-10-25 DIAGNOSIS — I10 Essential (primary) hypertension: Secondary | ICD-10-CM | POA: Diagnosis not present

## 2020-11-04 ENCOUNTER — Ambulatory Visit (INDEPENDENT_AMBULATORY_CARE_PROVIDER_SITE_OTHER): Payer: Medicare Other

## 2020-11-04 DIAGNOSIS — I442 Atrioventricular block, complete: Secondary | ICD-10-CM

## 2020-11-05 LAB — CUP PACEART REMOTE DEVICE CHECK
Battery Impedance: 2745 Ohm
Battery Remaining Longevity: 26 mo
Battery Voltage: 2.73 V
Brady Statistic AP VP Percent: 86 %
Brady Statistic AP VS Percent: 0 %
Brady Statistic AS VP Percent: 14 %
Brady Statistic AS VS Percent: 0 %
Date Time Interrogation Session: 20220810125125
Implantable Lead Implant Date: 20110818
Implantable Lead Implant Date: 20110818
Implantable Lead Location: 753859
Implantable Lead Location: 753860
Implantable Lead Model: 5092
Implantable Lead Model: 5594
Implantable Pulse Generator Implant Date: 20110818
Lead Channel Impedance Value: 539 Ohm
Lead Channel Impedance Value: 578 Ohm
Lead Channel Pacing Threshold Amplitude: 0.625 V
Lead Channel Pacing Threshold Amplitude: 0.75 V
Lead Channel Pacing Threshold Pulse Width: 0.4 ms
Lead Channel Pacing Threshold Pulse Width: 0.4 ms
Lead Channel Setting Pacing Amplitude: 1.5 V
Lead Channel Setting Pacing Amplitude: 2 V
Lead Channel Setting Pacing Pulse Width: 0.4 ms
Lead Channel Setting Sensing Sensitivity: 2 mV

## 2020-11-26 NOTE — Progress Notes (Signed)
Remote pacemaker transmission.   

## 2020-11-27 DIAGNOSIS — Z681 Body mass index (BMI) 19 or less, adult: Secondary | ICD-10-CM | POA: Diagnosis not present

## 2020-11-27 DIAGNOSIS — Z1389 Encounter for screening for other disorder: Secondary | ICD-10-CM | POA: Diagnosis not present

## 2020-11-27 DIAGNOSIS — Z0001 Encounter for general adult medical examination with abnormal findings: Secondary | ICD-10-CM | POA: Diagnosis not present

## 2020-11-27 DIAGNOSIS — Z23 Encounter for immunization: Secondary | ICD-10-CM | POA: Diagnosis not present

## 2020-11-27 DIAGNOSIS — E039 Hypothyroidism, unspecified: Secondary | ICD-10-CM | POA: Diagnosis not present

## 2020-11-27 DIAGNOSIS — R7309 Other abnormal glucose: Secondary | ICD-10-CM | POA: Diagnosis not present

## 2020-11-27 DIAGNOSIS — I509 Heart failure, unspecified: Secondary | ICD-10-CM | POA: Diagnosis not present

## 2020-12-16 ENCOUNTER — Telehealth: Payer: Self-pay | Admitting: Internal Medicine

## 2020-12-16 DIAGNOSIS — Z85828 Personal history of other malignant neoplasm of skin: Secondary | ICD-10-CM | POA: Diagnosis not present

## 2020-12-16 DIAGNOSIS — L821 Other seborrheic keratosis: Secondary | ICD-10-CM | POA: Diagnosis not present

## 2020-12-16 DIAGNOSIS — Z08 Encounter for follow-up examination after completed treatment for malignant neoplasm: Secondary | ICD-10-CM | POA: Diagnosis not present

## 2020-12-16 DIAGNOSIS — L820 Inflamed seborrheic keratosis: Secondary | ICD-10-CM | POA: Diagnosis not present

## 2020-12-16 NOTE — Telephone Encounter (Signed)
Successful telephone encounter to patient to discuss August remote transmission findings. Reinforced to patient that "no news is good news". Transmission WNL for patient. Patient also enquiring when her next scheduled transmission is due. Provided patient with date, 02/03/21. Patient appreciative of call and information. No other patient questions or concerns noted.   "Scheduled remote reviewed. Normal device function.   There were nine atrial arrhythmias detected that all lasted less than one minute Next remote 91 days."

## 2020-12-16 NOTE — Telephone Encounter (Signed)
Patient was calling to say that she didn't received her heart monitor results from Aug. She was calling in to get that information. Please advise

## 2020-12-28 DIAGNOSIS — R944 Abnormal results of kidney function studies: Secondary | ICD-10-CM | POA: Diagnosis not present

## 2021-01-28 DIAGNOSIS — J449 Chronic obstructive pulmonary disease, unspecified: Secondary | ICD-10-CM | POA: Diagnosis not present

## 2021-01-28 DIAGNOSIS — I5022 Chronic systolic (congestive) heart failure: Secondary | ICD-10-CM | POA: Diagnosis not present

## 2021-01-28 DIAGNOSIS — Z681 Body mass index (BMI) 19 or less, adult: Secondary | ICD-10-CM | POA: Diagnosis not present

## 2021-02-02 LAB — CUP PACEART REMOTE DEVICE CHECK
Battery Impedance: 2970 Ohm
Battery Remaining Longevity: 24 mo
Battery Voltage: 2.74 V
Brady Statistic AP VP Percent: 85 %
Brady Statistic AP VS Percent: 0 %
Brady Statistic AS VP Percent: 15 %
Brady Statistic AS VS Percent: 0 %
Date Time Interrogation Session: 20221108093219
Implantable Lead Implant Date: 20110818
Implantable Lead Implant Date: 20110818
Implantable Lead Location: 753859
Implantable Lead Location: 753860
Implantable Lead Model: 5092
Implantable Lead Model: 5594
Implantable Pulse Generator Implant Date: 20110818
Lead Channel Impedance Value: 564 Ohm
Lead Channel Impedance Value: 607 Ohm
Lead Channel Pacing Threshold Amplitude: 0.625 V
Lead Channel Pacing Threshold Amplitude: 0.75 V
Lead Channel Pacing Threshold Pulse Width: 0.4 ms
Lead Channel Pacing Threshold Pulse Width: 0.4 ms
Lead Channel Setting Pacing Amplitude: 1.5 V
Lead Channel Setting Pacing Amplitude: 2 V
Lead Channel Setting Pacing Pulse Width: 0.4 ms
Lead Channel Setting Sensing Sensitivity: 2 mV

## 2021-02-03 ENCOUNTER — Ambulatory Visit (INDEPENDENT_AMBULATORY_CARE_PROVIDER_SITE_OTHER): Payer: Medicare Other

## 2021-02-03 DIAGNOSIS — I442 Atrioventricular block, complete: Secondary | ICD-10-CM | POA: Diagnosis not present

## 2021-02-11 NOTE — Progress Notes (Signed)
Remote pacemaker transmission.   

## 2021-03-18 DIAGNOSIS — H524 Presbyopia: Secondary | ICD-10-CM | POA: Diagnosis not present

## 2021-03-25 ENCOUNTER — Emergency Department (HOSPITAL_COMMUNITY): Payer: Medicare Other

## 2021-03-25 ENCOUNTER — Ambulatory Visit
Admission: EM | Admit: 2021-03-25 | Discharge: 2021-03-25 | Disposition: A | Discharge status: Home / Self Care | Payer: Medicare Other | Attending: Emergency Medicine | Admitting: Emergency Medicine

## 2021-03-25 ENCOUNTER — Other Ambulatory Visit: Payer: Self-pay

## 2021-03-25 ENCOUNTER — Encounter (HOSPITAL_COMMUNITY): Payer: Self-pay

## 2021-03-25 ENCOUNTER — Emergency Department (HOSPITAL_COMMUNITY)
Admission: EM | Admit: 2021-03-25 | Discharge: 2021-03-25 | Disposition: A | Discharge status: Home / Self Care | Payer: Medicare Other | Attending: Emergency Medicine | Admitting: Emergency Medicine

## 2021-03-25 DIAGNOSIS — R0902 Hypoxemia: Secondary | ICD-10-CM

## 2021-03-25 DIAGNOSIS — R509 Fever, unspecified: Secondary | ICD-10-CM | POA: Diagnosis not present

## 2021-03-25 DIAGNOSIS — Z950 Presence of cardiac pacemaker: Secondary | ICD-10-CM | POA: Diagnosis not present

## 2021-03-25 DIAGNOSIS — I509 Heart failure, unspecified: Secondary | ICD-10-CM | POA: Diagnosis not present

## 2021-03-25 DIAGNOSIS — E039 Hypothyroidism, unspecified: Secondary | ICD-10-CM | POA: Insufficient documentation

## 2021-03-25 DIAGNOSIS — Z20822 Contact with and (suspected) exposure to covid-19: Secondary | ICD-10-CM | POA: Diagnosis not present

## 2021-03-25 DIAGNOSIS — R6883 Chills (without fever): Secondary | ICD-10-CM | POA: Diagnosis not present

## 2021-03-25 DIAGNOSIS — Z79899 Other long term (current) drug therapy: Secondary | ICD-10-CM | POA: Diagnosis not present

## 2021-03-25 DIAGNOSIS — I2510 Atherosclerotic heart disease of native coronary artery without angina pectoris: Secondary | ICD-10-CM | POA: Diagnosis not present

## 2021-03-25 DIAGNOSIS — R051 Acute cough: Secondary | ICD-10-CM | POA: Diagnosis not present

## 2021-03-25 DIAGNOSIS — Z7901 Long term (current) use of anticoagulants: Secondary | ICD-10-CM | POA: Diagnosis not present

## 2021-03-25 DIAGNOSIS — R059 Cough, unspecified: Secondary | ICD-10-CM | POA: Diagnosis not present

## 2021-03-25 DIAGNOSIS — Z87891 Personal history of nicotine dependence: Secondary | ICD-10-CM | POA: Insufficient documentation

## 2021-03-25 DIAGNOSIS — J439 Emphysema, unspecified: Secondary | ICD-10-CM | POA: Diagnosis not present

## 2021-03-25 DIAGNOSIS — J189 Pneumonia, unspecified organism: Secondary | ICD-10-CM | POA: Diagnosis not present

## 2021-03-25 DIAGNOSIS — I110 Hypertensive heart disease with heart failure: Secondary | ICD-10-CM | POA: Diagnosis not present

## 2021-03-25 LAB — RESP PANEL BY RT-PCR (FLU A&B, COVID) ARPGX2
Influenza A by PCR: NEGATIVE
Influenza B by PCR: NEGATIVE
SARS Coronavirus 2 by RT PCR: NEGATIVE

## 2021-03-25 MED ORDER — AEROCHAMBER PLUS FLO-VU MEDIUM MISC
1.0000 | Freq: Once | Status: AC
  Administered 2021-03-25: 14:00:00 1

## 2021-03-25 MED ORDER — AMOXICILLIN-POT CLAVULANATE 875-125 MG PO TABS
1.0000 | ORAL_TABLET | Freq: Once | ORAL | Status: AC
  Administered 2021-03-25: 16:00:00 1 via ORAL
  Filled 2021-03-25: qty 1

## 2021-03-25 MED ORDER — AMOXICILLIN-POT CLAVULANATE 875-125 MG PO TABS: 1.0000 | ORAL_TABLET | Freq: Two times a day (BID) | ORAL | 0 refills | Status: DC

## 2021-03-25 MED ORDER — ALBUTEROL SULFATE HFA 108 (90 BASE) MCG/ACT IN AERS
2.0000 | INHALATION_SPRAY | Freq: Once | RESPIRATORY_TRACT | Status: AC
  Administered 2021-03-25: 14:00:00 2 via RESPIRATORY_TRACT

## 2021-03-25 MED ORDER — AZITHROMYCIN 250 MG PO TABS: 250.0000 mg | ORAL_TABLET | Freq: Every day | ORAL | 0 refills | Status: DC

## 2021-03-25 NOTE — Discharge Instructions (Addendum)
Your testing shows a possible early pneumonia You will need to take Augmentin twice a day for 7 days this is an antibiotic, it may give you diarrhea You will also need to take Zithromax daily for 5 days Please use your albuterol inhaler, 2 puffs every 4 hours as needed for difficulty breathing I would encourage you to return to the emergency department immediately for severe worsening symptoms

## 2021-03-25 NOTE — Discharge Instructions (Addendum)
We have called the ED to let them know that you are coming.  I feel that you need more of an evaluation than I am capable of doing here in the urgent care to make sure that you are safe to go home.

## 2021-03-25 NOTE — ED Triage Notes (Signed)
Pt reports chills, fever, sinus pressure, body aches, shortness of breath x 1 day.

## 2021-03-25 NOTE — ED Provider Notes (Signed)
[facility] EMERGENCY DEPARTMENT Provider Note   CSN: [id number] Arrival date & time: 03/25/21  1413     History Chief Complaint  Patient presents with   Cough    [name] is a 87 y.o. female.   Cough  This patient is a 87 year old female, she has a history of congestive heart failure as well as acid reflux hypertension paroxysmal atrial fibrillation and sick sinus syndrome.  She presents to the hospital today with a complaint of a fever with an occasional cough and a runny nose, yesterday she had a highest temperature of 102.4 with some body aches, today that seems to have improved significantly but she had some coughing and shortness of breath which brought her to the urgent care initially.  Oxygenation was poor, just under 90%, she was given albuterol inhaler which improved and the patient states that subjectively she felt incredibly better after taking that inhaler.  She denies that she is producing any phlegm with her cough, she denies having any vomiting today though she did once yesterday, she has had no diarrhea.  No sore throat, no blurred vision, no sick contacts but was around lots of people at Christmas time.  She is fully vaccinated against COVID and the flu  She is accompanied by a friend who drove her  Past Medical History:  Diagnosis Date   Cardiomyopathy ([place])    CHF (congestive heart failure) ([place])    Coronary artery disease    GERD (gastroesophageal reflux disease)    History of sick sinus syndrome    Hypertension    Hypothyroidism    Paroxysmal atrial fibrillation ([facility])    Shortness of breath dyspnea    Thyroid disease     Patient Active Problem List   Diagnosis Date Noted   SOB (shortness of breath) 12/26/2014   Cardiac pacemaker in situ 09/30/2013   Benign essential HTN 09/30/2013   Plantar fasciitis of left foot 06/21/2011    Past Surgical History:  Procedure Laterality Date   APPENDECTOMY     CARDIAC CATHETERIZATION  07/25/2005   CATARACT  EXTRACTION W/PHACO Right 08/21/2014   Procedure: CATARACT EXTRACTION PHACO AND INTRAOCULAR LENS PLACEMENT ([place]);  Surgeon: Tonny Branch, MD;  Location: [facility];  Service: Ophthalmology;  Laterality: Right;  CDE 10.94   CATARACT EXTRACTION W/PHACO Left 09/08/2014   Procedure: CATARACT EXTRACTION PHACO AND INTRAOCULAR LENS PLACEMENT (IOC);  Surgeon: Tonny Branch, MD;  Location: [facility];  Service: Ophthalmology;  Laterality: Left;  CDE:7.79   CHOLECYSTECTOMY     KNEE ARTHROSCOPY Right    NM MYOVIEW LTD  11/06/2008   PACEMAKER PLACEMENT       OB History     Gravida      Para      Term      Preterm      AB      Living  0      SAB      IAB      Ectopic      Multiple      Live Births              Family History  Problem Relation Age of Onset   Heart disease Other    Cancer Other    Kidney disease Other     Social History   Tobacco Use   Smoking status: Former    Packs/day: 0.25    Years: 15.00    Pack years: 3.75    Types: Cigarettes  Quit date: 08/13/1970    Years since quitting: 50.6   Smokeless tobacco: Never  Vaping Use   Vaping Use: Never used  Substance Use Topics   Alcohol use: No    Alcohol/week: 0.0 standard drinks   Drug use: No    Home Medications Prior to Admission medications   Medication Sig Start Date End Date Taking? Authorizing Provider  amoxicillin-clavulanate (AUGMENTIN) 875-125 MG tablet Take 1 tablet by mouth every 12 (twelve) hours. 03/25/21  Yes Noemi Chapel, MD  azithromycin (ZITHROMAX Z-PAK) 250 MG tablet Take 1 tablet (250 mg total) by mouth daily. 500mg  PO day 1, then 250mg  PO days 205 03/25/21  Yes Noemi Chapel, MD  ALPRAZolam Duanne Moron) 0.5 MG tablet Take 0.5 mg by mouth 3 (three) times daily as needed. 04/10/19   [provider]  apixaban (ELIQUIS) 2.5 MG TABS tablet Take 1 tablet (2.5 mg total) by mouth 2 (two) times daily. 10/16/20   Evans Lance, MD  Calcium Carbonate-Vitamin D 600-400 MG-UNIT chew tablet Chew 1  tablet by mouth every Saturday.     [provider]  digoxin (LANOXIN) 0.125 MG tablet Take one tablet on Monday - Friday and None on Saturday or Sunday 06/09/20   Evans Lance, MD  furosemide (LASIX) 20 MG tablet Take 20 mg by mouth 2 (two) times a week.     [provider]  levocetirizine (XYZAL) 5 MG tablet Take 5 mg by mouth every morning.     [provider]  levothyroxine (SYNTHROID, LEVOTHROID) 88 MCG tablet Take 88 mcg by mouth daily before breakfast.  12/06/16   [provider]  lisinopril (ZESTRIL) 10 MG tablet TK 1 T PO D 07/04/18   [provider]  metoprolol succinate (TOPROL-XL) 50 MG 24 hr tablet Take 50 mg by mouth daily.  11/08/16   [provider]  Omega-3 Fatty Acids (FISH OIL) 1000 MG CAPS Take 1,000 mg by mouth daily.    [provider]  Polyethyl Glycol-Propyl Glycol (SYSTANE OP) Place 1 drop into both eyes 3 (three) times daily.     [provider]  Potassium 99 MG TABS Take 1 tablet by mouth 2 (two) times daily.     [provider]  VENTOLIN HFA 108 (90 BASE) MCG/ACT inhaler Inhale 2 puffs into the lungs every 4 (four) hours as needed for wheezing or shortness of breath.  07/04/14   [provider]  vitamin C (ASCORBIC ACID) 500 MG tablet Take 500 mg by mouth every morning.     [provider]    Allergies    Macrodantin and Sulfa antibiotics  Review of Systems   Review of Systems  Respiratory:  Positive for cough.   All other systems reviewed and are negative.  Physical Exam Updated Vital Signs BP (!) 141/57 (BP Location: Right Arm)    Pulse 73    Temp 99 F (37.2 C) (Oral)    Resp 17    Ht 1.6 m (5\' 3" )    Wt 47.2 kg    SpO2 96%    BMI 18.42 kg/m   Physical Exam Vitals and nursing note reviewed.  Constitutional:      General: She is not in acute distress.    Appearance: She is well-developed.  HENT:     Head: Normocephalic and atraumatic.     Mouth/Throat:      Pharynx: No oropharyngeal exudate.  Eyes:     General: No scleral icterus.  Right eye: No discharge.        Left eye: No discharge.     Conjunctiva/sclera: Conjunctivae normal.     Pupils: Pupils are equal, round, and reactive to light.  Neck:     Thyroid: No thyromegaly.     Vascular: No JVD.  Cardiovascular:     Rate and Rhythm: Normal rate and regular rhythm.     Heart sounds: Normal heart sounds. No murmur heard.   No friction rub. No gallop.  Pulmonary:     Effort: Pulmonary effort is normal. No respiratory distress.     Breath sounds: Normal breath sounds. No wheezing or rales.     Comments: The patient's respiratory rate is very normal at 17 breaths/min, oxygen is 96 to 98% on room air and she has good air movement in both lungs.  There is no rales no wheezing or rhonchi.  She speaks in full sentences without increased work of breathing or respiratory distress, no accessory muscle use Abdominal:     General: Bowel sounds are normal. There is no distension.     Palpations: Abdomen is soft. There is no mass.     Tenderness: There is no abdominal tenderness.  Musculoskeletal:        General: No tenderness. Normal range of motion.     Cervical back: Normal range of motion and neck supple.  Lymphadenopathy:     Cervical: No cervical adenopathy.  Skin:    General: Skin is warm and dry.     Findings: No erythema or rash.  Neurological:     Mental Status: She is alert.     Coordination: Coordination normal.  Psychiatric:        Behavior: Behavior normal.    ED Results / Procedures / Treatments   Labs (all labs ordered are listed, but only abnormal results are displayed) Labs Reviewed  RESP PANEL BY RT-PCR (FLU A&B, COVID) [id number]    EKG None  Radiology DG Chest 2 View  Result Date: 03/25/2021 CLINICAL DATA:  87 year old female with cough body aches and chills EXAM: CHEST - 2 VIEW COMPARISON:  08/27/2020 FINDINGS: Cardiomediastinal silhouette unchanged in size and  contour. No evidence of central vascular congestion. No interlobular septal thickening. Stigmata of emphysema, with increased retrosternal airspace, flattened hemidiaphragms, increased AP diameter, and hyperinflation on the AP view. Cardiac pacing device on the left chest wall. New reticulonodular opacities in the right infrahilar an right lung base. Blunting of the right costophrenic angle and the costophrenic sulcus on the lateral. No pneumothorax. Degenerative changes the spine. IMPRESSION: Concern for right lower lobe/right middle lobe pneumonia and parapneumonic effusion. Emphysema Electronically Signed   By: Corrie Mckusick D.O.   On: 03/25/2021 14:51    Procedures Procedures   Medications Ordered in ED Medications  amoxicillin-clavulanate (AUGMENTIN) 875-125 MG per tablet 1 tablet (has no administration in time range)    ED Course  I have reviewed the triage vital signs and the nursing notes.  Pertinent labs & imaging results that were available during my care of the patient were reviewed by me and considered in my medical decision making (see chart for details).    MDM Rules/Calculators/A&P                          This patient presents to the ED for concern of shortness of breath and fever, this involves an extensive number of treatment options, and is a complaint that carries with it a  high risk of complications and morbidity.  The differential diagnosis includes pneumonia, COVID, flu, empyema, viral respiratory illness   Additional history obtained:  Additional history obtained from the patient's friend in the room as well as the external records including the prior urgent care visit External records from outside source obtained and reviewed including nothing acute, no recent admissions to the hospital, good follow-up with cardiology for her AV block and pacemaker   Lab Tests:  I Ordered, reviewed, and interpreted labs.  The pertinent results include: COVID and flu testing both  of which were negative   Imaging Studies ordered:  I ordered imaging studies including chest x-ray I independently visualized and interpreted imaging which showed possible right lower lobe pneumonia with a small effusion I agree with the radiologist interpretation   Cardiac Monitoring:  The patient was maintained on a cardiac monitor.  I personally viewed and interpreted the cardiac monitored which showed an underlying rhythm of: Sinus rhythm, rates below 80 bpm   Medicines ordered and prescription drug management:  I ordered medication including Augmentin for pneumonia, Reevaluation of the patient after these medicines showed that the patient improved I have reviewed the patients home medicines and have made adjustments as needed, I verified that the patient actually has an albuterol inhaler at home, she will continue to use this, she is not wheezing at this time   Critical Interventions:  Chest x-ray to identify pneumonia Initiation of antibiotics   Problem List / ED Course:  Pneumonia, treated with Augmentin as per recommendations and guidelines, added atypical coverage with Zithromax as well   Reevaluation:  After the interventions noted above, I reevaluated the patient and found that they have :improved   Dispostion:  After consideration of the diagnostic results and the patients response to treatment feel that the patent would benefit from home disposition.   I had a long discussion with the patient and her friend about going home versus coming into the hospital.  She wants to go home and agrees that this is the best disposition, given that her oxygen levels are 96% or greater I think this is reasonable.  Her lungs are very clear and she is speaking in full sentences and understands the indications for return.  The x-ray findings are subtle and not overwhelming, with oxygenation as good as it is heart rate in the 70s afebrile and normal blood pressure I think she is  low risk and can go home at this time.      Final Clinical Impression(s) / ED Diagnoses Final diagnoses:  Community acquired pneumonia of right lower lobe of lung    Rx / DC Orders ED Discharge Orders          Ordered    amoxicillin-clavulanate (AUGMENTIN) 875-125 MG tablet  Every 12 hours        03/25/21 1601    azithromycin (ZITHROMAX Z-PAK) 250 MG tablet  Daily        03/25/21 1601             Noemi Chapel, MD 03/25/21 [phone]

## 2021-03-25 NOTE — ED Triage Notes (Signed)
Fever chills, body aches and cough since yesterday.

## 2021-03-25 NOTE — ED Provider Notes (Signed)
HPI  SUBJECTIVE:  [name] is a 87 y.o. female who presents with fevers T-max 102.4, chills, headaches, body aches, nasal congestion, rhinorrhea, frontal sinus pain and pressure, cough productive of gray sputum, wheezing and shortness of breath starting yesterday.  She reports vomiting yesterday, but is tolerating small amounts of p.o. today.  No sore throat, chest pain, pressure, heaviness, dyspnea on exertion, nausea, diarrhea, abdominal pain.  No known COVID or flu exposure.  She got both the COVID and flu vaccines.  No antibiotics in the past 3 months.  No antipyretic in the past 6 hours.  She tried 2 puffs from her Ventolin inhaler, and states that made her feel better.  She has also tried saline nasal spray.  She is only using Ventolin once daily.  No aggravating factors.  Patient has a medical history of atrial fibrillation on Eliquis, sick sinus syndrome, hypertension, remote history of PE, hypothyroidism, coronary artery disease, CHF and cardiomyopathy.  No history of pulmonary disease.  PJK:DTOIZTI, [name], MD   Past Medical History:  Diagnosis Date   Cardiomyopathy [facility])    CHF (congestive heart failure) ([place])    Coronary artery disease    GERD (gastroesophageal reflux disease)    History of sick sinus syndrome    Hypertension    Hypothyroidism    Paroxysmal atrial fibrillation ([facility])    Shortness of breath dyspnea    Thyroid disease     Past Surgical History:  Procedure Laterality Date   APPENDECTOMY     CARDIAC CATHETERIZATION  07/25/2005   CATARACT EXTRACTION W/PHACO Right 08/21/2014   Procedure: CATARACT EXTRACTION PHACO AND INTRAOCULAR LENS PLACEMENT ([place]);  Surgeon: Tonny Branch, MD;  Location: [facility];  Service: Ophthalmology;  Laterality: Right;  CDE 10.94   CATARACT EXTRACTION W/PHACO Left 09/08/2014   Procedure: CATARACT EXTRACTION PHACO AND INTRAOCULAR LENS PLACEMENT (IOC);  Surgeon: Tonny Branch, MD;  Location: [facility];  Service: Ophthalmology;  Laterality: Left;  CDE:7.79    CHOLECYSTECTOMY     KNEE ARTHROSCOPY Right    NM MYOVIEW LTD  11/06/2008   PACEMAKER PLACEMENT      Family History  Problem Relation Age of Onset   Heart disease Other    Cancer Other    Kidney disease Other     Social History   Tobacco Use   Smoking status: Former    Packs/day: 0.25    Years: 15.00    Pack years: 3.75    Types: Cigarettes    Quit date: 08/13/1970    Years since quitting: 50.6   Smokeless tobacco: Never  Vaping Use   Vaping Use: Never used  Substance Use Topics   Alcohol use: No    Alcohol/week: 0.0 standard drinks   Drug use: No    No current facility-administered medications for this encounter.  Current Outpatient Medications:    ALPRAZolam (XANAX) 0.5 MG tablet, Take 0.5 mg by mouth 3 (three) times daily as needed., Disp: , Rfl:    apixaban (ELIQUIS) 2.5 MG TABS tablet, Take 1 tablet (2.5 mg total) by mouth 2 (two) times daily., Disp: 180 tablet, Rfl: 1   Calcium Carbonate-Vitamin D 600-400 MG-UNIT chew tablet, Chew 1 tablet by mouth every Saturday. , Disp: , Rfl:    digoxin (LANOXIN) 0.125 MG tablet, Take one tablet on Monday - Friday and None on Saturday or Sunday, Disp: 90 tablet, Rfl: 3   furosemide (LASIX) 20 MG tablet, Take 20 mg by mouth 2 (two) times a week. , Disp: , Rfl:  levocetirizine (XYZAL) 5 MG tablet, Take 5 mg by mouth every morning. , Disp: , Rfl:    levothyroxine (SYNTHROID, LEVOTHROID) 88 MCG tablet, Take 88 mcg by mouth daily before breakfast. , Disp: , Rfl:    lisinopril (ZESTRIL) 10 MG tablet, TK 1 T PO D, Disp: , Rfl:    metoprolol succinate (TOPROL-XL) 50 MG 24 hr tablet, Take 50 mg by mouth daily. , Disp: , Rfl:    Omega-3 Fatty Acids (FISH OIL) 1000 MG CAPS, Take 1,000 mg by mouth daily., Disp: , Rfl:    Polyethyl Glycol-Propyl Glycol (SYSTANE OP), Place 1 drop into both eyes 3 (three) times daily. , Disp: , Rfl:    Potassium 99 MG TABS, Take 1 tablet by mouth 2 (two) times daily. , Disp: , Rfl:    VENTOLIN HFA 108 (90  BASE) MCG/ACT inhaler, Inhale 2 puffs into the lungs every 4 (four) hours as needed for wheezing or shortness of breath. , Disp: , Rfl:    vitamin C (ASCORBIC ACID) 500 MG tablet, Take 500 mg by mouth every morning. , Disp: , Rfl:   Allergies  Allergen Reactions   Macrodantin Shortness Of Breath   Sulfa Antibiotics Shortness Of Breath     ROS  As noted in HPI.   Physical Exam  BP (!) 136/56 (BP Location: Right Arm)    Pulse 72    Temp 99.6 F (37.6 C) (Oral)    Resp 18    SpO2 91%   Constitutional: Well developed, well nourished, no acute distress.  Speaking in full sentences. Eyes:  EOMI, conjunctiva normal bilaterally HENT: Normocephalic, atraumatic,mucus membranes moist Respiratory: Normal inspiratory effort, poor air movements, lungs clear bilaterally. Cardiovascular: Normal rate, regular rhythm, no murmurs, rubs, gallops GI: nondistended skin: No rash, skin intact Musculoskeletal: no deformities Neurologic: Alert & oriented x 3, no focal neuro deficits Psychiatric: Speech and behavior appropriate   ED Course   Medications  albuterol (VENTOLIN HFA) 108 (90 Base) MCG/ACT inhaler 2 puff (2 puffs Inhalation Given 03/25/21 1343)  AeroChamber Plus Flo-Vu Medium MISC 1 each (1 each Other Given 03/25/21 1343)    No orders of the defined types were placed in this encounter.   No results found for this or any previous visit (from the past 24 hour(s)). No results found.  ED Clinical Impression  1. Fever, unspecified fever cause   2. Acute cough   3. Hypoxia      ED Assessment/Plan  Patient's oxygen saturation is consistently 88 to 89% on room air.  She has very poor air movement.  I am concerned that she has pneumonia with the fevers, chills.  Could be COVID, influenza.  We do not have x-ray available here today, nor do I have access to timely labs.  I long discussion with the patient and husband, I feel that it would be best for her to go to the emergency department  for further work-up and management.  I do not feel that it is safe to send the patient home at this time.    Giving several puffs from an albuterol inhaler with a spacer to see if it improves her oxygen saturation.  If it does, feel that she can go via private vehicle to the emergency department.  Her husband will take her.  If she does not improve, will send via EMS.  On reevaluation, patient states that she feels better.  Her oxygen saturation on room air is 91%.  I feel that she is stable  to go by private vehicle.  I reemphasized the importance of going to the emergency department immediately.  Discussed medical decision making, rationale for transfer to the emergency department with the patient.  Patient agrees with plan.  Meds ordered this encounter  Medications   albuterol (VENTOLIN HFA) 108 (90 Base) MCG/ACT inhaler 2 puff   AeroChamber Plus Flo-Vu Medium MISC 1 each      *This clinic note was created using Dragon dictation software. Therefore, there may be occasional mistakes despite careful proofreading.  ?    Melynda Ripple, MD 03/25/21 1355

## 2021-04-01 DIAGNOSIS — Z681 Body mass index (BMI) 19 or less, adult: Secondary | ICD-10-CM | POA: Diagnosis not present

## 2021-04-01 DIAGNOSIS — J189 Pneumonia, unspecified organism: Secondary | ICD-10-CM | POA: Diagnosis not present

## 2021-04-15 DIAGNOSIS — J189 Pneumonia, unspecified organism: Secondary | ICD-10-CM | POA: Diagnosis not present

## 2021-04-15 DIAGNOSIS — Z681 Body mass index (BMI) 19 or less, adult: Secondary | ICD-10-CM | POA: Diagnosis not present

## 2021-04-15 DIAGNOSIS — J449 Chronic obstructive pulmonary disease, unspecified: Secondary | ICD-10-CM | POA: Diagnosis not present

## 2021-04-24 ENCOUNTER — Inpatient Hospital Stay (HOSPITAL_COMMUNITY)
Admission: EM | Admit: 2021-04-24 | Discharge: 2021-04-28 | DRG: 291 | Disposition: A | Discharge status: Skilled Nursing Facility | Payer: Medicare Other | Attending: Family Medicine | Admitting: Family Medicine

## 2021-04-24 ENCOUNTER — Other Ambulatory Visit: Payer: Self-pay

## 2021-04-24 ENCOUNTER — Inpatient Hospital Stay (HOSPITAL_COMMUNITY): Payer: Medicare Other

## 2021-04-24 ENCOUNTER — Encounter (HOSPITAL_COMMUNITY): Payer: Self-pay

## 2021-04-24 ENCOUNTER — Emergency Department (HOSPITAL_COMMUNITY): Payer: Medicare Other

## 2021-04-24 DIAGNOSIS — N390 Urinary tract infection, site not specified: Secondary | ICD-10-CM | POA: Diagnosis present

## 2021-04-24 DIAGNOSIS — Z79890 Hormone replacement therapy (postmenopausal): Secondary | ICD-10-CM | POA: Diagnosis not present

## 2021-04-24 DIAGNOSIS — I110 Hypertensive heart disease with heart failure: Secondary | ICD-10-CM | POA: Diagnosis not present

## 2021-04-24 DIAGNOSIS — I517 Cardiomegaly: Secondary | ICD-10-CM | POA: Diagnosis not present

## 2021-04-24 DIAGNOSIS — I5031 Acute diastolic (congestive) heart failure: Secondary | ICD-10-CM

## 2021-04-24 DIAGNOSIS — I509 Heart failure, unspecified: Secondary | ICD-10-CM | POA: Insufficient documentation

## 2021-04-24 DIAGNOSIS — I2510 Atherosclerotic heart disease of native coronary artery without angina pectoris: Secondary | ICD-10-CM | POA: Diagnosis not present

## 2021-04-24 DIAGNOSIS — I480 Paroxysmal atrial fibrillation: Secondary | ICD-10-CM | POA: Diagnosis not present

## 2021-04-24 DIAGNOSIS — Z950 Presence of cardiac pacemaker: Secondary | ICD-10-CM

## 2021-04-24 DIAGNOSIS — I5043 Acute on chronic combined systolic (congestive) and diastolic (congestive) heart failure: Secondary | ICD-10-CM | POA: Diagnosis not present

## 2021-04-24 DIAGNOSIS — J811 Chronic pulmonary edema: Secondary | ICD-10-CM | POA: Diagnosis not present

## 2021-04-24 DIAGNOSIS — I495 Sick sinus syndrome: Secondary | ICD-10-CM | POA: Diagnosis present

## 2021-04-24 DIAGNOSIS — Z87891 Personal history of nicotine dependence: Secondary | ICD-10-CM

## 2021-04-24 DIAGNOSIS — I161 Hypertensive emergency: Secondary | ICD-10-CM | POA: Diagnosis not present

## 2021-04-24 DIAGNOSIS — J9601 Acute respiratory failure with hypoxia: Secondary | ICD-10-CM | POA: Diagnosis present

## 2021-04-24 DIAGNOSIS — R0600 Dyspnea, unspecified: Secondary | ICD-10-CM

## 2021-04-24 DIAGNOSIS — R636 Underweight: Secondary | ICD-10-CM | POA: Diagnosis not present

## 2021-04-24 DIAGNOSIS — Z66 Do not resuscitate: Secondary | ICD-10-CM | POA: Diagnosis present

## 2021-04-24 DIAGNOSIS — R0902 Hypoxemia: Secondary | ICD-10-CM | POA: Diagnosis not present

## 2021-04-24 DIAGNOSIS — R059 Cough, unspecified: Secondary | ICD-10-CM | POA: Diagnosis not present

## 2021-04-24 DIAGNOSIS — Z681 Body mass index (BMI) 19 or less, adult: Secondary | ICD-10-CM

## 2021-04-24 DIAGNOSIS — Z79899 Other long term (current) drug therapy: Secondary | ICD-10-CM | POA: Diagnosis not present

## 2021-04-24 DIAGNOSIS — E039 Hypothyroidism, unspecified: Secondary | ICD-10-CM | POA: Diagnosis present

## 2021-04-24 DIAGNOSIS — J90 Pleural effusion, not elsewhere classified: Secondary | ICD-10-CM | POA: Diagnosis not present

## 2021-04-24 DIAGNOSIS — Z7901 Long term (current) use of anticoagulants: Secondary | ICD-10-CM

## 2021-04-24 DIAGNOSIS — J449 Chronic obstructive pulmonary disease, unspecified: Secondary | ICD-10-CM | POA: Diagnosis not present

## 2021-04-24 DIAGNOSIS — J189 Pneumonia, unspecified organism: Secondary | ICD-10-CM | POA: Diagnosis not present

## 2021-04-24 DIAGNOSIS — R079 Chest pain, unspecified: Secondary | ICD-10-CM | POA: Diagnosis not present

## 2021-04-24 DIAGNOSIS — I160 Hypertensive urgency: Secondary | ICD-10-CM

## 2021-04-24 DIAGNOSIS — B9620 Unspecified Escherichia coli [E. coli] as the cause of diseases classified elsewhere: Secondary | ICD-10-CM | POA: Diagnosis not present

## 2021-04-24 DIAGNOSIS — Z20822 Contact with and (suspected) exposure to covid-19: Secondary | ICD-10-CM | POA: Diagnosis present

## 2021-04-24 DIAGNOSIS — R0689 Other abnormalities of breathing: Secondary | ICD-10-CM

## 2021-04-24 DIAGNOSIS — Z8701 Personal history of pneumonia (recurrent): Secondary | ICD-10-CM | POA: Diagnosis not present

## 2021-04-24 DIAGNOSIS — E872 Acidosis, unspecified: Secondary | ICD-10-CM | POA: Diagnosis present

## 2021-04-24 LAB — CBC WITH DIFFERENTIAL/PLATELET
Abs Immature Granulocytes: 0.19 10*3/uL — ABNORMAL HIGH (ref 0.00–0.07)
Basophils Absolute: 0 10*3/uL (ref 0.0–0.1)
Basophils Relative: 0 %
Eosinophils Absolute: 0 10*3/uL (ref 0.0–0.5)
Eosinophils Relative: 0 %
HCT: 42.3 % (ref 36.0–46.0)
Hemoglobin: 13 g/dL (ref 12.0–15.0)
Immature Granulocytes: 1 %
Lymphocytes Relative: 11 %
Lymphs Abs: 2.1 10*3/uL (ref 0.7–4.0)
MCH: 29.8 pg (ref 26.0–34.0)
MCHC: 30.7 g/dL (ref 30.0–36.0)
MCV: 97 fL (ref 80.0–100.0)
Monocytes Absolute: 1.8 10*3/uL — ABNORMAL HIGH (ref 0.1–1.0)
Monocytes Relative: 10 %
Neutro Abs: 14.2 10*3/uL — ABNORMAL HIGH (ref 1.7–7.7)
Neutrophils Relative %: 78 %
Platelets: 190 10*3/uL (ref 150–400)
RBC: 4.36 MIL/uL (ref 3.87–5.11)
RDW: 15.1 % (ref 11.5–15.5)
WBC: 18.4 10*3/uL — ABNORMAL HIGH (ref 4.0–10.5)
nRBC: 0 % (ref 0.0–0.2)

## 2021-04-24 LAB — GLUCOSE, CAPILLARY: Glucose-Capillary: 149 mg/dL — ABNORMAL HIGH (ref 70–99)

## 2021-04-24 LAB — LACTIC ACID, PLASMA
Lactic Acid, Venous: 2.2 mmol/L (ref 0.5–1.9)
Lactic Acid, Venous: 1.7 mmol/L (ref 0.5–1.9)

## 2021-04-24 LAB — ECHOCARDIOGRAM COMPLETE
AR max vel: 1.8 cm2
AV Area VTI: 2.36 cm2
AV Area mean vel: 1.96 cm2
AV Mean grad: 3.5 mmHg
AV Peak grad: 8.7 mmHg
Ao pk vel: 1.47 m/s
Area-P 1/2: 3.48 cm2
Calc EF: 26.2 %
Height: 63 in
MV M vel: 5.33 m/s
MV Peak grad: 113.6 mmHg
Radius: 0.3 cm
S' Lateral: 4.4 cm
Single Plane A2C EF: 25 %
Single Plane A4C EF: 27.6 %
Weight: 1840 oz

## 2021-04-24 LAB — COMPREHENSIVE METABOLIC PANEL
ALT: 160 U/L — ABNORMAL HIGH (ref 0–44)
AST: 108 U/L — ABNORMAL HIGH (ref 15–41)
Albumin: 3.6 g/dL (ref 3.5–5.0)
Alkaline Phosphatase: 143 U/L — ABNORMAL HIGH (ref 38–126)
Anion gap: 7 (ref 5–15)
BUN: 32 mg/dL — ABNORMAL HIGH (ref 8–23)
CO2: 27 mmol/L (ref 22–32)
Calcium: 9.3 mg/dL (ref 8.9–10.3)
Chloride: 103 mmol/L (ref 98–111)
Creatinine, Ser: 1.12 mg/dL — ABNORMAL HIGH (ref 0.44–1.00)
GFR, Estimated: 46 mL/min — ABNORMAL LOW (ref >60)
Glucose, Bld: 158 mg/dL — ABNORMAL HIGH (ref 70–99)
Potassium: 4.8 mmol/L (ref 3.5–5.1)
Sodium: 137 mmol/L (ref 135–145)
Total Bilirubin: 0.9 mg/dL (ref 0.3–1.2)
Total Protein: 6.8 g/dL (ref 6.5–8.1)

## 2021-04-24 LAB — URINALYSIS, MICROSCOPIC (REFLEX)

## 2021-04-24 LAB — RESP PANEL BY RT-PCR (FLU A&B, COVID) ARPGX2
Influenza A by PCR: NEGATIVE
Influenza B by PCR: NEGATIVE
SARS Coronavirus 2 by RT PCR: NEGATIVE

## 2021-04-24 LAB — URINALYSIS, ROUTINE W REFLEX MICROSCOPIC
Bilirubin Urine: NEGATIVE
Glucose, UA: NEGATIVE mg/dL
Hgb urine dipstick: NEGATIVE
Ketones, ur: NEGATIVE mg/dL
Nitrite: POSITIVE — AB
Protein, ur: 30 mg/dL — AB
Specific Gravity, Urine: 1.02 (ref 1.005–1.030)
pH: 6 (ref 5.0–8.0)

## 2021-04-24 LAB — DIGOXIN LEVEL: Digoxin Level: 0.9 ng/mL (ref 0.8–2.0)

## 2021-04-24 LAB — BRAIN NATRIURETIC PEPTIDE: B Natriuretic Peptide: 2877 pg/mL — ABNORMAL HIGH (ref 0.0–100.0)

## 2021-04-24 LAB — APTT: aPTT: 24 seconds (ref 24–36)

## 2021-04-24 LAB — TROPONIN I (HIGH SENSITIVITY)
Troponin I (High Sensitivity): 33 ng/L — ABNORMAL HIGH (ref <18)
Troponin I (High Sensitivity): 39 ng/L — ABNORMAL HIGH (ref <18)

## 2021-04-24 LAB — MRSA NEXT GEN BY PCR, NASAL: MRSA by PCR Next Gen: NOT DETECTED

## 2021-04-24 LAB — PROTIME-INR
INR: 1.2 (ref 0.8–1.2)
Prothrombin Time: 15 seconds (ref 11.4–15.2)

## 2021-04-24 MED ORDER — POLYETHYLENE GLYCOL 3350 17 G PO PACK: 17.0000 g | PACK | Freq: Every day | ORAL | Status: DC | PRN

## 2021-04-24 MED ORDER — LEVOTHYROXINE SODIUM 88 MCG PO TABS
88.0000 ug | ORAL_TABLET | Freq: Every day | ORAL | Status: DC
  Administered 2021-04-24 – 2021-04-28 (×5): 88 ug via ORAL
  Filled 2021-04-24 (×5): qty 1

## 2021-04-24 MED ORDER — BISACODYL 10 MG RE SUPP: 10.0000 mg | Freq: Every day | RECTAL | Status: DC | PRN

## 2021-04-24 MED ORDER — POLYVINYL ALCOHOL 1.4 % OP SOLN
1.0000 [drp] | Freq: Three times a day (TID) | OPHTHALMIC | Status: DC
  Administered 2021-04-24 – 2021-04-28 (×13): 1 [drp] via OPHTHALMIC
  Filled 2021-04-24 (×2): qty 15

## 2021-04-24 MED ORDER — DIGOXIN 125 MCG PO TABS
0.0625 mg | ORAL_TABLET | Freq: Every day | ORAL | Status: DC
  Administered 2021-04-24: 0.0625 mg via ORAL
  Filled 2021-04-24: qty 1

## 2021-04-24 MED ORDER — FUROSEMIDE 10 MG/ML IJ SOLN
20.0000 mg | Freq: Two times a day (BID) | INTRAMUSCULAR | Status: DC
  Administered 2021-04-24 – 2021-04-25 (×2): 20 mg via INTRAVENOUS
  Filled 2021-04-24 (×2): qty 2

## 2021-04-24 MED ORDER — SODIUM CHLORIDE 0.9% FLUSH
3.0000 mL | Freq: Two times a day (BID) | INTRAVENOUS | Status: DC
  Administered 2021-04-24 – 2021-04-28 (×6): 3 mL via INTRAVENOUS

## 2021-04-24 MED ORDER — APIXABAN 2.5 MG PO TABS
2.5000 mg | ORAL_TABLET | Freq: Two times a day (BID) | ORAL | Status: DC
  Administered 2021-04-24 – 2021-04-28 (×9): 2.5 mg via ORAL
  Filled 2021-04-24 (×9): qty 1

## 2021-04-24 MED ORDER — ACETAMINOPHEN 325 MG PO TABS: 650.0000 mg | ORAL_TABLET | Freq: Four times a day (QID) | ORAL | Status: DC | PRN

## 2021-04-24 MED ORDER — HYDRALAZINE HCL 20 MG/ML IJ SOLN
10.0000 mg | INTRAMUSCULAR | Status: DC | PRN
  Administered 2021-04-25: 10 mg via INTRAVENOUS
  Filled 2021-04-24: qty 1

## 2021-04-24 MED ORDER — ALBUTEROL SULFATE (2.5 MG/3ML) 0.083% IN NEBU
2.5000 mg | INHALATION_SOLUTION | RESPIRATORY_TRACT | Status: DC | PRN
  Administered 2021-04-27: 2.5 mg via RESPIRATORY_TRACT
  Filled 2021-04-24: qty 3

## 2021-04-24 MED ORDER — SODIUM CHLORIDE 0.9 % IV SOLN
1.0000 g | INTRAVENOUS | Status: DC
  Administered 2021-04-24 – 2021-04-26 (×3): 1 g via INTRAVENOUS
  Filled 2021-04-24 (×3): qty 10

## 2021-04-24 MED ORDER — SODIUM CHLORIDE 0.9% FLUSH
3.0000 mL | Freq: Two times a day (BID) | INTRAVENOUS | Status: DC
  Administered 2021-04-24 – 2021-04-28 (×9): 3 mL via INTRAVENOUS

## 2021-04-24 MED ORDER — DIGOXIN 125 MCG PO TABS
0.1250 mg | ORAL_TABLET | ORAL | Status: DC
  Administered 2021-04-26 – 2021-04-28 (×3): 0.125 mg via ORAL
  Filled 2021-04-24 (×3): qty 1

## 2021-04-24 MED ORDER — IPRATROPIUM-ALBUTEROL 0.5-2.5 (3) MG/3ML IN SOLN
3.0000 mL | Freq: Once | RESPIRATORY_TRACT | Status: AC
  Administered 2021-04-24: 3 mL via RESPIRATORY_TRACT
  Filled 2021-04-24: qty 3

## 2021-04-24 MED ORDER — ONDANSETRON HCL 4 MG PO TABS: 4.0000 mg | ORAL_TABLET | Freq: Four times a day (QID) | ORAL | Status: DC | PRN

## 2021-04-24 MED ORDER — ALPRAZOLAM 0.5 MG PO TABS
0.5000 mg | ORAL_TABLET | Freq: Three times a day (TID) | ORAL | Status: DC | PRN
  Administered 2021-04-25: 0.5 mg via ORAL
  Filled 2021-04-24: qty 1

## 2021-04-24 MED ORDER — NITROGLYCERIN 0.4 MG SL SUBL
0.4000 mg | SUBLINGUAL_TABLET | Freq: Once | SUBLINGUAL | Status: AC
  Administered 2021-04-24: 0.4 mg via SUBLINGUAL
  Filled 2021-04-24: qty 1

## 2021-04-24 MED ORDER — NITROGLYCERIN IN D5W 200-5 MCG/ML-% IV SOLN
5.0000 ug/min | INTRAVENOUS | Status: DC
  Administered 2021-04-24: 5 ug/min via INTRAVENOUS
  Filled 2021-04-24: qty 250

## 2021-04-24 MED ORDER — FUROSEMIDE 10 MG/ML IJ SOLN
80.0000 mg | Freq: Once | INTRAMUSCULAR | Status: AC
  Administered 2021-04-24: 80 mg via INTRAVENOUS
  Filled 2021-04-24: qty 8

## 2021-04-24 MED ORDER — ALBUTEROL SULFATE (2.5 MG/3ML) 0.083% IN NEBU
2.5000 mg | INHALATION_SOLUTION | Freq: Once | RESPIRATORY_TRACT | Status: AC
  Administered 2021-04-24: 2.5 mg via RESPIRATORY_TRACT
  Filled 2021-04-24: qty 3

## 2021-04-24 MED ORDER — METOPROLOL SUCCINATE ER 50 MG PO TB24
50.0000 mg | ORAL_TABLET | Freq: Every day | ORAL | Status: DC
  Administered 2021-04-24 – 2021-04-25 (×2): 50 mg via ORAL
  Filled 2021-04-24 (×2): qty 1

## 2021-04-24 MED ORDER — NITROGLYCERIN IN D5W 200-5 MCG/ML-% IV SOLN
5.0000 ug/min | INTRAVENOUS | Status: AC
  Administered 2021-04-24: 50 ug/min via INTRAVENOUS
  Administered 2021-04-25: 115 ug/min via INTRAVENOUS
  Administered 2021-04-25: 110 ug/min via INTRAVENOUS
  Filled 2021-04-24 (×3): qty 250

## 2021-04-24 MED ORDER — METHYLPREDNISOLONE SODIUM SUCC 125 MG IJ SOLR
80.0000 mg | Freq: Once | INTRAMUSCULAR | Status: AC
  Administered 2021-04-24: 80 mg via INTRAVENOUS
  Filled 2021-04-24: qty 2

## 2021-04-24 MED ORDER — SODIUM CHLORIDE 0.9% FLUSH: 3.0000 mL | INTRAVENOUS | Status: DC | PRN

## 2021-04-24 MED ORDER — LORATADINE 10 MG PO TABS
10.0000 mg | ORAL_TABLET | Freq: Every morning | ORAL | Status: DC
  Administered 2021-04-24 – 2021-04-28 (×5): 10 mg via ORAL
  Filled 2021-04-24 (×5): qty 1

## 2021-04-24 MED ORDER — ACETAMINOPHEN 650 MG RE SUPP: 650.0000 mg | Freq: Four times a day (QID) | RECTAL | Status: DC | PRN

## 2021-04-24 MED ORDER — DM-GUAIFENESIN ER 30-600 MG PO TB12
1.0000 | ORAL_TABLET | Freq: Two times a day (BID) | ORAL | Status: DC
  Administered 2021-04-24 – 2021-04-28 (×9): 1 via ORAL
  Filled 2021-04-24 (×9): qty 1

## 2021-04-24 MED ORDER — SODIUM CHLORIDE 0.9 % IV SOLN: 250.0000 mL | INTRAVENOUS | Status: DC | PRN

## 2021-04-24 MED ORDER — TRAZODONE HCL 50 MG PO TABS: 50.0000 mg | ORAL_TABLET | Freq: Every evening | ORAL | Status: DC | PRN

## 2021-04-24 MED ORDER — ONDANSETRON HCL 4 MG/2ML IJ SOLN
4.0000 mg | Freq: Four times a day (QID) | INTRAMUSCULAR | Status: DC | PRN
  Administered 2021-04-25: 4 mg via INTRAVENOUS
  Filled 2021-04-24: qty 2

## 2021-04-24 MED ORDER — CHLORHEXIDINE GLUCONATE CLOTH 2 % EX PADS
6.0000 | MEDICATED_PAD | Freq: Every day | CUTANEOUS | Status: DC
  Administered 2021-04-24 – 2021-04-28 (×5): 6 via TOPICAL

## 2021-04-24 NOTE — ED Notes (Signed)
Pt placed on 4L oxygen via [place]

## 2021-04-24 NOTE — ED Triage Notes (Signed)
Pt brought in by [facility] for SOB, pt says she was dx with PNA the end of December, pt has been on 2 different anbx and now taking steroids, has been to PCP twice and symptoms continue- pt also has a productive cough with greenish brown sputum. Pt 98% on room air per EMS. Pt 88% on room air in triage.

## 2021-04-24 NOTE — ED Notes (Signed)
Breakfast tray given to pt 

## 2021-04-24 NOTE — ED Notes (Signed)
Attending at bedside.

## 2021-04-24 NOTE — ED Notes (Signed)
Bilateral hearing aides noted . Pt calling family to tell them she is being admitted.

## 2021-04-24 NOTE — H&P (Signed)
Patient Demographics:    [name], is a 88 y.o. female  MRN: [id number]   DOB - March 30, 1924  Admit Date - 04/24/2021  Outpatient Primary MD for the patient is Sharilyn Sites, MD   Assessment & Plan:    Principal Problem:   Acute on chronic combined systolic and diastolic CHF (congestive heart failure) ([place]) Active Problems:   Hypertensive emergency   Cardiac pacemaker in situ  -  1)Acute on chronic combined systolic and diastolic dysfunction CHF exacerbation resulting in hypoxic respiratory failure- --Chest x-ray is suggestive of congestive heart failure - initial troponin at 33, repeat 39 -BNP 2877 -Echo with EF of 20 to 25% with global hypokinesis, and grade 1 diastolic dysfunction -IV Lasix as ordered, = Fluid input and output monitoring and Daily weights  2)Social/Ethics-- While in ED RN Lyn Henri at bedside and ED paramedic at bedside, patient requesting DNR status, patient verbalizes understanding of DNR status and implications -Please see separate documentation from ED RN [place] directives and plan of care discussed with patient's nephew/POA Dr Corey Skains--- he would like to have further conversations with patient prior to making final decision on CODE STATUS -PTA patient lives alone, she still drives locally in town  3) acute hypoxic respiratory failure secondary to #1 above--- continue supplemental oxygen anticipate improvement with diuresis  4)Lactic Acidosis-lactic acid is 2.2, suspect this is secondary to hypoxia rather than frank sepsis  5) leukocytosis--- may be partly due to recent repeated doses of steroids -Treat presumed UTI as below  6) possible UTI--IV Rocephin pending culture data  7) PAFib/SSS, s/p PPM -----Dig level is low therapeutic at 0.9, continue digoxin  8)  hypertensive emergency--- in the ED checked on the blood pressure was over 287 diastolic blood pressure was over 100 patient with dyspnea, hypoxia and worsening congestive heart failure consistent with hypertensive emergency --IV nitro drip as ordered  Disposition/Need for in-Hospital Stay- patient unable to be discharged at this time due to acute emergency requiring IV nitro drip, CHF exacerbation requiring IV diuresis  Status is: Inpatient  Remains inpatient appropriate because:   Dispo: The patient is from: Home              Anticipated d/c is to:  Home with home health versus SNF rehab              Anticipated d/c date is: 2 days              Patient currently is not medically stable to d/c. Barriers: Not Clinically Stable-    With History of - Reviewed by me  Past Medical History:  Diagnosis Date   Cardiomyopathy ([place])    CHF (congestive heart failure) ([facility])    Coronary artery disease    GERD (gastroesophageal reflux disease)    History of sick sinus syndrome    Hypertension    Hypothyroidism    Paroxysmal atrial fibrillation ([place])    Shortness  of breath dyspnea    Thyroid disease       Past Surgical History:  Procedure Laterality Date   APPENDECTOMY     CARDIAC CATHETERIZATION  07/25/2005   CATARACT EXTRACTION W/PHACO Right 08/21/2014   Procedure: CATARACT EXTRACTION PHACO AND INTRAOCULAR LENS PLACEMENT ([place]);  Surgeon: Tonny Branch, MD;  Location: [facility];  Service: Ophthalmology;  Laterality: Right;  CDE 10.94   CATARACT EXTRACTION W/PHACO Left 09/08/2014   Procedure: CATARACT EXTRACTION PHACO AND INTRAOCULAR LENS PLACEMENT ([facility]);  Surgeon: Tonny Branch, MD;  Location: [facility];  Service: Ophthalmology;  Laterality: Left;  CDE:7.79   CHOLECYSTECTOMY     KNEE ARTHROSCOPY Right    NM MYOVIEW LTD  11/06/2008   PACEMAKER PLACEMENT        Chief Complaint  Patient presents with   Shortness of Breath    Cough/PNA x 1 month      HPI:    [name]  is a 88 y.o. female  with h/o HTN, PAFib/SSS, s/p PPM , CAD, chronic combined systolic and diastolic dysfunction CHF and hearing loss who presents to the ED with worsening dyspnea and is found to be hypoxic in the ED - -Additional history obtained from patient's nephew/POA Dr Corey Skains -Apparently patient does not have any respiratory symptoms of shortness of breath for several weeks she was diagnosed with pneumonia back in December 2022, treated with antibiotics over the last couple weeks she was given additional steroids and antibiotics by PCP but symptoms persisted - In the ED O2 sats 88% on room air -Patient admits to productive cough at times with greenish to brown sputum, No fever no chills  In ED lactic acid is 2.2 suspect this is secondary to hypoxia rather than frank sepsis --Chest x-ray is suggestive of congestive heart failure - initial troponin at 33, repeat 39 -BNP 2877 -COVID and flu negative -UA suggestive of UTI -Echo with EF of 20 to 25% with global hypokinesis, and grade 1 diastolic dysfunction -Dig level is low therapeutic at 0.9 -Creatinine 1.1, potassium 4.8 While in ED RN Lyn Henri at bedside and ED paramedic at bedside, patient requesting DNR status, patient verbalizes understanding of DNR status and implications -Please see separate documentation from ED RN Lyn Henri  in the ED checked on the blood pressure was over 810 diastolic blood pressure was over 100 patient with dyspnea, hypoxia and worsening congestive heart failure consistent with hypertensive emergency --IV nitro drip as ordered   Review of systems:    In addition to the HPI above,   A full Review of  Systems was done, all other systems reviewed are negative except as noted above in HPI , .    Social History:  Reviewed by me    Social History   Tobacco Use   Smoking status: Former    Packs/day: 0.25    Years: 15.00    Pack years: 3.75    Types: Cigarettes    Quit date: 08/13/1970    Years since quitting:  50.7   Smokeless tobacco: Never  Substance Use Topics   Alcohol use: No    Alcohol/week: 0.0 standard drinks       Family History :  Reviewed by me    Family History  Problem Relation Age of Onset   Heart disease Other    Cancer Other    Kidney disease Other     Home Medications:   Prior to Admission medications   Medication Sig Start Date End Date Taking? Authorizing  Provider  ALPRAZolam Duanne Moron) 0.5 MG tablet Take 0.5 mg by mouth 3 (three) times daily as needed. 04/10/19  Yes [provider]  apixaban (ELIQUIS) 2.5 MG TABS tablet Take 1 tablet (2.5 mg total) by mouth 2 (two) times daily. 10/16/20  Yes Evans Lance, MD  diclofenac Sodium (VOLTAREN) 1 % GEL Apply 2 g topically 4 (four) times daily.   Yes [provider]  digoxin (LANOXIN) 0.125 MG tablet Take one tablet on Monday - Friday and None on Saturday or Sunday 06/09/20  Yes Evans Lance, MD  ergocalciferol (VITAMIN D2) 1.25 MG (50000 UT) capsule Take 50,000 Units by mouth once a week.   Yes [provider]  furosemide (LASIX) 20 MG tablet Take 20 mg by mouth 2 (two) times a week.    Yes [provider]  ipratropium (ATROVENT) 0.03 % nasal spray ipratropium bromide 21 mcg (0.03 %) nasal spray   Yes [provider]  levocetirizine (XYZAL) 5 MG tablet Take 5 mg by mouth every morning.    Yes [provider]  levothyroxine (SYNTHROID, LEVOTHROID) 88 MCG tablet Take 88 mcg by mouth daily before breakfast.  12/06/16  Yes [provider]  lisinopril (ZESTRIL) 10 MG tablet TK 1 T PO D 07/04/18  Yes [provider]  metoprolol succinate (TOPROL-XL) 50 MG 24 hr tablet Take 50 mg by mouth daily.  11/08/16  Yes [provider]  Multiple Vitamins-Minerals (PRESERVISION AREDS PO) Take 1 tablet by mouth 2 (two) times daily.   Yes [provider]  Omega-3 Fatty Acids (FISH OIL) 1000 MG CAPS Take 1,000 mg by mouth daily.   Yes [provider]   Polyethyl Glycol-Propyl Glycol (SYSTANE OP) Place 1 drop into both eyes 3 (three) times daily.    Yes [provider]  Potassium 99 MG TABS Take 1 tablet by mouth 2 (two) times daily.    Yes [provider]  predniSONE (DELTASONE) 10 MG tablet Take 10 mg by mouth. Dose Pack dose down 04/15/21  Yes [provider]  VENTOLIN HFA 108 (90 BASE) MCG/ACT inhaler Inhale 2 puffs into the lungs every 4 (four) hours as needed for wheezing or shortness of breath.  07/04/14  Yes [provider]  vitamin C (ASCORBIC ACID) 500 MG tablet Take 500 mg by mouth every morning.    Yes [provider]  amoxicillin-clavulanate (AUGMENTIN) 875-125 MG tablet Take 1 tablet by mouth every 12 (twelve) hours. Patient not taking: Reported on 04/24/2021 03/25/21   Noemi Chapel, MD  azithromycin (ZITHROMAX Z-PAK) 250 MG tablet Take 1 tablet (250 mg total) by mouth daily. 500mg  PO day 1, then 250mg  PO days 205 Patient not taking: Reported on 04/24/2021 03/25/21   Noemi Chapel, MD  Calcium Carbonate-Vitamin D 600-400 MG-UNIT chew tablet Chew 1 tablet by mouth every Saturday.  Patient not taking: Reported on 04/24/2021    [provider]     Allergies:     Allergies  Allergen Reactions   Macrodantin Shortness Of Breath   Sulfa Antibiotics Shortness Of Breath     Physical Exam:   Vitals  Blood pressure (!) 141/66, pulse 70, temperature 97.8 F (36.6 C), temperature source Oral, resp. rate 17, height 5\' 3"  (1.6 m), weight 46.6 kg, SpO2 99 %.  Physical Examination: General appearance - alert,  and in no distress Mental status - alert, oriented to person, place, and time,  Nose- [place] 2L/min Ears- HOH Eyes - sclera anicteric Neck - supple, no  JVD elevation , Chest -diminished breath sounds with faint bibasilar rales, Heart - S1 and S2 normal, , pacemaker in situ Abdomen - soft, nontender, nondistended, no masses or organomegaly Neurological - screening mental status  exam normal, neck supple without rigidity, cranial nerves II through XII intact, DTR's normal and symmetric Extremities - 1+ pedal edema noted, intact peripheral pulses  Skin - warm, dry   Data Review:    CBC Recent Labs  Lab 04/24/21 0611  WBC 18.4*  HGB 13.0  HCT 42.3  PLT 190  MCV 97.0  MCH 29.8  MCHC 30.7  RDW 15.1  LYMPHSABS 2.1  MONOABS 1.8*  EOSABS 0.0  BASOSABS 0.0   ------------------------------------------------------------------------------------------------------------------  Chemistries  Recent Labs  Lab 04/24/21 0611  NA 137  K 4.8  CL 103  CO2 27  GLUCOSE 158*  BUN 32*  CREATININE 1.12*  CALCIUM 9.3  AST 108*  ALT 160*  ALKPHOS 143*  BILITOT 0.9   ------------------------------------------------------------------------------------------------------------------ estimated creatinine clearance is 22.6 mL/min (A) (by C-G formula based on SCr of 1.12 mg/dL (H)). ------------------------------------------------------------------------------------------------------------------ No results for input(s): TSH, T4TOTAL, T3FREE, THYROIDAB in the last 72 hours.  Invalid input(s): FREET3   Coagulation profile Recent Labs  Lab 04/24/21 0611  INR 1.2   ------------------------------------------------------------------------------------------------------------------- No results for input(s): DDIMER in the last 72 hours. -------------------------------------------------------------------------------------------------------------------  Cardiac Enzymes No results for input(s): CKMB, TROPONINI, MYOGLOBIN in the last 168 hours.  Invalid input(s): CK ------------------------------------------------------------------------------------------------------------------    Component Value Date/Time   BNP 2,877.0 (H) 04/24/2021 0612      ---------------------------------------------------------------------------------------------------------------  Urinalysis    Component Value Date/Time   COLORURINE YELLOW 04/24/2021 0856   APPEARANCEUR CLEAR 04/24/2021 0856   LABSPEC 1.020 04/24/2021 0856   PHURINE 6.0 04/24/2021 0856   GLUCOSEU NEGATIVE 04/24/2021 0856   HGBUR NEGATIVE 04/24/2021 0856   BILIRUBINUR NEGATIVE 04/24/2021 0856   KETONESUR NEGATIVE 04/24/2021 0856   PROTEINUR 30 (A) 04/24/2021 0856   NITRITE POSITIVE (A) 04/24/2021 0856   LEUKOCYTESUR SMALL (A) 04/24/2021 0856    ----------------------------------------------------------------------------------------------------------------   Imaging Results:    DG Chest Port 1 View  Result Date: 04/24/2021 CLINICAL DATA:  88 year old female with possible sepsis. Productive cough since December. EXAM: PORTABLE CHEST 1 VIEW COMPARISON:  Chest x-ray 03/25/2021. FINDINGS: There is cephalization of the pulmonary vasculature and slight indistinctness of the interstitial markings suggestive of mild pulmonary edema. Small bilateral pleural effusions mild cardiomegaly. Upper mediastinal contours are within normal limits. Atherosclerotic calcifications in the thoracic aorta. Left-sided pacemaker device in place with lead tips projecting over the expected location of the right atrium and right ventricle. IMPRESSION: 1. The appearance the chest suggests mild congestive heart failure, as above. 2. Aortic atherosclerosis. Electronically Signed   By: Vinnie Langton M.D.   On: 04/24/2021 06:28   ECHOCARDIOGRAM COMPLETE  Result Date: 04/24/2021    ECHOCARDIOGRAM REPORT   Patient Name:   TEONA VARGUS Date of Exam: 04/24/2021 Medical Rec #:  [id number]     Height:       63.0 in Accession #:    [id number]    Weight:       115.0 lb Date of Birth:  30-May-1924      BSA:          1.528 m Patient Age:    32 years      BP:           137/64 mmHg Patient Gender: F  HR:           78  bpm. Exam Location:  [facility] Procedure: 2D Echo, Cardiac Doppler and Color Doppler Indications:    CHF-Acute Diastolic P82.42  History:        Patient has prior history of Echocardiogram examinations, most                 recent 12/30/2014. CHF and Cardiomyopathy, CAD, Arrythmias:Atrial                 Fibrillation, Signs/Symptoms:Dyspnea and Shortness of Breath;                 Risk Factors:Hypertension.  Sonographer:    Bernadene Person RDCS Referring Phys: [place]  1. Left ventricular ejection fraction, by estimation, is 20 to 25%. The left ventricle has severely decreased function. The left ventricle demonstrates global hypokinesis. Left ventricular diastolic parameters are consistent with Grade I diastolic dysfunction (impaired relaxation).  2. Right ventricular systolic function is normal. The right ventricular size is normal. There is normal pulmonary artery systolic pressure.  3. Left atrial size was mildly dilated.  4. A small pericardial effusion is present. The pericardial effusion is circumferential.  5. The mitral valve is abnormal. Mild mitral valve regurgitation. No evidence of mitral stenosis.  6. The aortic valve was not well visualized. There is moderate calcification of the aortic valve. There is moderate thickening of the aortic valve. Aortic valve regurgitation is not visualized. No aortic stenosis is present.  7. The inferior vena cava is dilated in size with <50% respiratory variability, suggesting right atrial pressure of 15 mmHg. FINDINGS  Left Ventricle: Global hypokinesis. The anteroseptal, anterior, anterolateral walls are more severely hypokinetic. The apex is akinetic. Left ventricular ejection fraction, by estimation, is 20 to 25%. The left ventricle has severely decreased function.  The left ventricle demonstrates global hypokinesis. The left ventricular internal cavity size was normal in size. There is no left ventricular hypertrophy. Left ventricular  diastolic parameters are consistent with Grade I diastolic dysfunction (impaired  relaxation). Indeterminate filling pressures. Right Ventricle: The right ventricular size is normal. Right vetricular wall thickness was not well visualized. Right ventricular systolic function is normal. There is normal pulmonary artery systolic pressure. The tricuspid regurgitant velocity is 2.21 m/s, and with an assumed right atrial pressure of 8 mmHg, the estimated right ventricular systolic pressure is 35.3 mmHg. Left Atrium: Left atrial size was mildly dilated. Right Atrium: Right atrial size was not well visualized. Pericardium: A small pericardial effusion is present. The pericardial effusion is circumferential. Mitral Valve: The mitral valve is abnormal. There is mild thickening of the mitral valve leaflet(s). There is mild calcification of the mitral valve leaflet(s). Mild mitral annular calcification. Mild mitral valve regurgitation. No evidence of mitral valve stenosis. Tricuspid Valve: The tricuspid valve is not well visualized. Tricuspid valve regurgitation is mild . No evidence of tricuspid stenosis. Aortic Valve: The aortic valve was not well visualized. There is moderate calcification of the aortic valve. There is moderate thickening of the aortic valve. There is moderate aortic valve annular calcification. Aortic valve regurgitation is not visualized. No aortic stenosis is present. Aortic valve mean gradient measures 3.5 mmHg. Aortic valve peak gradient measures 8.7 mmHg. Aortic valve area, by VTI measures 2.36 cm. Pulmonic Valve: The pulmonic valve was not well visualized. Pulmonic valve regurgitation is not visualized. No evidence of pulmonic stenosis. Aorta: The aortic root is normal in size and structure. Venous: The inferior vena  cava is dilated in size with less than 50% respiratory variability, suggesting right atrial pressure of 15 mmHg. IAS/Shunts: The interatrial septum was not well visualized.  LEFT  VENTRICLE PLAX 2D LVIDd:         5.70 cm      Diastology LVIDs:         4.40 cm      LV e' medial:    2.48 cm/s LV PW:         1.00 cm      LV E/e' medial:  18.7 LV IVS:        0.90 cm      LV e' lateral:   4.45 cm/s LVOT diam:     2.00 cm      LV E/e' lateral: 10.4 LV SV:         49 LV SV Index:   32 LVOT Area:     3.14 cm  LV Volumes (MOD) LV vol d, MOD A2C: 136.0 ml LV vol d, MOD A4C: 122.0 ml LV vol s, MOD A2C: 102.0 ml LV vol s, MOD A4C: 88.3 ml LV SV MOD A2C:     34.0 ml LV SV MOD A4C:     122.0 ml LV SV MOD BP:      34.2 ml RIGHT VENTRICLE RV S prime:     10.10 cm/s TAPSE (M-mode): 1.7 cm LEFT ATRIUM             Index        RIGHT ATRIUM          Index LA diam:        3.60 cm 2.36 cm/m   RA Area:     9.29 cm LA Vol (A2C):   51.9 ml 33.96 ml/m  RA Volume:   21.10 ml 13.81 ml/m LA Vol (A4C):   48.1 ml 31.47 ml/m LA Biplane Vol: 53.0 ml 34.68 ml/m  AORTIC VALVE AV Area (Vmax):    1.80 cm AV Area (Vmean):   1.96 cm AV Area (VTI):     2.36 cm AV Vmax:           147.21 cm/s AV Vmean:          83.392 cm/s AV VTI:            0.206 m AV Peak Grad:      8.7 mmHg AV Mean Grad:      3.5 mmHg LVOT Vmax:         84.43 cm/s LVOT Vmean:        51.967 cm/s LVOT VTI:          0.155 m LVOT/AV VTI ratio: 0.75  AORTA Ao Root diam: 3.40 cm Ao Asc diam:  2.70 cm MITRAL VALVE                  TRICUSPID VALVE MV Area (PHT): 3.48 cm       TR Peak grad:   19.5 mmHg MV Decel Time: 218 msec       TR Vmax:        221.00 cm/s MR Peak grad:    113.6 mmHg MR Mean grad:    53.0 mmHg    SHUNTS MR Vmax:         533.00 cm/s  Systemic VTI:  0.16 m MR Vmean:        321.0 cm/s   Systemic Diam: 2.00 cm MR PISA:         0.57 cm MR  PISA Eff ROA: 4 mm MR PISA Radius:  0.30 cm MV E velocity: 46.30 cm/s MV A velocity: 106.00 cm/s MV E/A ratio:  0.44 Carlyle Dolly MD Electronically signed by Carlyle Dolly MD Signature Date/Time: 04/24/2021/2:06:46 PM    Final     Radiological Exams on Admission: DG Chest Port 1 View  Result Date:  04/24/2021 CLINICAL DATA:  88 year old female with possible sepsis. Productive cough since December. EXAM: PORTABLE CHEST 1 VIEW COMPARISON:  Chest x-ray 03/25/2021. FINDINGS: There is cephalization of the pulmonary vasculature and slight indistinctness of the interstitial markings suggestive of mild pulmonary edema. Small bilateral pleural effusions mild cardiomegaly. Upper mediastinal contours are within normal limits. Atherosclerotic calcifications in the thoracic aorta. Left-sided pacemaker device in place with lead tips projecting over the expected location of the right atrium and right ventricle. IMPRESSION: 1. The appearance the chest suggests mild congestive heart failure, as above. 2. Aortic atherosclerosis. Electronically Signed   By: Vinnie Langton M.D.   On: 04/24/2021 06:28   ECHOCARDIOGRAM COMPLETE  Result Date: 04/24/2021    ECHOCARDIOGRAM REPORT   Patient Name:   ELIZA GREEN Date of Exam: 04/24/2021 Medical Rec #:  [id number]     Height:       63.0 in Accession #:    [id number]    Weight:       115.0 lb Date of Birth:  05-14-1924      BSA:          1.528 m Patient Age:    62 years      BP:           137/64 mmHg Patient Gender: F             HR:           78 bpm. Exam Location:  [facility] Procedure: 2D Echo, Cardiac Doppler and Color Doppler Indications:    CHF-Acute Diastolic H47.42  History:        Patient has prior history of Echocardiogram examinations, most                 recent 12/30/2014. CHF and Cardiomyopathy, CAD, Arrythmias:Atrial                 Fibrillation, Signs/Symptoms:Dyspnea and Shortness of Breath;                 Risk Factors:Hypertension.  Sonographer:    Bernadene Person RDCS Referring Phys: [place]  1. Left ventricular ejection fraction, by estimation, is 20 to 25%. The left ventricle has severely decreased function. The left ventricle demonstrates global hypokinesis. Left ventricular diastolic parameters are consistent with Grade I diastolic  dysfunction (impaired relaxation).  2. Right ventricular systolic function is normal. The right ventricular size is normal. There is normal pulmonary artery systolic pressure.  3. Left atrial size was mildly dilated.  4. A small pericardial effusion is present. The pericardial effusion is circumferential.  5. The mitral valve is abnormal. Mild mitral valve regurgitation. No evidence of mitral stenosis.  6. The aortic valve was not well visualized. There is moderate calcification of the aortic valve. There is moderate thickening of the aortic valve. Aortic valve regurgitation is not visualized. No aortic stenosis is present.  7. The inferior vena cava is dilated in size with <50% respiratory variability, suggesting right atrial pressure of 15 mmHg. FINDINGS  Left Ventricle: Global hypokinesis. The anteroseptal, anterior, anterolateral walls are more severely hypokinetic. The apex is akinetic. Left ventricular ejection fraction,  by estimation, is 20 to 25%. The left ventricle has severely decreased function.  The left ventricle demonstrates global hypokinesis. The left ventricular internal cavity size was normal in size. There is no left ventricular hypertrophy. Left ventricular diastolic parameters are consistent with Grade I diastolic dysfunction (impaired  relaxation). Indeterminate filling pressures. Right Ventricle: The right ventricular size is normal. Right vetricular wall thickness was not well visualized. Right ventricular systolic function is normal. There is normal pulmonary artery systolic pressure. The tricuspid regurgitant velocity is 2.21 m/s, and with an assumed right atrial pressure of 8 mmHg, the estimated right ventricular systolic pressure is 79.0 mmHg. Left Atrium: Left atrial size was mildly dilated. Right Atrium: Right atrial size was not well visualized. Pericardium: A small pericardial effusion is present. The pericardial effusion is circumferential. Mitral Valve: The mitral valve is abnormal.  There is mild thickening of the mitral valve leaflet(s). There is mild calcification of the mitral valve leaflet(s). Mild mitral annular calcification. Mild mitral valve regurgitation. No evidence of mitral valve stenosis. Tricuspid Valve: The tricuspid valve is not well visualized. Tricuspid valve regurgitation is mild . No evidence of tricuspid stenosis. Aortic Valve: The aortic valve was not well visualized. There is moderate calcification of the aortic valve. There is moderate thickening of the aortic valve. There is moderate aortic valve annular calcification. Aortic valve regurgitation is not visualized. No aortic stenosis is present. Aortic valve mean gradient measures 3.5 mmHg. Aortic valve peak gradient measures 8.7 mmHg. Aortic valve area, by VTI measures 2.36 cm. Pulmonic Valve: The pulmonic valve was not well visualized. Pulmonic valve regurgitation is not visualized. No evidence of pulmonic stenosis. Aorta: The aortic root is normal in size and structure. Venous: The inferior vena cava is dilated in size with less than 50% respiratory variability, suggesting right atrial pressure of 15 mmHg. IAS/Shunts: The interatrial septum was not well visualized.  LEFT VENTRICLE PLAX 2D LVIDd:         5.70 cm      Diastology LVIDs:         4.40 cm      LV e' medial:    2.48 cm/s LV PW:         1.00 cm      LV E/e' medial:  18.7 LV IVS:        0.90 cm      LV e' lateral:   4.45 cm/s LVOT diam:     2.00 cm      LV E/e' lateral: 10.4 LV SV:         49 LV SV Index:   32 LVOT Area:     3.14 cm  LV Volumes (MOD) LV vol d, MOD A2C: 136.0 ml LV vol d, MOD A4C: 122.0 ml LV vol s, MOD A2C: 102.0 ml LV vol s, MOD A4C: 88.3 ml LV SV MOD A2C:     34.0 ml LV SV MOD A4C:     122.0 ml LV SV MOD BP:      34.2 ml RIGHT VENTRICLE RV S prime:     10.10 cm/s TAPSE (M-mode): 1.7 cm LEFT ATRIUM             Index        RIGHT ATRIUM          Index LA diam:        3.60 cm 2.36 cm/m   RA Area:     9.29 cm LA Vol (A2C):   51.9 ml 33.96  ml/m  RA Volume:   21.10 ml 13.81 ml/m LA Vol (A4C):   48.1 ml 31.47 ml/m LA Biplane Vol: 53.0 ml 34.68 ml/m  AORTIC VALVE AV Area (Vmax):    1.80 cm AV Area (Vmean):   1.96 cm AV Area (VTI):     2.36 cm AV Vmax:           147.21 cm/s AV Vmean:          83.392 cm/s AV VTI:            0.206 m AV Peak Grad:      8.7 mmHg AV Mean Grad:      3.5 mmHg LVOT Vmax:         84.43 cm/s LVOT Vmean:        51.967 cm/s LVOT VTI:          0.155 m LVOT/AV VTI ratio: 0.75  AORTA Ao Root diam: 3.40 cm Ao Asc diam:  2.70 cm MITRAL VALVE                  TRICUSPID VALVE MV Area (PHT): 3.48 cm       TR Peak grad:   19.5 mmHg MV Decel Time: 218 msec       TR Vmax:        221.00 cm/s MR Peak grad:    113.6 mmHg MR Mean grad:    53.0 mmHg    SHUNTS MR Vmax:         533.00 cm/s  Systemic VTI:  0.16 m MR Vmean:        321.0 cm/s   Systemic Diam: 2.00 cm MR PISA:         0.57 cm MR PISA Eff ROA: 4 mm MR PISA Radius:  0.30 cm MV E velocity: 46.30 cm/s MV A velocity: 106.00 cm/s MV E/A ratio:  0.44 Carlyle Dolly MD Electronically signed by Carlyle Dolly MD Signature Date/Time: 04/24/2021/2:06:46 PM    Final     DVT Prophylaxis -SCD/heparin AM Labs Ordered, also please review Full Orders  Family Communication: Admission, patients condition and plan of care including tests being ordered have been discussed with the patient and Nephew /POA who indicate understanding and agree with the plan   Code Status - Full Code  Likely DC to  home with  [facility] SN[facility]  Condition   stable  Roxan Hockey M.D on 04/24/2021 at 6:09 PM Go to [email or web address] -  for contact info  [facility] - Office  [phone]

## 2021-04-24 NOTE — ED Notes (Signed)
Pt verbalized she wants to be a DNR with attending Dr.

## 2021-04-24 NOTE — ED Notes (Signed)
Echo in progress at this time

## 2021-04-24 NOTE — ED Notes (Signed)
Patients oxygen increased to 5liters. Saturation 98 , patient complain still , "can't breath". BP high after neb. Possible CHF? Coughing up green thick secretions thick. Spoke to Dr Betsey Holiday.

## 2021-04-24 NOTE — ED Notes (Signed)
Verbal order to titrate Nitro drip to 140 not 160

## 2021-04-24 NOTE — Progress Notes (Signed)
°  Echocardiogram 2D Echocardiogram has been performed.  Fidel Levy 04/24/2021, 1:53 PM

## 2021-04-24 NOTE — ED Notes (Signed)
Date and time results received: 04/24/21 0651 (use smartphrase ".now" to insert current time)  Test: lactic acid Critical Value: 2.2  Name of Provider Notified: Frederich Cha, MD

## 2021-04-24 NOTE — ED Provider Notes (Addendum)
[facility] EMERGENCY DEPARTMENT Provider Note   CSN: [id number] Arrival date & time: 04/24/21  0543     History  Chief Complaint  Patient presents with   Shortness of Breath    Cough/PNA x 1 month    GERTURDE KUBA is a 88 y.o. female.  Patient presents to the emergency department for evaluation of cough and shortness of breath.  Patient was diagnosed with pneumonia 1 month ago.  She has been on 2 courses of antibiotics and is now on steroids but has not improved.  Patient has persistent cough.  She reports worsening shortness of breath overnight.  She has had a cough productive of greenish-brown sputum.      Home Medications Prior to Admission medications   Medication Sig Start Date End Date Taking? Authorizing Provider  ALPRAZolam Duanne Moron) 0.5 MG tablet Take 0.5 mg by mouth 3 (three) times daily as needed. 04/10/19   [provider]  amoxicillin-clavulanate (AUGMENTIN) 875-125 MG tablet Take 1 tablet by mouth every 12 (twelve) hours. 03/25/21   Noemi Chapel, MD  apixaban (ELIQUIS) 2.5 MG TABS tablet Take 1 tablet (2.5 mg total) by mouth 2 (two) times daily. 10/16/20   Evans Lance, MD  azithromycin (ZITHROMAX Z-PAK) 250 MG tablet Take 1 tablet (250 mg total) by mouth daily. 500mg  PO day 1, then 250mg  PO days 205 03/25/21   Noemi Chapel, MD  Calcium Carbonate-Vitamin D 600-400 MG-UNIT chew tablet Chew 1 tablet by mouth every Saturday.     [provider]  digoxin (LANOXIN) 0.125 MG tablet Take one tablet on Monday - Friday and None on Saturday or Sunday 06/09/20   Evans Lance, MD  furosemide (LASIX) 20 MG tablet Take 20 mg by mouth 2 (two) times a week.     [provider]  levocetirizine (XYZAL) 5 MG tablet Take 5 mg by mouth every morning.     [provider]  levothyroxine (SYNTHROID, LEVOTHROID) 88 MCG tablet Take 88 mcg by mouth daily before breakfast.  12/06/16   [provider]  lisinopril (ZESTRIL) 10 MG tablet TK 1 T PO D  07/04/18   [provider]  metoprolol succinate (TOPROL-XL) 50 MG 24 hr tablet Take 50 mg by mouth daily.  11/08/16   [provider]  Omega-3 Fatty Acids (FISH OIL) 1000 MG CAPS Take 1,000 mg by mouth daily.    [provider]  Polyethyl Glycol-Propyl Glycol (SYSTANE OP) Place 1 drop into both eyes 3 (three) times daily.     [provider]  Potassium 99 MG TABS Take 1 tablet by mouth 2 (two) times daily.     [provider]  VENTOLIN HFA 108 (90 BASE) MCG/ACT inhaler Inhale 2 puffs into the lungs every 4 (four) hours as needed for wheezing or shortness of breath.  07/04/14   [provider]  vitamin C (ASCORBIC ACID) 500 MG tablet Take 500 mg by mouth every morning.     [provider]      Allergies    Macrodantin and Sulfa antibiotics    Review of Systems   Review of Systems  Respiratory:  Positive for cough and shortness of breath.    Physical Exam Updated Vital Signs BP (!) 196/101    Pulse (!) 109    Temp 98.9 F (37.2 C) (Oral)    Resp (!) 27    Ht 5\' 3"  (1.6 m)    Wt 47.2 kg    SpO2 96%  BMI 18.42 kg/m  Physical Exam Vitals and nursing note reviewed.  Constitutional:      General: She is not in acute distress.    Appearance: She is well-developed.  HENT:     Head: Normocephalic and atraumatic.  Eyes:     Conjunctiva/sclera: Conjunctivae normal.  Cardiovascular:     Rate and Rhythm: Normal rate and regular rhythm.     Heart sounds: No murmur heard. Pulmonary:     Effort: Tachypnea and accessory muscle usage present. No respiratory distress.     Breath sounds: Examination of the right-lower field reveals wheezing. Examination of the left-lower field reveals wheezing. Decreased breath sounds, wheezing and rhonchi present.  Abdominal:     Palpations: Abdomen is soft.     Tenderness: There is no abdominal tenderness.  Musculoskeletal:        General: No swelling.     Cervical back: Neck supple.  Skin:     General: Skin is warm and dry.     Capillary Refill: Capillary refill takes less than 2 seconds.  Neurological:     Mental Status: She is alert.  Psychiatric:        Mood and Affect: Mood normal.    ED Results / Procedures / Treatments   Labs (all labs ordered are listed, but only abnormal results are displayed) Labs Reviewed  LACTIC ACID, PLASMA - Abnormal; Notable for the following components:      Result Value   Lactic Acid, Venous 2.2 (*)    All other components within normal limits  COMPREHENSIVE METABOLIC PANEL - Abnormal; Notable for the following components:   Glucose, Bld 158 (*)    BUN 32 (*)    Creatinine, Ser 1.12 (*)    AST 108 (*)    ALT 160 (*)    Alkaline Phosphatase 143 (*)    GFR, Estimated 46 (*)    All other components within normal limits  CBC WITH DIFFERENTIAL/PLATELET - Abnormal; Notable for the following components:   WBC 18.4 (*)    Neutro Abs 14.2 (*)    Monocytes Absolute 1.8 (*)    Abs Immature Granulocytes 0.19 (*)    All other components within normal limits  BRAIN NATRIURETIC PEPTIDE - Abnormal; Notable for the following components:   B Natriuretic Peptide 2,877.0 (*)    All other components within normal limits  CULTURE, BLOOD (ROUTINE X 2)  CULTURE, BLOOD (ROUTINE X 2)  URINE CULTURE  RESP PANEL BY RT-PCR (FLU A&B, COVID) ARPGX2  PROTIME-INR  APTT  LACTIC ACID, PLASMA  URINALYSIS, ROUTINE W REFLEX MICROSCOPIC  TROPONIN I (HIGH SENSITIVITY)    EKG EKG Interpretation  Date/Time:  Saturday April 24 2021 06:01:12 EST Ventricular Rate:  95 PR Interval:  46 QRS Duration: 154 QT Interval:  387 QTC Calculation: 487 R Axis:   228 Text Interpretation: Ventricular-paced rhythm No further analysis attempted due to paced rhythm Confirmed by Orpah Greek ([id number]) on 04/24/2021 6:08:27 AM  Radiology DG Chest Port 1 View  Result Date: 04/24/2021 CLINICAL DATA:  88 year old female with possible sepsis. Productive cough since  December. EXAM: PORTABLE CHEST 1 VIEW COMPARISON:  Chest x-ray 03/25/2021. FINDINGS: There is cephalization of the pulmonary vasculature and slight indistinctness of the interstitial markings suggestive of mild pulmonary edema. Small bilateral pleural effusions mild cardiomegaly. Upper mediastinal contours are within normal limits. Atherosclerotic calcifications in the thoracic aorta. Left-sided pacemaker device in place with lead tips projecting over the expected location of the right atrium and right ventricle.  IMPRESSION: 1. The appearance the chest suggests mild congestive heart failure, as above. 2. Aortic atherosclerosis. Electronically Signed   By: Vinnie Langton M.D.   On: 04/24/2021 06:28    Procedures .Critical Care Performed by: Orpah Greek, MD Authorized by: Orpah Greek, MD   Critical care provider statement:    Critical care time (minutes):  30   Critical care was time spent personally by me on the following activities:  Development of treatment plan with patient or surrogate, discussions with consultants, evaluation of patient's response to treatment, examination of patient, ordering and review of laboratory studies, ordering and review of radiographic studies, ordering and performing treatments and interventions, pulse oximetry, re-evaluation of patient's condition and review of old charts    Medications Ordered in ED Medications  furosemide (LASIX) injection 80 mg (has no administration in time range)  nitroGLYCERIN 50 mg in dextrose 5 % 250 mL (0.2 mg/mL) infusion (has no administration in time range)  ipratropium-albuterol (DUONEB) 0.5-2.5 (3) MG/3ML nebulizer solution 3 mL (3 mLs Nebulization Given 04/24/21 0620)  albuterol (PROVENTIL) (2.5 MG/3ML) 0.083% nebulizer solution 2.5 mg (2.5 mg Nebulization Given 04/24/21 2248)  methylPREDNISolone sodium succinate (SOLU-MEDROL) 125 mg/2 mL injection 80 mg (80 mg Intravenous Given 04/24/21 0627)  nitroGLYCERIN  (NITROSTAT) SL tablet 0.4 mg (0.4 mg Sublingual Given 04/24/21 2500)    ED Course/ Medical Decision Making/ A&P                           Medical Decision Making Amount and/or Complexity of Data Reviewed Labs: ordered. Radiology: ordered. ECG/medicine tests: ordered.  Risk Prescription drug management.   88 year old female with history of hypertension and pacemaker presents to the emergency department for difficulty breathing.  Patient has been experiencing shortness of breath for a month.  She was treated with 2 different antibiotics by primary care for pneumonia but has not improved.  Differential diagnosis would be COVID, pneumonia, COPD, bronchitis, CHF.  Chest x-ray performed at arrival suggestive of volume overload.  Images independently reviewed by me.  Review of her records reveals that she has not had an echo since 2014, but at that time her ejection fraction was 50 to 55% and she had grade 1 diastolic heart failure.  Full work-up initiated.  Patient is hypoxic on arrival with room air oxygen saturations of 88% at rest.  She does not use oxygen at home.  She has improved with supplemental oxygen.  Cardiac evaluation, possible pneumonia and sepsis evaluation underway.  Although she does have a cough with green sputum production, I do not feel that this is the cause of her respiratory symptoms.  With chest x-ray suggestive of congestive heart failure and a BNP of 2877, hypertensive urgency with congestive heart failure is the likely diagnosis.  Initiate diuresis with Lasix, IV nitroglycerin.  Patient will require hospitalization.        Final Clinical Impression(s) / ED Diagnoses Final diagnoses:  Hypoxia  Hypertensive urgency  Congestive heart failure, unspecified HF chronicity, unspecified heart failure type [facility])    Rx / DC Orders ED Discharge Orders     None         Imanol Bihl, Gwenyth Allegra, MD 04/24/21 3704    Orpah Greek, MD 04/24/21 0700

## 2021-04-24 NOTE — ED Notes (Addendum)
Pt took PO meds without any issues. Pt verbalized she had a bm yesterday.

## 2021-04-25 DIAGNOSIS — I5043 Acute on chronic combined systolic (congestive) and diastolic (congestive) heart failure: Secondary | ICD-10-CM | POA: Diagnosis not present

## 2021-04-25 LAB — GLUCOSE, CAPILLARY
Glucose-Capillary: 113 mg/dL — ABNORMAL HIGH (ref 70–99)
Glucose-Capillary: 113 mg/dL — ABNORMAL HIGH (ref 70–99)
Glucose-Capillary: 119 mg/dL — ABNORMAL HIGH (ref 70–99)
Glucose-Capillary: 132 mg/dL — ABNORMAL HIGH (ref 70–99)

## 2021-04-25 LAB — CBC
HCT: 32.4 % — ABNORMAL LOW (ref 36.0–46.0)
Hemoglobin: 10.4 g/dL — ABNORMAL LOW (ref 12.0–15.0)
MCH: 30.3 pg (ref 26.0–34.0)
MCHC: 32.1 g/dL (ref 30.0–36.0)
MCV: 94.5 fL (ref 80.0–100.0)
Platelets: 168 10*3/uL (ref 150–400)
RBC: 3.43 MIL/uL — ABNORMAL LOW (ref 3.87–5.11)
RDW: 14.8 % (ref 11.5–15.5)
WBC: 15.7 10*3/uL — ABNORMAL HIGH (ref 4.0–10.5)
nRBC: 0 % (ref 0.0–0.2)

## 2021-04-25 LAB — BASIC METABOLIC PANEL
Anion gap: 8 (ref 5–15)
BUN: 37 mg/dL — ABNORMAL HIGH (ref 8–23)
CO2: 31 mmol/L (ref 22–32)
Calcium: 8.6 mg/dL — ABNORMAL LOW (ref 8.9–10.3)
Chloride: 97 mmol/L — ABNORMAL LOW (ref 98–111)
Creatinine, Ser: 1.15 mg/dL — ABNORMAL HIGH (ref 0.44–1.00)
GFR, Estimated: 44 mL/min — ABNORMAL LOW (ref >60)
Glucose, Bld: 121 mg/dL — ABNORMAL HIGH (ref 70–99)
Potassium: 3.7 mmol/L (ref 3.5–5.1)
Sodium: 136 mmol/L (ref 135–145)

## 2021-04-25 MED ORDER — FUROSEMIDE 10 MG/ML IJ SOLN
20.0000 mg | Freq: Every day | INTRAMUSCULAR | Status: DC
  Administered 2021-04-26 – 2021-04-28 (×3): 20 mg via INTRAVENOUS
  Filled 2021-04-25 (×3): qty 2

## 2021-04-25 MED ORDER — PROCHLORPERAZINE 25 MG RE SUPP
25.0000 mg | Freq: Once | RECTAL | Status: AC
  Administered 2021-04-25: 25 mg via RECTAL
  Filled 2021-04-25 (×2): qty 1

## 2021-04-25 MED ORDER — HYDRALAZINE HCL 25 MG PO TABS
50.0000 mg | ORAL_TABLET | Freq: Three times a day (TID) | ORAL | Status: DC
  Filled 2021-04-25 (×2): qty 2

## 2021-04-25 MED ORDER — HYDRALAZINE HCL 25 MG PO TABS
50.0000 mg | ORAL_TABLET | Freq: Three times a day (TID) | ORAL | Status: DC
  Administered 2021-04-25: 50 mg via ORAL
  Filled 2021-04-25: qty 2

## 2021-04-25 MED ORDER — LISINOPRIL 10 MG PO TABS
10.0000 mg | ORAL_TABLET | Freq: Every day | ORAL | Status: DC
  Administered 2021-04-25 – 2021-04-27 (×3): 10 mg via ORAL
  Filled 2021-04-25 (×4): qty 1

## 2021-04-25 MED ORDER — ISOSORBIDE MONONITRATE ER 60 MG PO TB24
60.0000 mg | ORAL_TABLET | Freq: Every day | ORAL | Status: DC
  Administered 2021-04-25: 60 mg via ORAL
  Filled 2021-04-25: qty 1

## 2021-04-25 MED ORDER — ISOSORBIDE MONONITRATE ER 60 MG PO TB24
30.0000 mg | ORAL_TABLET | Freq: Every day | ORAL | Status: DC
  Administered 2021-04-27 – 2021-04-28 (×2): 30 mg via ORAL
  Filled 2021-04-25 (×3): qty 1

## 2021-04-25 MED ORDER — CARVEDILOL 12.5 MG PO TABS
12.5000 mg | ORAL_TABLET | Freq: Two times a day (BID) | ORAL | Status: DC
  Administered 2021-04-25 – 2021-04-26 (×2): 12.5 mg via ORAL
  Filled 2021-04-25 (×3): qty 1

## 2021-04-25 NOTE — Progress Notes (Signed)
°   04/25/21 1445 04/25/21 1500 04/25/21 1515  Vitals  BP (!) 116/39 (!) 101/31 (Nitro gtt stopped) (!) 109/35  MAP (mmHg) (!) 64 (!) 55 (!) 57    04/25/21 1530  Vitals  BP (!) 112/32  MAP (mmHg) (!) 60   Patient placed on bedpan, shortly after pt rang call bell and stated "I don't feel right". This RN to room. Pt complaining of nausea, diaphoresis, and feeling like she may pass out. Pt admitted trying to strain with BM. RN removed pt from bedpan and encouraged her to take deep breaths and relax. Blood pressure previously trending down as expected from PO medications administered (See eMAR). BP continued to drop below normal parameters. Nitroglycerin gtt stopped. Dr. Joesph Fillers notified. No new orders at this time. Will notify Dr. Joesph Fillers if BP remains low for new orders.

## 2021-04-25 NOTE — Progress Notes (Signed)
PROGRESS NOTE     [name], is a 88 y.o. female, DOB - [birth year], PIR:[id number]  Admit date - 04/24/2021   Admitting Physician Johnhenry Tippin Denton Brick, MD  Outpatient Primary MD for the patient is Sharilyn Sites, MD  LOS - 1  Chief Complaint  Patient presents with   Shortness of Breath    Cough/PNA x 1 month        Brief Narrative:  88 y.o. female with h/o HTN, PAFib/SSS, s/p PPM , CAD, chronic combined systolic and diastolic dysfunction CHF and hearing loss admitted with Acute on chronic combined systolic and diastolic dysfunction CHF exacerbation resulting in hypoxic respiratory failure-on 04/24/2021   Assessment & Plan:   Principal Problem:   Acute on chronic combined systolic and diastolic CHF (congestive heart failure) ([place]) Active Problems:   Hypertensive emergency   Cardiac pacemaker in situ   1)Acute on chronic combined systolic and diastolic dysfunction CHF exacerbation resulting in hypoxic respiratory failure- --Chest x-ray  suggestive of congestive heart failure - initial troponin at 33, repeat 39 -BNP 2877 -Echo with EF of 20 to 25% with global hypokinesis, and grade 1 diastolic dysfunction -c/n IV Lasix as ordered, = Fluid input and output monitoring and Daily weights Dyspnea and Hypoxia improving slowly, voiding well   2)Social/Ethics-- While in ED RN Lyn Henri at bedside and ED paramedic at bedside, patient requesting DNR/DNI status, patient verbalizes understanding of DNR status and implications -Please see separate documentation from ED RN Lyn Henri -Advanced directives and plan of care discussed with patient's nephew/POA Dr Corey Skains---  -Patient and HCPOA request DNR/DNI status -PTA patient lives alone, she still drives locally in town   3) acute hypoxic respiratory failure secondary to #1 above--- continue supplemental oxygen  -See #1 above  4)Lactic Acidosis-lactic acid is 2.2, suspect this is secondary to hypoxia rather than frank sepsis -Repeat  lactic acid 1.7   5) leukocytosis--- may be partly due to recent repeated doses of steroids -WBC down to 15.7 from 18.4 on admission -Treat presumed UTI as below   6) possible UTI--IV Rocephin pending culture data   7) PAFib/SSS, s/p PPM -----Dig level is low therapeutic at 0.9, continue digoxin  -Continue Eliquis for stroke prophylaxis  8) hypertensive emergency--- resulting in congestive heart failure with hypoxia ---BP improved on IV nitro drip -04/25/21-Start oral isosorbide, hydralazine, Coreg, lisinopril and wean off IV nitro drip   Disposition/Need for in-Hospital Stay- patient unable to be discharged at this time due to acute emergency requiring IV nitro drip, CHF exacerbation requiring IV diuresis   Status is: Inpatient   Remains inpatient appropriate because:    Dispo: The patient is from: Home              Anticipated d/c is to:  Home with home health versus SNF rehab              Anticipated d/c date is: 2 days              Patient currently is not medically stable to d/c. Barriers: Not Clinically Stable-     Code Status :  -  Code Status: DNR   Family Communication:    (patient is alert, awake and coherent)  discussed with patient's nephew/POA Dr Corey Skains---  Consults  :    DVT Prophylaxis  :   - SCDs   apixaban (ELIQUIS) tablet 2.5 mg Start: 04/24/21 1000 SCDs Start: 04/24/21 0844 Place TED hose Start: 04/24/21 0844 apixaban (ELIQUIS) tablet 2.5 mg  Lab Results  Component Value Date   PLT 168 04/25/2021    Inpatient Medications  Scheduled Meds:  apixaban  2.5 mg Oral BID   carvedilol  12.5 mg Oral BID WC   Chlorhexidine Gluconate Cloth  6 each Topical Daily   dextromethorphan-guaiFENesin  1 tablet Oral BID   [START ON 04/26/2021] digoxin  0.125 mg Oral Once per day on Mon Tue Wed Thu Fri   [START ON 04/26/2021] furosemide  20 mg Intravenous Daily   [START ON 04/26/2021] hydrALAZINE  50 mg Oral TID   [START ON 04/26/2021] isosorbide mononitrate  30  mg Oral Daily   levothyroxine  88 mcg Oral QAC breakfast   lisinopril  10 mg Oral Daily   loratadine  10 mg Oral q morning   polyvinyl alcohol  1 drop Both Eyes TID   sodium chloride flush  3 mL Intravenous Q12H   sodium chloride flush  3 mL Intravenous Q12H   Continuous Infusions:  sodium chloride     cefTRIAXone (ROCEPHIN)  IV Stopped (04/24/21 1923)   PRN Meds:.sodium chloride, acetaminophen **OR** acetaminophen, albuterol, ALPRAZolam, bisacodyl, hydrALAZINE, ondansetron **OR** ondansetron (ZOFRAN) IV, polyethylene glycol, sodium chloride flush, traZODone   Anti-infectives (From admission, onward)    Start     Dose/Rate Route Frequency Ordered Stop   04/24/21 1830  cefTRIAXone (ROCEPHIN) 1 g in sodium chloride 0.9 % 100 mL IVPB        1 g 200 mL/hr over 30 Minutes Intravenous Every 24 hours 04/24/21 1731          Subjective: [name] today has no fevers, no emesis,  No chest pain,   Dyspnea and cough improving slowly -Hypoxia persist -Urine output good   Objective: Vitals:   04/25/21 1530 04/25/21 1600 04/25/21 1615 04/25/21 1630  BP: (!) 112/32 (!) 110/34 (!) 118/36 (!) 117/32  Pulse: 70 69 70 70  Resp: 16 15 18 12   Temp:      TempSrc:      SpO2: 96% 97% 97% 96%  Weight:      Height:        Intake/Output Summary (Last 24 hours) at 04/25/2021 1654 Last data filed at 04/25/2021 1459 Gross per 24 hour  Intake 736.92 ml  Output 750 ml  Net -13.08 ml   Filed Weights   04/24/21 0749 04/24/21 1501 04/25/21 0500  Weight: 52.2 kg 46.6 kg 46.8 kg    Physical Exam  Gen:- Awake Alert, in no acute distress, HEENT:- [place].AT, No sclera icterus Ears- HOH Nose- [place] 2L/min Neck-Supple Neck,No JVD,.  Lungs-diminished in bases with faint bibasilar rales  CV- S1, S2 normal, regular , pacemaker in situ Abd-  +ve B.Sounds, Abd Soft, No tenderness,    Extremity/Skin:-Improved edema, pedal pulses present  Psych-affect is appropriate, oriented x3 Neuro-generalized  weakness, no new focal deficits, no tremors  Data Reviewed: I have personally reviewed following labs and imaging studies  CBC: Recent Labs  Lab 04/24/21 0611 04/25/21 0508  WBC 18.4* 15.7*  NEUTROABS 14.2*  --   HGB 13.0 10.4*  HCT 42.3 32.4*  MCV 97.0 94.5  PLT 190 283   Basic Metabolic Panel: Recent Labs  Lab 04/24/21 0611 04/25/21 0508  NA 137 136  K 4.8 3.7  CL 103 97*  CO2 27 31  GLUCOSE 158* 121*  BUN 32* 37*  CREATININE 1.12* 1.15*  CALCIUM 9.3 8.6*   GFR: Estimated Creatinine Clearance: 22.1 mL/min (A) (by C-G formula based on SCr of 1.15 mg/dL (H)).  Liver Function Tests: Recent Labs  Lab 04/24/21 0611  AST 108*  ALT 160*  ALKPHOS 143*  BILITOT 0.9  PROT 6.8  ALBUMIN 3.6   No results for input(s): LIPASE, AMYLASE in the last 168 hours. No results for input(s): AMMONIA in the last 168 hours. Coagulation Profile: Recent Labs  Lab 04/24/21 0611  INR 1.2   Cardiac Enzymes: No results for input(s): CKTOTAL, CKMB, CKMBINDEX, TROPONINI in the last 168 hours. BNP (last 3 results) No results for input(s): PROBNP in the last 8760 hours. HbA1C: No results for input(s): HGBA1C in the last 72 hours. CBG: Recent Labs  Lab 04/24/21 1855 04/25/21 0008 04/25/21 0558 04/25/21 1132  GLUCAP 149* 119* 113* 113*   Lipid Profile: No results for input(s): CHOL, HDL, LDLCALC, TRIG, CHOLHDL, LDLDIRECT in the last 72 hours. Thyroid Function Tests: No results for input(s): TSH, T4TOTAL, FREET4, T3FREE, THYROIDAB in the last 72 hours. Anemia Panel: No results for input(s): VITAMINB12, FOLATE, FERRITIN, TIBC, IRON, RETICCTPCT in the last 72 hours. Urine analysis:    Component Value Date/Time   COLORURINE YELLOW 04/24/2021 0856   APPEARANCEUR CLEAR 04/24/2021 0856   LABSPEC 1.020 04/24/2021 0856   PHURINE 6.0 04/24/2021 0856   GLUCOSEU NEGATIVE 04/24/2021 0856   HGBUR NEGATIVE 04/24/2021 0856   BILIRUBINUR NEGATIVE 04/24/2021 0856   KETONESUR NEGATIVE  04/24/2021 0856   PROTEINUR 30 (A) 04/24/2021 0856   NITRITE POSITIVE (A) 04/24/2021 0856   LEUKOCYTESUR SMALL (A) 04/24/2021 0856   Sepsis Labs: @LABRCNTIP (procalcitonin:4,lacticidven:4)  ) Recent Results (from the past 240 hour(s))  Blood Culture (routine x 2)     Status: None (Preliminary result)   Collection Time: 04/24/21  6:12 AM   Specimen: BLOOD RIGHT ARM  Result Value Ref Range Status   Specimen Description   Final    BLOOD RIGHT ARM BOTTLES DRAWN AEROBIC AND ANAEROBIC   Special Requests Blood Culture adequate volume  Final   Culture   Final    NO GROWTH 1 DAY Performed at [facility], [address], [place], [place] [address]    Report Status PENDING  Incomplete  Blood Culture (routine x 2)     Status: None (Preliminary result)   Collection Time: 04/24/21  6:15 AM   Specimen: BLOOD RIGHT ARM  Result Value Ref Range Status   Specimen Description   Final    BLOOD RIGHT ARM BOTTLES DRAWN AEROBIC AND ANAEROBIC   Special Requests   Final    Blood Culture results may not be optimal due to an excessive volume of blood received in culture bottles   Culture   Final    NO GROWTH 1 DAY Performed at [facility] [address] [place], [place] [address]    Report Status PENDING  Incomplete  Resp Panel by RT-PCR (Flu A&B, Covid) Nasopharyngeal Swab     Status: None   Collection Time: 04/24/21  7:20 AM   Specimen: Nasopharyngeal Swab; Nasopharyngeal(NP) swabs in vial transport medium  Result Value Ref Range Status   SARS Coronavirus 2 by RT PCR NEGATIVE NEGATIVE Final    Comment: (NOTE) SARS-CoV-2 target nucleic acids are NOT DETECTED.  The SARS-CoV-2 RNA is generally detectable in upper respiratory specimens during the acute phase of infection. The lowest concentration of SARS-CoV-2 viral copies this assay can detect is 138 copies/mL. A negative result does not preclude SARS-Cov-2 infection and should not be used as the sole basis for treatment or other patient  management decisions. A negative result may occur with  improper specimen collection/handling, submission of specimen other than nasopharyngeal swab, presence of viral mutation(s) within the areas targeted by this assay, and inadequate number of viral copies(<138 copies/mL). A negative result must be combined with clinical observations, patient history, and epidemiological information. The expected result is Negative.  Fact Sheet for Patients:  EntrepreneurPulse.com.au  Fact Sheet for Healthcare Providers:  IncredibleEmployment.be  This test is no t yet approved or cleared by the [place] FDA and  has been authorized for detection and/or diagnosis of SARS-CoV-2 by FDA under an Emergency Use Authorization (EUA). This EUA will remain  in effect (meaning this test can be used) for the duration of the COVID-19 declaration under Section 564(b)(1) of the Act, 21 U.S.C.section 360bbb-3(b)(1), unless the authorization is terminated  or revoked sooner.       Influenza A by PCR NEGATIVE NEGATIVE Final   Influenza B by PCR NEGATIVE NEGATIVE Final    Comment: (NOTE) The Xpert Xpress SARS-CoV-2/FLU/RSV plus assay is intended as an aid in the diagnosis of influenza from Nasopharyngeal swab specimens and should not be used as a sole basis for treatment. Nasal washings and aspirates are unacceptable for Xpert Xpress SARS-CoV-2/FLU/RSV testing.  Fact Sheet for Patients: EntrepreneurPulse.com.au  Fact Sheet for Healthcare Providers: IncredibleEmployment.be  This test is not yet approved or cleared by the [place] FDA and has been authorized for detection and/or diagnosis of SARS-CoV-2 by FDA under an Emergency Use Authorization (EUA). This EUA will remain in effect (meaning this test can be used) for the duration of the COVID-19 declaration under Section 564(b)(1) of the Act, 21 U.S.C. section 360bbb-3(b)(1),  unless the authorization is terminated or revoked.  Performed at [facility], [address] [place], [place] [address]   Urine Culture     Status: Abnormal (Preliminary result)   Collection Time: 04/24/21  8:58 AM   Specimen: Urine, Catheterized  Result Value Ref Range Status   Specimen Description   Final    URINE, CATHETERIZED Performed at [facility], [address] [place], [place] [address]    Special Requests   Final    NONE Performed at [facility], [address], [place], [place] [address]    Culture (A)  Final    >=100,000 COLONIES/mL ESCHERICHIA COLI SUSCEPTIBILITIES TO FOLLOW Performed at [facility] [address] [place], [place] [address]    Report Status PENDING  Incomplete  MRSA Next Gen by PCR, Nasal     Status: None   Collection Time: 04/24/21  3:08 PM   Specimen: Nasal Mucosa; Nasal Swab  Result Value Ref Range Status   MRSA by PCR Next Gen NOT DETECTED NOT DETECTED Final    Comment: (NOTE) The GeneXpert MRSA Assay (FDA approved for NASAL specimens only), is one component of a comprehensive MRSA colonization surveillance program. It is not intended to diagnose MRSA infection nor to guide or monitor treatment for MRSA infections. Test performance is not FDA approved in patients less than 44 years old. Performed at [facility], [address] [place], [place] [address]       Radiology Studies: [facility] Chest [place] 1 View  Result Date: 04/24/2021 CLINICAL DATA:  88 year old female with possible sepsis. Productive cough since December. EXAM: PORTABLE CHEST 1 VIEW COMPARISON:  Chest x-ray 03/25/2021. FINDINGS: There is cephalization of the pulmonary vasculature and slight indistinctness of the interstitial markings suggestive of mild pulmonary edema. Small bilateral pleural effusions mild cardiomegaly. Upper mediastinal contours are within normal limits. Atherosclerotic calcifications in the  thoracic aorta. Left-sided pacemaker device in place with lead  tips projecting over the expected location of the right atrium and right ventricle. IMPRESSION: 1. The appearance the chest suggests mild congestive heart failure, as above. 2. Aortic atherosclerosis. Electronically Signed   By: Vinnie Langton M.D.   On: 04/24/2021 06:28   ECHOCARDIOGRAM COMPLETE  Result Date: 04/24/2021    ECHOCARDIOGRAM REPORT   Patient Name:   MYA SUELL Date of Exam: 04/24/2021 Medical Rec #:  [id number]     Height:       63.0 in Accession #:    [id number]    Weight:       115.0 lb Date of Birth:  June 05, 1924      BSA:          1.528 m Patient Age:    76 years      BP:           137/64 mmHg Patient Gender: F             HR:           78 bpm. Exam Location:  [facility] Procedure: 2D Echo, Cardiac Doppler and Color Doppler Indications:    CHF-Acute Diastolic E99.37  History:        Patient has prior history of Echocardiogram examinations, most                 recent 12/30/2014. CHF and Cardiomyopathy, CAD, Arrythmias:Atrial                 Fibrillation, Signs/Symptoms:Dyspnea and Shortness of Breath;                 Risk Factors:Hypertension.  Sonographer:    Bernadene Person RDCS Referring Phys: [place]  1. Left ventricular ejection fraction, by estimation, is 20 to 25%. The left ventricle has severely decreased function. The left ventricle demonstrates global hypokinesis. Left ventricular diastolic parameters are consistent with Grade I diastolic dysfunction (impaired relaxation).  2. Right ventricular systolic function is normal. The right ventricular size is normal. There is normal pulmonary artery systolic pressure.  3. Left atrial size was mildly dilated.  4. A small pericardial effusion is present. The pericardial effusion is circumferential.  5. The mitral valve is abnormal. Mild mitral valve regurgitation. No evidence of mitral stenosis.  6. The aortic valve was not well visualized. There is moderate calcification of the aortic valve. There is moderate  thickening of the aortic valve. Aortic valve regurgitation is not visualized. No aortic stenosis is present.  7. The inferior vena cava is dilated in size with <50% respiratory variability, suggesting right atrial pressure of 15 mmHg. FINDINGS  Left Ventricle: Global hypokinesis. The anteroseptal, anterior, anterolateral walls are more severely hypokinetic. The apex is akinetic. Left ventricular ejection fraction, by estimation, is 20 to 25%. The left ventricle has severely decreased function.  The left ventricle demonstrates global hypokinesis. The left ventricular internal cavity size was normal in size. There is no left ventricular hypertrophy. Left ventricular diastolic parameters are consistent with Grade I diastolic dysfunction (impaired  relaxation). Indeterminate filling pressures. Right Ventricle: The right ventricular size is normal. Right vetricular wall thickness was not well visualized. Right ventricular systolic function is normal. There is normal pulmonary artery systolic pressure. The tricuspid regurgitant velocity is 2.21 m/s, and with an assumed right atrial pressure of 8 mmHg, the estimated right ventricular systolic pressure is 16.9 mmHg. Left Atrium: Left atrial size was mildly dilated. Right Atrium: Right  atrial size was not well visualized. Pericardium: A small pericardial effusion is present. The pericardial effusion is circumferential. Mitral Valve: The mitral valve is abnormal. There is mild thickening of the mitral valve leaflet(s). There is mild calcification of the mitral valve leaflet(s). Mild mitral annular calcification. Mild mitral valve regurgitation. No evidence of mitral valve stenosis. Tricuspid Valve: The tricuspid valve is not well visualized. Tricuspid valve regurgitation is mild . No evidence of tricuspid stenosis. Aortic Valve: The aortic valve was not well visualized. There is moderate calcification of the aortic valve. There is moderate thickening of the aortic valve.  There is moderate aortic valve annular calcification. Aortic valve regurgitation is not visualized. No aortic stenosis is present. Aortic valve mean gradient measures 3.5 mmHg. Aortic valve peak gradient measures 8.7 mmHg. Aortic valve area, by VTI measures 2.36 cm. Pulmonic Valve: The pulmonic valve was not well visualized. Pulmonic valve regurgitation is not visualized. No evidence of pulmonic stenosis. Aorta: The aortic root is normal in size and structure. Venous: The inferior vena cava is dilated in size with less than 50% respiratory variability, suggesting right atrial pressure of 15 mmHg. IAS/Shunts: The interatrial septum was not well visualized.  LEFT VENTRICLE PLAX 2D LVIDd:         5.70 cm      Diastology LVIDs:         4.40 cm      LV e' medial:    2.48 cm/s LV PW:         1.00 cm      LV E/e' medial:  18.7 LV IVS:        0.90 cm      LV e' lateral:   4.45 cm/s LVOT diam:     2.00 cm      LV E/e' lateral: 10.4 LV SV:         49 LV SV Index:   32 LVOT Area:     3.14 cm  LV Volumes (MOD) LV vol d, MOD A2C: 136.0 ml LV vol d, MOD A4C: 122.0 ml LV vol s, MOD A2C: 102.0 ml LV vol s, MOD A4C: 88.3 ml LV SV MOD A2C:     34.0 ml LV SV MOD A4C:     122.0 ml LV SV MOD BP:      34.2 ml RIGHT VENTRICLE RV S prime:     10.10 cm/s TAPSE (M-mode): 1.7 cm LEFT ATRIUM             Index        RIGHT ATRIUM          Index LA diam:        3.60 cm 2.36 cm/m   RA Area:     9.29 cm LA Vol (A2C):   51.9 ml 33.96 ml/m  RA Volume:   21.10 ml 13.81 ml/m LA Vol (A4C):   48.1 ml 31.47 ml/m LA Biplane Vol: 53.0 ml 34.68 ml/m  AORTIC VALVE AV Area (Vmax):    1.80 cm AV Area (Vmean):   1.96 cm AV Area (VTI):     2.36 cm AV Vmax:           147.21 cm/s AV Vmean:          83.392 cm/s AV VTI:            0.206 m AV Peak Grad:      8.7 mmHg AV Mean Grad:      3.5 mmHg LVOT Vmax:  84.43 cm/s LVOT Vmean:        51.967 cm/s LVOT VTI:          0.155 m LVOT/AV VTI ratio: 0.75  AORTA Ao Root diam: 3.40 cm Ao Asc diam:  2.70 cm  MITRAL VALVE                  TRICUSPID VALVE MV Area (PHT): 3.48 cm       TR Peak grad:   19.5 mmHg MV Decel Time: 218 msec       TR Vmax:        221.00 cm/s MR Peak grad:    113.6 mmHg MR Mean grad:    53.0 mmHg    SHUNTS MR Vmax:         533.00 cm/s  Systemic VTI:  0.16 m MR Vmean:        321.0 cm/s   Systemic Diam: 2.00 cm MR PISA:         0.57 cm MR PISA Eff ROA: 4 mm MR PISA Radius:  0.30 cm MV E velocity: 46.30 cm/s MV A velocity: 106.00 cm/s MV E/A ratio:  0.44 Carlyle Dolly MD Electronically signed by Carlyle Dolly MD Signature Date/Time: 04/24/2021/2:06:46 PM    Final      Scheduled Meds:  apixaban  2.5 mg Oral BID   carvedilol  12.5 mg Oral BID WC   Chlorhexidine Gluconate Cloth  6 each Topical Daily   dextromethorphan-guaiFENesin  1 tablet Oral BID   [START ON 04/26/2021] digoxin  0.125 mg Oral Once per day on Mon Tue Wed Thu Fri   [START ON 04/26/2021] furosemide  20 mg Intravenous Daily   [START ON 04/26/2021] hydrALAZINE  50 mg Oral TID   [START ON 04/26/2021] isosorbide mononitrate  30 mg Oral Daily   levothyroxine  88 mcg Oral QAC breakfast   lisinopril  10 mg Oral Daily   loratadine  10 mg Oral q morning   polyvinyl alcohol  1 drop Both Eyes TID   sodium chloride flush  3 mL Intravenous Q12H   sodium chloride flush  3 mL Intravenous Q12H   Continuous Infusions:  sodium chloride     cefTRIAXone (ROCEPHIN)  IV Stopped (04/24/21 1923)     LOS: 1 day    Roxan Hockey M.D on 04/25/2021 at 4:54 PM  Go to [email or web address] - for contact info  [facility] - Office  [phone]  If 7PM-7AM, please contact night-coverage [email or web address] Password [facility] 04/25/2021, 4:54 PM

## 2021-04-25 NOTE — TOC Initial Note (Signed)
Transition of Care [facility]) - Initial/Assessment Note    Patient Details  Name: [name] MRN: [id number] Date of Birth: [birth year]  Transition of Care [facility]) CM/SW Contact:    Iona Beard, [place] Phone Number: 04/25/2021, 12:13 PM  Clinical Narrative:                 [facility] consulted for CHF screen. CSW spoke with pt in room to complete assessment. Pt states that she lives alone. Pt is independent in completing her ADLs but has a friend to assist if needed. Pt states that she drives only in [place] and has transportation provided if needed. Pt has not had [place] services in the past. Pt does not use any DME.   Pt states that she has not been weighing herself everyday. Pt has a scale at home and was educated on importance of daily weights. Pt follows a heart healthy diet. Pt takes all medications as prescribed. TOC to follow.   Expected Discharge Plan: Home/Self Care Barriers to Discharge: Continued Medical Work up   Patient Goals and CMS Choice Patient states their goals for this hospitalization and ongoing recovery are:: Return home CMS Medicare.gov Compare Post Acute Care list provided to:: Patient Choice offered to / list presented to : Patient  Expected Discharge Plan and Services Expected Discharge Plan: Home/Self Care In-house Referral: Clinical Social Work Discharge Planning Services: CM Consult   Living arrangements for the past 2 months: Single Family Home                                      Prior Living Arrangements/Services Living arrangements for the past 2 months: Single Family Home Lives with:: Self Patient language and need for interpreter reviewed:: Yes Do you feel safe going back to the place where you live?: Yes      Need for Family Participation in Patient Care: Yes (Comment) Care giver support system in place?: Yes (comment)   Criminal Activity/Legal Involvement Pertinent to Current Situation/Hospitalization: No - Comment as needed  Activities  of Daily Living Home Assistive Devices/Equipment: Eyeglasses, Hearing aid ADL Screening (condition at time of admission) Patient's cognitive ability adequate to safely complete daily activities?: Yes Is the patient deaf or have difficulty hearing?: No Does the patient have difficulty seeing, even when wearing glasses/contacts?: No Does the patient have difficulty concentrating, remembering, or making decisions?: No Patient able to express need for assistance with ADLs?: Yes Does the patient have difficulty dressing or bathing?: No Independently performs ADLs?: Yes (appropriate for developmental age) Does the patient have difficulty walking or climbing stairs?: No Weakness of Legs: None Weakness of Arms/Hands: None  Permission Sought/Granted                  Emotional Assessment Appearance:: Appears stated age Attitude/Demeanor/Rapport: Engaged Affect (typically observed): Accepting Orientation: : Oriented to Self, Oriented to Place, Oriented to  Time, Oriented to Situation Alcohol / Substance Use: Not Applicable Psych Involvement: No (comment)  Admission diagnosis:  Hypoxia [R09.02] Acute exacerbation of CHF (congestive heart failure) (HCC) [I50.9] Hypertensive urgency [I16.0] Congestive heart failure, unspecified HF chronicity, unspecified heart failure type [facility] [address]) [I50.9] Patient Active Problem List   Diagnosis Date Noted   Acute exacerbation of CHF (congestive heart failure) ([place]) 04/24/2021   Acute on chronic combined systolic and diastolic CHF (congestive heart failure) ([place]) 04/24/2021   Hypertensive emergency 04/24/2021   SOB (shortness of breath)  12/26/2014   Cardiac pacemaker in situ 09/30/2013   Benign essential HTN 09/30/2013   Plantar fasciitis of left foot 06/21/2011   PCP:  Sharilyn Sites, MD Pharmacy:  No Pharmacies Listed    Social Determinants of Health (SDOH) Interventions    Readmission Risk Interventions Readmission Risk Prevention Plan 04/25/2021   Transportation Screening Complete  HRI or [place] Complete  Social Work Consult for [place] Planning/Counseling Complete  Palliative Care Screening Not Applicable  Medication Review [employer]) Complete  Some recent data might be hidden

## 2021-04-25 NOTE — Progress Notes (Signed)
Dr. Joesph Fillers notified of trending blood pressures. See vitals flowsheet. Lasix dose for 1800 discontinued. Will continue to run blood pressures q65min.

## 2021-04-26 ENCOUNTER — Inpatient Hospital Stay (HOSPITAL_COMMUNITY): Payer: Medicare Other

## 2021-04-26 DIAGNOSIS — I5043 Acute on chronic combined systolic (congestive) and diastolic (congestive) heart failure: Secondary | ICD-10-CM | POA: Diagnosis not present

## 2021-04-26 LAB — RENAL FUNCTION PANEL
Albumin: 2.6 g/dL — ABNORMAL LOW (ref 3.5–5.0)
Anion gap: 6 (ref 5–15)
BUN: 37 mg/dL — ABNORMAL HIGH (ref 8–23)
CO2: 32 mmol/L (ref 22–32)
Calcium: 8.1 mg/dL — ABNORMAL LOW (ref 8.9–10.3)
Chloride: 97 mmol/L — ABNORMAL LOW (ref 98–111)
Creatinine, Ser: 1.22 mg/dL — ABNORMAL HIGH (ref 0.44–1.00)
GFR, Estimated: 41 mL/min — ABNORMAL LOW (ref >60)
Glucose, Bld: 104 mg/dL — ABNORMAL HIGH (ref 70–99)
Phosphorus: 3 mg/dL (ref 2.5–4.6)
Potassium: 3.6 mmol/L (ref 3.5–5.1)
Sodium: 135 mmol/L (ref 135–145)

## 2021-04-26 LAB — URINE CULTURE: Culture: >=100000 — AB

## 2021-04-26 LAB — GLUCOSE, CAPILLARY
Glucose-Capillary: 109 mg/dL — ABNORMAL HIGH (ref 70–99)
Glucose-Capillary: 126 mg/dL — ABNORMAL HIGH (ref 70–99)
Glucose-Capillary: 129 mg/dL — ABNORMAL HIGH (ref 70–99)

## 2021-04-26 MED ORDER — HYDRALAZINE HCL 25 MG PO TABS: 25.0000 mg | ORAL_TABLET | Freq: Two times a day (BID) | ORAL | Status: DC

## 2021-04-26 MED ORDER — CARVEDILOL 3.125 MG PO TABS
6.2500 mg | ORAL_TABLET | Freq: Two times a day (BID) | ORAL | Status: DC
  Administered 2021-04-26 – 2021-04-28 (×4): 6.25 mg via ORAL
  Filled 2021-04-26 (×4): qty 2

## 2021-04-26 NOTE — Progress Notes (Signed)
PROGRESS NOTE     [name], is a 88 y.o. female, DOB - 1925/03/11, [id number]:[id number]  Admit date - 04/24/2021   Admitting Physician [name] Coalson Denton Brick, MD  Outpatient Primary MD for the patient is [name] Sites, MD  LOS - 2  Chief Complaint  Patient presents with   Shortness of Breath    Cough/PNA x 1 month        Brief Narrative:  88 y.o. female with h/o HTN, PAFib/SSS, s/p PPM , CAD, chronic combined systolic and diastolic dysfunction CHF and hearing loss admitted with Acute on chronic combined systolic and diastolic dysfunction CHF exacerbation resulting in hypoxic respiratory failure-on 04/24/2021   Assessment & Plan:   Principal Problem:   Acute on chronic combined systolic and diastolic CHF (congestive heart failure) ([place]) Active Problems:   Hypertensive emergency   Cardiac pacemaker in situ   1)Acute on chronic combined systolic and diastolic dysfunction CHF exacerbation resulting in hypoxic respiratory failure- - initial troponin at 33, repeat 39 -BNP 2877 -Echo with EF of 20 to 25% with global hypokinesis, and grade 1 diastolic dysfunction -c/n IV Lasix as ordered, = Fluid input and output monitoring and Daily weights Dyspnea and Hypoxia improving slowly, voiding well -Repeat chest x-ray on 04/26/2021 showed improved CHF   2)Social/Ethics-- While in ED RN [name] at [name] and ED paramedic at [name], patient requesting DNR/DNI status, patient verbalizes understanding of DNR status and implications -Please see separate documentation from ED RN [name] -Advanced directives and plan of care discussed with patient's nephew/POA [name]---  -Patient and HCPOA request DNR/DNI status -PTA patient lives alone, she still drives locally in town   3) acute hypoxic respiratory failure secondary to #1 above--- continue supplemental oxygen  -See #1 above -Patient will most likely need home O2  4)Lactic Acidosis-lactic acid is 2.2, suspect this is secondary to  hypoxia rather than frank sepsis -Repeat lactic acid 1.7   5) leukocytosis--- may be partly due to recent repeated doses of steroids -WBC down to 15.7 from 18.4 on admission -Continue IV Rocephin for E. coli UTI   6) E. coli UTI--IV Rocephin for 1 more day   7) PAFib/SSS, s/p PPM -----Dig level was low therapeutic at 0.9, continue digoxin  -Continue Eliquis for stroke prophylaxis  8) hypertensive emergency--- resulting in congestive heart failure with hypoxia ---BP improved on IV nitro drip, weaned off nitro drip -Coreg adjusted to 6.25 mg twice daily, continue lisinopril, hydralazine and isosorbide adjusted, - 9) generalized weakness and deconditioning--PT eval appreciated recommends home health PT    Disposition/Need for in-Hospital Stay- patient unable to be discharged at this time due to acute  CHF exacerbation requiring IV diuresis   Status is: Inpatient   Remains inpatient appropriate because:    Dispo: The patient is from: Home              Anticipated d/c is to:  Home with home health versus SNF rehab              Anticipated d/c date is: 1 days              Patient currently is not medically stable to d/c. Barriers: Not Clinically Stable-     Code Status :  -  Code Status: DNR   Family Communication:    (patient is alert, awake and coherent)  discussed with patient's nephew/POA [name]---  Consults  :    DVT Prophylaxis  :   - SCDs  apixaban (ELIQUIS) tablet 2.5 mg Start: 04/24/21 1000 SCDs Start: 04/24/21 0844 Place TED hose Start: 04/24/21 0844 apixaban (ELIQUIS) tablet 2.5 mg    Lab Results  Component Value Date   PLT 168 04/25/2021    Inpatient Medications  Scheduled Meds:  apixaban  2.5 mg Oral BID   carvedilol  6.25 mg Oral BID WC   Chlorhexidine Gluconate Cloth  6 each Topical Daily   dextromethorphan-guaiFENesin  1 tablet Oral BID   digoxin  0.125 mg Oral Once per day on Mon Tue Wed Thu Fri   furosemide  20 mg Intravenous Daily    [START ON 04/27/2021] hydrALAZINE  25 mg Oral BID   isosorbide mononitrate  30 mg Oral Daily   levothyroxine  88 mcg Oral QAC breakfast   lisinopril  10 mg Oral Daily   loratadine  10 mg Oral q morning   polyvinyl alcohol  1 drop Both Eyes TID   sodium chloride flush  3 mL Intravenous Q12H   sodium chloride flush  3 mL Intravenous Q12H   Continuous Infusions:  sodium chloride     cefTRIAXone (ROCEPHIN)  IV 1 g (04/26/21 1745)   PRN Meds:.sodium chloride, acetaminophen **OR** acetaminophen, albuterol, ALPRAZolam, bisacodyl, hydrALAZINE, ondansetron **OR** ondansetron (ZOFRAN) IV, polyethylene glycol, sodium chloride flush, traZODone   Anti-infectives (From admission, onward)    Start     Dose/Rate Route Frequency Ordered Stop   04/24/21 1830  cefTRIAXone (ROCEPHIN) 1 g in sodium chloride 0.9 % 100 mL IVPB        1 g 200 mL/hr over 30 Minutes Intravenous Every 24 hours 04/24/21 1731          Subjective: [name] today has no fevers, no emesis,  No chest pain,   Dyspnea and cough improving slowly -Hypoxia persist -Urine output good -Patient's friend [name] at [name], patient's niece [name], patient's nephew [name] is on the phone -Questions answered   Objective: Vitals:   04/26/21 1400 04/26/21 1530 04/26/21 1545 04/26/21 1628  BP: (!) 129/48   (!) 138/53  Pulse: 75 72 72 73  Resp: 17 13 12 15   Temp:    98.9 F (37.2 C)  TempSrc:    Axillary  SpO2: 96% 95% 95% 97%  Weight:      Height:        Intake/Output Summary (Last 24 hours) at 04/26/2021 1908 Last data filed at 04/26/2021 1853 Gross per 24 hour  Intake 63 ml  Output 775 ml  Net -712 ml   Filed Weights   04/24/21 1501 04/25/21 0500 04/26/21 0400  Weight: 46.6 kg 46.8 kg 50 kg    Physical Exam  Gen:- Awake Alert, in no acute distress, HEENT:- [place].AT, No sclera icterus Ears- HOH, uses hearing aids improved Nose- [place] 2L/min Neck-Supple Neck,No JVD,.  Lungs-improving air movement, no  wheezing CV- S1, S2 normal, regular , pacemaker in situ Abd-  +ve B.Sounds, Abd Soft, No tenderness,    Extremity/Skin:-Improved edema, pedal pulses present  Psych-affect is appropriate, oriented x3 Neuro-generalized weakness, no new focal deficits, no tremors  Data Reviewed: I have personally reviewed following labs and imaging studies  CBC: Recent Labs  Lab 04/24/21 0611 04/25/21 0508  WBC 18.4* 15.7*  NEUTROABS 14.2*  --   HGB 13.0 10.4*  HCT 42.3 32.4*  MCV 97.0 94.5  PLT 190 076   Basic Metabolic Panel: Recent Labs  Lab 04/24/21 0611 04/25/21 0508 04/26/21 0259  NA 137 136 135  K 4.8 3.7  3.6  CL 103 97* 97*  CO2 27 31 32  GLUCOSE 158* 121* 104*  BUN 32* 37* 37*  CREATININE 1.12* 1.15* 1.22*  CALCIUM 9.3 8.6* 8.1*  PHOS  --   --  3.0   GFR: Estimated Creatinine Clearance: 22.3 mL/min (A) (by C-G formula based on SCr of 1.22 mg/dL (H)). Liver Function Tests: Recent Labs  Lab 04/24/21 0611 04/26/21 0259  AST 108*  --   ALT 160*  --   ALKPHOS 143*  --   BILITOT 0.9  --   PROT 6.8  --   ALBUMIN 3.6 2.6*   No results for input(s): LIPASE, AMYLASE in the last 168 hours. No results for input(s): AMMONIA in the last 168 hours. Coagulation Profile: Recent Labs  Lab 04/24/21 0611  INR 1.2   Cardiac Enzymes: No results for input(s): CKTOTAL, CKMB, CKMBINDEX, TROPONINI in the last 168 hours. BNP (last 3 results) No results for input(s): PROBNP in the last 8760 hours. HbA1C: No results for input(s): HGBA1C in the last 72 hours. CBG: Recent Labs  Lab 04/25/21 1132 04/25/21 1718 04/25/21 2359 04/26/21 0554 04/26/21 1136  GLUCAP 113* 132* 126* 109* 129*   Lipid Profile: No results for input(s): CHOL, HDL, LDLCALC, TRIG, CHOLHDL, LDLDIRECT in the last 72 hours. Thyroid Function Tests: No results for input(s): TSH, T4TOTAL, FREET4, T3FREE, THYROIDAB in the last 72 hours. Anemia Panel: No results for input(s): VITAMINB12, FOLATE, FERRITIN, TIBC, IRON,  RETICCTPCT in the last 72 hours. Urine analysis:    Component Value Date/Time   COLORURINE YELLOW 04/24/2021 0856   APPEARANCEUR CLEAR 04/24/2021 0856   LABSPEC 1.020 04/24/2021 0856   PHURINE 6.0 04/24/2021 0856   GLUCOSEU NEGATIVE 04/24/2021 0856   HGBUR NEGATIVE 04/24/2021 0856   BILIRUBINUR NEGATIVE 04/24/2021 0856   KETONESUR NEGATIVE 04/24/2021 0856   PROTEINUR 30 (A) 04/24/2021 0856   NITRITE POSITIVE (A) 04/24/2021 0856   LEUKOCYTESUR SMALL (A) 04/24/2021 0856   Sepsis Labs: @LABRCNTIP (procalcitonin:4,lacticidven:4)  ) Recent Results (from the past 240 hour(s))  Blood Culture (routine x 2)     Status: None (Preliminary result)   Collection Time: 04/24/21  6:12 AM   Specimen: BLOOD RIGHT ARM  Result Value Ref Range Status   Specimen Description   Final    BLOOD RIGHT ARM BOTTLES DRAWN AEROBIC AND ANAEROBIC   Special Requests Blood Culture adequate volume  Final   Culture   Final    NO GROWTH 2 DAYS Performed at [facility], [address] [place], [place] [address]    Report Status PENDING  Incomplete  Blood Culture (routine x 2)     Status: None (Preliminary result)   Collection Time: 04/24/21  6:15 AM   Specimen: BLOOD RIGHT ARM  Result Value Ref Range Status   Specimen Description   Final    BLOOD RIGHT ARM BOTTLES DRAWN AEROBIC AND ANAEROBIC   Special Requests   Final    Blood Culture results may not be optimal due to an excessive volume of blood received in culture bottles   Culture   Final    NO GROWTH 2 DAYS Performed at [facility], [address], [place], [place] [address]    Report Status PENDING  Incomplete  Resp Panel by RT-PCR (Flu A&B, Covid) Nasopharyngeal Swab     Status: None   Collection Time: 04/24/21  7:20 AM   Specimen: Nasopharyngeal Swab; Nasopharyngeal(NP) swabs in vial transport medium  Result Value Ref Range Status   SARS Coronavirus 2 by  RT PCR NEGATIVE NEGATIVE Final    Comment: (NOTE) SARS-CoV-2 target nucleic acids are NOT  DETECTED.  The SARS-CoV-2 RNA is generally detectable in upper respiratory specimens during the acute phase of infection. The lowest concentration of SARS-CoV-2 viral copies this assay can detect is 138 copies/mL. A negative result does not preclude SARS-Cov-2 infection and should not be used as the sole basis for treatment or other patient management decisions. A negative result may occur with  improper specimen collection/handling, submission of specimen other than nasopharyngeal swab, presence of viral mutation(s) within the areas targeted by this assay, and inadequate number of viral copies(<138 copies/mL). A negative result must be combined with clinical observations, patient history, and epidemiological information. The expected result is Negative.  Fact Sheet for Patients:  EntrepreneurPulse.com.au  Fact Sheet for Healthcare Providers:  IncredibleEmployment.be  This test is no t yet approved or cleared by the [place] FDA and  has been authorized for detection and/or diagnosis of SARS-CoV-2 by FDA under an Emergency Use Authorization (EUA). This EUA will remain  in effect (meaning this test can be used) for the duration of the COVID-19 declaration under Section 564(b)(1) of the Act, 21 U.S.C.section 360bbb-3(b)(1), unless the authorization is terminated  or revoked sooner.       Influenza A by PCR NEGATIVE NEGATIVE Final   Influenza B by PCR NEGATIVE NEGATIVE Final    Comment: (NOTE) The Xpert Xpress SARS-CoV-2/FLU/RSV plus assay is intended as an aid in the diagnosis of influenza from Nasopharyngeal swab specimens and should not be used as a sole basis for treatment. Nasal washings and aspirates are unacceptable for Xpert Xpress SARS-CoV-2/FLU/RSV testing.  Fact Sheet for Patients: EntrepreneurPulse.com.au  Fact Sheet for Healthcare Providers: IncredibleEmployment.be  This test is not yet  approved or cleared by the [place] FDA and has been authorized for detection and/or diagnosis of SARS-CoV-2 by FDA under an Emergency Use Authorization (EUA). This EUA will remain in effect (meaning this test can be used) for the duration of the COVID-19 declaration under Section 564(b)(1) of the Act, 21 U.S.C. section 360bbb-3(b)(1), unless the authorization is terminated or revoked.  Performed at [facility], [address] [place], [place] [address]   Urine Culture     Status: Abnormal   Collection Time: 04/24/21  8:58 AM   Specimen: Urine, Catheterized  Result Value Ref Range Status   Specimen Description   Final    URINE, CATHETERIZED Performed at [facility], [address], [place], [place] [address]    Special Requests   Final    NONE Performed at [facility], [address] [place], [place] [address]    Culture >=100,000 COLONIES/mL ESCHERICHIA COLI (A)  Final   Report Status 04/26/2021 FINAL  Final   Organism ID, Bacteria ESCHERICHIA COLI (A)  Final      Susceptibility   Escherichia coli - MIC*    AMPICILLIN >=32 RESISTANT Resistant     CEFAZOLIN <=4 SENSITIVE Sensitive     CEFEPIME <=0.12 SENSITIVE Sensitive     CEFTRIAXONE <=0.25 SENSITIVE Sensitive     CIPROFLOXACIN <=0.25 SENSITIVE Sensitive     GENTAMICIN <=1 SENSITIVE Sensitive     IMIPENEM <=0.25 SENSITIVE Sensitive     NITROFURANTOIN <=16 SENSITIVE Sensitive     TRIMETH/SULFA <=20 SENSITIVE Sensitive     AMPICILLIN/SULBACTAM >=32 RESISTANT Resistant     PIP/TAZO <=4 SENSITIVE Sensitive     * >=100,000 COLONIES/mL ESCHERICHIA COLI  MRSA Next Gen by PCR, Nasal  Status: None   Collection Time: 04/24/21  3:08 PM   Specimen: Nasal Mucosa; Nasal Swab  Result Value Ref Range Status   MRSA by PCR Next Gen NOT DETECTED NOT DETECTED Final    Comment: (NOTE) The GeneXpert MRSA Assay (FDA approved for NASAL specimens only), is one component of a comprehensive MRSA colonization surveillance program. It  is not intended to diagnose MRSA infection nor to guide or monitor treatment for MRSA infections. Test performance is not FDA approved in patients less than 7 years old. Performed at [facility], [address] [place], [place] [address]       Radiology Studies: DG Chest 2 View  Result Date: 04/26/2021 CLINICAL DATA:  Shortness of breath EXAM: CHEST - 2 VIEW COMPARISON:  Previous studies including the examination of 04/24/2021 FINDINGS: Transverse diameter of heart is increased. Central pulmonary vessels are less prominent. There is decrease in interstitial markings in the parahilar regions. There are no new focal infiltrates. There is blunting of both lateral CP angles. Low position of diaphragms suggests COPD. There is no pneumothorax. Pacemaker battery is seen in the left infraclavicular region. IMPRESSION: Cardiomegaly. COPD. There is decrease in pulmonary vascular congestion and decrease in interstitial pulmonary edema. Small bilateral pleural effusions. Electronically Signed   By: Elmer Picker M.D.   On: 04/26/2021 11:33     Scheduled Meds:  apixaban  2.5 mg Oral BID   carvedilol  6.25 mg Oral BID WC   Chlorhexidine Gluconate Cloth  6 each Topical Daily   dextromethorphan-guaiFENesin  1 tablet Oral BID   digoxin  0.125 mg Oral Once per day on Mon Tue Wed Thu Fri   furosemide  20 mg Intravenous Daily   [START ON 04/27/2021] hydrALAZINE  25 mg Oral BID   isosorbide mononitrate  30 mg Oral Daily   levothyroxine  88 mcg Oral QAC breakfast   lisinopril  10 mg Oral Daily   loratadine  10 mg Oral q morning   polyvinyl alcohol  1 drop Both Eyes TID   sodium chloride flush  3 mL Intravenous Q12H   sodium chloride flush  3 mL Intravenous Q12H   Continuous Infusions:  sodium chloride     cefTRIAXone (ROCEPHIN)  IV 1 g (04/26/21 1745)     LOS: 2 days    Roxan Hockey M.D on 04/26/2021 at 7:08 PM  Go to [email or web address] - for contact info  [facility] - Office   [phone]  If 7PM-7AM, please contact night-coverage [email or web address] Password [email or web address] 04/26/2021, 7:08 PM

## 2021-04-26 NOTE — Evaluation (Signed)
Physical Therapy Evaluation Patient Details Name: [name] MRN: [id number] DOB: 1924-07-08 Today's Date: 04/26/2021  History of Present Illness  [name]  is a 88 y.o. female with h/o HTN, PAFib/SSS, s/p PPM , CAD, chronic combined systolic and diastolic dysfunction CHF and hearing loss who presents to the ED with worsening dyspnea and is found to be hypoxic in the ED  -  -Additional history obtained from patient's nephew/POA Dr Corey Skains  -Apparently patient does not have any respiratory symptoms of shortness of breath for several weeks she was diagnosed with pneumonia back in December 2022, treated with antibiotics over the last couple weeks she was given additional steroids and antibiotics by PCP but symptoms persisted   Clinical Impression  Patient unsteady on feet with tendency to drop into chair during stand to sits and has to lean on nearby objects for support when taking steps.  Patient required RW for safety and demonstrates fair/good return for ambulation in room and hallways without loss of balance, limited mostly due to fatigue, on 2 LPM O2 with SpO2 at 93%, when on room air patient desaturated from 94% to 80% with exertion and left on 2 LPM after therapy.  Patient tolerated sitting up in chair after therapy with her boyfriend present in room - nursing staff notified.  Patient will benefit from continued skilled physical therapy in hospital and recommended venue below to increase strength, balance, endurance for safe ADLs and gait.         Recommendations for follow up therapy are one component of a multi-disciplinary discharge planning process, led by the attending physician.  Recommendations may be updated based on patient status, additional functional criteria and insurance authorization.  Follow Up Recommendations Home health PT    Assistance Recommended at Discharge Set up Supervision/Assistance  Patient can return home with the following  A little help with walking  and/or transfers;A little help with bathing/dressing/bathroom;Help with stairs or ramp for entrance;Assistance with cooking/housework    Equipment Recommendations Rolling walker (2 wheels)  Recommendations for Other Services       Functional Status Assessment Patient has had a recent decline in their functional status and demonstrates the ability to make significant improvements in function in a reasonable and predictable amount of time.     Precautions / Restrictions Precautions Precautions: Fall Restrictions Weight Bearing Restrictions: No      Mobility  Bed Mobility Overal bed mobility: Modified Independent             General bed mobility comments: slightly labored movement    Transfers Overall transfer level: Needs assistance Equipment used: Rolling walker (2 wheels), None Transfers: Sit to/from Stand, Bed to chair/wheelchair/BSC Sit to Stand: Supervision, Min guard   Step pivot transfers: Min guard       General transfer comment: unsteady on feet with occasional loss of balance requiring use of RW for safety    Ambulation/Gait Ambulation/Gait assistance: Min guard Gait Distance (Feet): 100 Feet Assistive device: Rolling walker (2 wheels) Gait Pattern/deviations: Decreased step length - right, Decreased step length - left, Decreased stride length Gait velocity: slightly decreased     General Gait Details: slightly labored cadence without loss of balance using RW, on 2 LPM O2 with SpO2 at 93%  Stairs            Wheelchair Mobility    Modified Rankin (Stroke Patients Only)       Balance Overall balance assessment: Needs assistance Sitting-balance support: Feet supported, No upper extremity supported  Sitting balance-Leahy Scale: Good Sitting balance - Comments: seated at EOB   Standing balance support: During functional activity, No upper extremity supported Standing balance-Leahy Scale: Poor Standing balance comment: fair/poor without AD  fair/good using RW                             Pertinent Vitals/Pain Pain Assessment Pain Assessment: No/denies pain    Home Living Family/patient expects to be discharged to:: Private residence Living Arrangements: Alone Available Help at Discharge: Friend(s);Available 24 hours/day;Other (Comment) (Boyfriend) Type of Home: House Home Access: Stairs to enter Entrance Stairs-Rails: Right;Left (to wide to reach both) Entrance Stairs-Number of Steps: 2-3   Home Layout: One level Home Equipment: Grab bars - tub/shower      Prior Function Prior Level of Function : Independent/Modified Independent             Mobility Comments: Hydrographic surveyor, drives ADLs Comments: Independent     Hand Dominance        Extremity/Trunk Assessment   Upper Extremity Assessment Upper Extremity Assessment: Generalized weakness    Lower Extremity Assessment Lower Extremity Assessment: Generalized weakness    Cervical / Trunk Assessment Cervical / Trunk Assessment: Normal  Communication   Communication: No difficulties  Cognition Arousal/Alertness: Awake/alert Behavior During Therapy: WFL for tasks assessed/performed Overall Cognitive Status: Within Functional Limits for tasks assessed                                          General Comments      Exercises     Assessment/Plan    PT Assessment Patient needs continued PT services  PT Problem List Decreased strength;Decreased activity tolerance;Decreased balance;Decreased mobility       PT Treatment Interventions DME instruction;Gait training;Stair training;Functional mobility training;Therapeutic activities;Therapeutic exercise;Balance training;Patient/family education    PT Goals (Current goals can be found in the Care Plan section)  Acute Rehab PT Goals Patient Stated Goal: return home with boyfriend to assist PT Goal Formulation: With patient/family Time For Goal Achievement:  04/29/21 Potential to Achieve Goals: Good    Frequency Min 3X/week     Co-evaluation               AM-PAC PT "6 Clicks" Mobility  Outcome Measure Help needed turning from your back to your side while in a flat bed without using bedrails?: None Help needed moving from lying on your back to sitting on the side of a flat bed without using bedrails?: None Help needed moving to and from a bed to a chair (including a wheelchair)?: A Little Help needed standing up from a chair using your arms (e.g., wheelchair or bedside chair)?: A Little Help needed to walk in hospital room?: A Little Help needed climbing 3-5 steps with a railing? : A Lot 6 Click Score: 19    End of Session Equipment Utilized During Treatment: Oxygen Activity Tolerance: Patient tolerated treatment well;Patient limited by fatigue Patient left: in chair;with call bell/phone within reach;with family/visitor present Nurse Communication: Mobility status PT Visit Diagnosis: Unsteadiness on feet (R26.81);Other abnormalities of gait and mobility (R26.89);Muscle weakness (generalized) (M62.81)    Time: 0981-1914 PT Time Calculation (min) (ACUTE ONLY): 30 min   Charges:   PT Evaluation $PT Eval Moderate Complexity: 1 Mod PT Treatments $Therapeutic Activity: 23-37 mins        3:07 PM, 04/26/21  Lonell Grandchild, MPT Physical Therapist with [facility] 336 [phone] office [phone] mobile phone

## 2021-04-26 NOTE — Plan of Care (Signed)
°  Problem: Acute Rehab PT Goals(only PT should resolve) Goal: Pt Will Go Supine/Side To Sit Outcome: Progressing Flowsheets (Taken 04/26/2021 1508) Pt will go Supine/Side to Sit:  Independently  with modified independence Goal: Patient Will Transfer Sit To/From Stand Outcome: Progressing Flowsheets (Taken 04/26/2021 1508) Patient will transfer sit to/from stand:  with modified independence  with supervision Goal: Pt Will Transfer Bed To Chair/Chair To Bed Outcome: Progressing Flowsheets (Taken 04/26/2021 1508) Pt will Transfer Bed to Chair/Chair to Bed:  with modified independence  with supervision Goal: Pt Will Ambulate Outcome: Progressing Flowsheets (Taken 04/26/2021 1508) Pt will Ambulate:  > 125 feet  with modified independence  with supervision  with rolling walker   3:08 PM, 04/26/21 Lonell Grandchild, MPT Physical Therapist with [facility] 336 [phone] office [phone] mobile phone

## 2021-04-26 NOTE — Progress Notes (Signed)
°  °  SATURATION QUALIFICATIONS: (This note is used to comply with regulatory documentation for home oxygen)   Patient Saturations on Room Air at Rest = 83 %   Patient Saturations on Room Air while Ambulating = 80 %   Patient Saturations on 2 Liters of oxygen while Ambulating = 92 %       Patient needs continuous O2 at 2 L/min continuously via nasal cannula with humidifier, with gaseous portability and conserving device    Dx--- COPD/combined diastolic and systolic heart failure/CHF  Roxan Hockey, MD

## 2021-04-26 NOTE — TOC Progression Note (Addendum)
Transition of Care [facility]) - Progression Note    Patient Details  Name: [name] MRN: [id number] Date of Birth: January 10, 1925  Transition of Care [facility]) CM/SW [place], [place] Phone Number: 04/26/2021, 1:47 PM  Clinical Narrative:    PT is recommending [place] [facility] for pt at D/C and RW. CSW spoke with pt in room to see if agreeable to [facility]. Pt is agreeable to [facility] services at D/C. CSW spoke to [place] with [facility] [place] who states they can accept pt for [facility] services at D/C. CSW requested that MD place [facility] orders. CSW spoke to [place] with [employer] who states they can get the RW ordered for pt and delivered to the room prior to D/C. Walker delivered to room by [employer] rep. CSW also made aware that pt is currently on O2 and does not wear O2 chronically at home. CSW requested that MD place orders for sat qual and home O2 orders if needed. TOC to set up home O2 once sat qual is completed and orders are placed. TOC to follow.   Expected Discharge Plan: Home/Self Care Barriers to Discharge: Continued Medical Work up  Expected Discharge Plan and Services Expected Discharge Plan: Home/Self Care In-house Referral: Clinical Social Work Discharge Planning Services: CM Consult   Living arrangements for the past 2 months: Single Family Home                                       Social Determinants of Health (SDOH) Interventions    Readmission Risk Interventions Readmission Risk Prevention Plan 04/25/2021  Transportation Screening Complete  HRI or [place] Complete  Social Work Consult for [place] Planning/Counseling Complete  Palliative Care Screening Not Applicable  Medication Review Press photographer) Complete  Some recent data might be hidden

## 2021-04-27 DIAGNOSIS — I5043 Acute on chronic combined systolic (congestive) and diastolic (congestive) heart failure: Secondary | ICD-10-CM | POA: Diagnosis not present

## 2021-04-27 LAB — GLUCOSE, CAPILLARY
Glucose-Capillary: 144 mg/dL — ABNORMAL HIGH (ref 70–99)
Glucose-Capillary: 200 mg/dL — ABNORMAL HIGH (ref 70–99)

## 2021-04-27 LAB — BASIC METABOLIC PANEL
Anion gap: 7 (ref 5–15)
BUN: 36 mg/dL — ABNORMAL HIGH (ref 8–23)
CO2: 32 mmol/L (ref 22–32)
Calcium: 8.8 mg/dL — ABNORMAL LOW (ref 8.9–10.3)
Chloride: 98 mmol/L (ref 98–111)
Creatinine, Ser: 1.12 mg/dL — ABNORMAL HIGH (ref 0.44–1.00)
GFR, Estimated: 46 mL/min — ABNORMAL LOW (ref >60)
Glucose, Bld: 103 mg/dL — ABNORMAL HIGH (ref 70–99)
Potassium: 3.7 mmol/L (ref 3.5–5.1)
Sodium: 137 mmol/L (ref 135–145)

## 2021-04-27 LAB — CBC
HCT: 34.6 % — ABNORMAL LOW (ref 36.0–46.0)
Hemoglobin: 10.9 g/dL — ABNORMAL LOW (ref 12.0–15.0)
MCH: 31.1 pg (ref 26.0–34.0)
MCHC: 31.5 g/dL (ref 30.0–36.0)
MCV: 98.6 fL (ref 80.0–100.0)
Platelets: 173 10*3/uL (ref 150–400)
RBC: 3.51 MIL/uL — ABNORMAL LOW (ref 3.87–5.11)
RDW: 14.5 % (ref 11.5–15.5)
WBC: 17 10*3/uL — ABNORMAL HIGH (ref 4.0–10.5)
nRBC: 0 % (ref 0.0–0.2)

## 2021-04-27 MED ORDER — SODIUM CHLORIDE 0.9 % IV SOLN
1.0000 g | Freq: Once | INTRAVENOUS | Status: AC
  Administered 2021-04-27: 1 g via INTRAVENOUS
  Filled 2021-04-27: qty 10

## 2021-04-27 MED ORDER — ALBUTEROL SULFATE (2.5 MG/3ML) 0.083% IN NEBU: 2.5000 mg | INHALATION_SOLUTION | Freq: Four times a day (QID) | RESPIRATORY_TRACT | Status: DC | PRN

## 2021-04-27 MED ORDER — BISACODYL 10 MG RE SUPP
10.0000 mg | Freq: Once | RECTAL | Status: DC
  Filled 2021-04-27: qty 1

## 2021-04-27 MED ORDER — FUROSEMIDE 20 MG PO TABS
20.0000 mg | ORAL_TABLET | Freq: Once | ORAL | Status: AC
  Administered 2021-04-27: 20 mg via ORAL
  Filled 2021-04-27: qty 1

## 2021-04-27 MED ORDER — HYDRALAZINE HCL 25 MG PO TABS
50.0000 mg | ORAL_TABLET | Freq: Two times a day (BID) | ORAL | Status: DC
  Administered 2021-04-27 (×2): 50 mg via ORAL
  Filled 2021-04-27 (×3): qty 2

## 2021-04-27 MED ORDER — SODIUM CHLORIDE 0.9 % IV SOLN: 1.0000 g | INTRAVENOUS | Status: DC

## 2021-04-27 MED ORDER — POLYETHYLENE GLYCOL 3350 17 G PO PACK
17.0000 g | PACK | Freq: Every day | ORAL | Status: DC
  Administered 2021-04-27 – 2021-04-28 (×2): 17 g via ORAL
  Filled 2021-04-27 (×2): qty 1

## 2021-04-27 MED ORDER — METOLAZONE 5 MG PO TABS
5.0000 mg | ORAL_TABLET | Freq: Once | ORAL | Status: AC
  Administered 2021-04-27: 5 mg via ORAL
  Filled 2021-04-27: qty 1

## 2021-04-27 NOTE — Progress Notes (Signed)
PROGRESS NOTE     [name], is a 88 y.o. female, DOB - 03-10-1925, WEX:[id number]  Admit date - 04/24/2021   Admitting Physician Trentin Knappenberger Denton Brick, MD  Outpatient Primary MD for the patient is Sharilyn Sites, MD  LOS - 3  Chief Complaint  Patient presents with   Shortness of Breath    Cough/PNA x 1 month        Brief Narrative:  88 y.o. female with h/o HTN, PAFib/SSS, s/p PPM , CAD, chronic combined systolic and diastolic dysfunction CHF and hearing loss admitted with Acute on chronic combined systolic and diastolic dysfunction CHF exacerbation resulting in hypoxic respiratory failure-on 04/24/2021  Improved with Diuresis , awaiting insurance approval for transfer to SNF rehab  Assessment & Plan:   Principal Problem:   Acute on chronic combined systolic and diastolic CHF (congestive heart failure) ([place]) Active Problems:   Hypertensive emergency   Cardiac pacemaker in situ   1)Acute on chronic combined systolic and diastolic dysfunction CHF exacerbation resulting in hypoxic respiratory failure- - initial troponin at 33, repeat 39 -BNP 2877 -Echo with EF of 20 to 25% with global hypokinesis, and grade 1 diastolic dysfunction -c/n IV Lasix as ordered, = Fluid input and output monitoring and Daily weights Dyspnea and Hypoxia improved, voiding well -Repeat chest x-ray on 04/26/2021 showed improved CHF -Standing scale weight is down to 99 pounds, patient weighed over 111 pounds this admission -At baseline weight is usually around 106 pounds   2)Social/Ethics-- While in ED RN Lyn Henri at bedside and ED paramedic at bedside, patient requesting DNR/DNI status, patient verbalizes understanding of DNR status and implications -Please see separate documentation from ED RN Lyn Henri -Advanced directives and plan of care discussed with patient's nephew/POA Dr Corey Skains---  -Patient and HCPOA request DNR/DNI status -PTA patient lived alone, she still drives locally in town   3)  acute hypoxic respiratory failure secondary to #1 above--- continue supplemental oxygen  -See #1 above -Patient will most likely need oxygen upon discharge, currently requiring 2 L via nasal cannula with activity  4)Lactic Acidosis-lactic acid is 2.2, suspect this is secondary to hypoxia rather than frank sepsis -Repeat lactic acid 1.7   5) leukocytosis--- may be partly due to recent repeated doses of steroids -Repeat chest x-ray without evidence of pneumonia -Continue IV Rocephin for E. coli UTI   6) E. coli UTI--treated with IV Rocephin    7) PAFib/SSS, s/p PPM -----Dig level was low therapeutic at 0.9, continue digoxin  -Continue Eliquis for stroke prophylaxis  8) hypertensive emergency--- resulting in congestive heart failure with hypoxia ---BP improved on IV nitro drip, weaned off nitro drip -Continue Coreg,  lisinopril, hydralazine and isosorbide  - 9) generalized weakness and deconditioning--PT eval appreciated recommends home health PT, Patient and family requested SNF rehab -Awaiting insurance input/approval -TOC working with family and SNF facility    Disposition/Need for in-Hospital Stay- patient unable to be discharged at this time due to-clinically improved, awaiting transfer to SNF rehab pending insurance approval or private pay arrangements -Otherwise discharge home with home health and home oxygen -   Status is: Inpatient   Remains inpatient appropriate because:    Dispo: The patient is from: Home              Anticipated d/c is to:  Home with home health versus SNF rehab              Anticipated d/c date is: 1 days  Patient currently is  medically stable to d/c. Barriers: Insurance approval   Code Status :  -  Code Status: DNR   Family Communication:    (patient is alert, awake and coherent)  discussed with patient's nephew/POA Dr Corey Skains---  Consults  :    DVT Prophylaxis  :   - SCDs   apixaban (ELIQUIS) tablet 2.5 mg Start: 04/24/21  1000 SCDs Start: 04/24/21 0844 Place TED hose Start: 04/24/21 0844 apixaban (ELIQUIS) tablet 2.5 mg    Lab Results  Component Value Date   PLT 173 04/27/2021    Inpatient Medications  Scheduled Meds:  apixaban  2.5 mg Oral BID   bisacodyl  10 mg Rectal Once   carvedilol  6.25 mg Oral BID WC   Chlorhexidine Gluconate Cloth  6 each Topical Daily   dextromethorphan-guaiFENesin  1 tablet Oral BID   digoxin  0.125 mg Oral Once per day on Mon Tue Wed Thu Fri   furosemide  20 mg Intravenous Daily   hydrALAZINE  50 mg Oral BID   isosorbide mononitrate  30 mg Oral Daily   levothyroxine  88 mcg Oral QAC breakfast   lisinopril  10 mg Oral Daily   loratadine  10 mg Oral q morning   polyethylene glycol  17 g Oral Daily   polyvinyl alcohol  1 drop Both Eyes TID   sodium chloride flush  3 mL Intravenous Q12H   sodium chloride flush  3 mL Intravenous Q12H   Continuous Infusions:  sodium chloride     PRN Meds:.sodium chloride, acetaminophen **OR** acetaminophen, albuterol, ALPRAZolam, bisacodyl, hydrALAZINE, ondansetron **OR** ondansetron (ZOFRAN) IV, polyethylene glycol, sodium chloride flush, traZODone   Anti-infectives (From admission, onward)    Start     Dose/Rate Route Frequency Ordered Stop   04/27/21 1830  cefTRIAXone (ROCEPHIN) 1 g in sodium chloride 0.9 % 100 mL IVPB  Status:  Discontinued        1 g 200 mL/hr over 30 Minutes Intravenous Every 24 hours 04/27/21 0806 04/27/21 0952   04/27/21 1200  cefTRIAXone (ROCEPHIN) 1 g in sodium chloride 0.9 % 100 mL IVPB        1 g 200 mL/hr over 30 Minutes Intravenous  Once 04/27/21 0952 04/27/21 1559   04/24/21 1830  cefTRIAXone (ROCEPHIN) 1 g in sodium chloride 0.9 % 100 mL IVPB  Status:  Discontinued        1 g 200 mL/hr over 30 Minutes Intravenous Every 24 hours 04/24/21 1731 04/27/21 5427        Subjective: [name] today has no fevers, no emesis,  No chest pain,   HCPOA--Nephew at bedside -Patient's boyfriend Nicole Kindred also  at bedside -Voiding well -No dyspnea at rest, Had episode of respiratory distress/cough and wheezing overnight responded well to bronchodilator therapy -Ambulated with physical therapist-please see separate documentation from PT   Objective: Vitals:   04/27/21 0709 04/27/21 0843 04/27/21 0912 04/27/21 1217  BP:   (!) 153/55 (!) 145/58  Pulse:   78 75  Resp:   18 20  Temp:   98.7 F (37.1 C) 98 F (36.7 C)  TempSrc:   Oral Oral  SpO2:   94% 95%  Weight: 48.1 kg 45.1 kg    Height:        Intake/Output Summary (Last 24 hours) at 04/27/2021 1910 Last data filed at 04/27/2021 1700 Gross per 24 hour  Intake 723 ml  Output 300 ml  Net 423 ml   [employer]  04/26/21 0400 04/27/21 0709 04/27/21 0843  Weight: 50 kg 48.1 kg 45.1 kg    Physical Exam  Gen:- Awake Alert, in no acute distress, speaking in complete sentences at rest HEENT:- [place].AT, No sclera icterus Ears- HOH, uses hearing aids improved Nose- [place] 2L/min Neck-Supple Neck,No JVD,.  Lungs-improved air movement, no wheezing CV- S1, S2 normal, regular , pacemaker in situ Abd-  +ve B.Sounds, Abd Soft, No tenderness,    Extremity/Skin:-Improved edema, pedal pulses present  Psych-affect is appropriate, oriented x3 Neuro-generalized weakness, no new focal deficits, no tremors  Data Reviewed: I have personally reviewed following labs and imaging studies  CBC: Recent Labs  Lab 04/24/21 0611 04/25/21 0508 04/27/21 0523  WBC 18.4* 15.7* 17.0*  NEUTROABS 14.2*  --   --   HGB 13.0 10.4* 10.9*  HCT 42.3 32.4* 34.6*  MCV 97.0 94.5 98.6  PLT 190 168 614   Basic Metabolic Panel: Recent Labs  Lab 04/24/21 0611 04/25/21 0508 04/26/21 0259 04/27/21 0523  NA 137 136 135 137  K 4.8 3.7 3.6 3.7  CL 103 97* 97* 98  CO2 27 31 32 32  GLUCOSE 158* 121* 104* 103*  BUN 32* 37* 37* 36*  CREATININE 1.12* 1.15* 1.22* 1.12*  CALCIUM 9.3 8.6* 8.1* 8.8*  PHOS  --   --  3.0  --    GFR: Estimated Creatinine Clearance: 21.9  mL/min (A) (by C-G formula based on SCr of 1.12 mg/dL (H)). Liver Function Tests: Recent Labs  Lab 04/24/21 0611 04/26/21 0259  AST 108*  --   ALT 160*  --   ALKPHOS 143*  --   BILITOT 0.9  --   PROT 6.8  --   ALBUMIN 3.6 2.6*   No results for input(s): LIPASE, AMYLASE in the last 168 hours. No results for input(s): AMMONIA in the last 168 hours. Coagulation Profile: Recent Labs  Lab 04/24/21 0611  INR 1.2   Cardiac Enzymes: No results for input(s): CKTOTAL, CKMB, CKMBINDEX, TROPONINI in the last 168 hours. BNP (last 3 results) No results for input(s): PROBNP in the last 8760 hours. HbA1C: No results for input(s): HGBA1C in the last 72 hours. CBG: Recent Labs  Lab 04/25/21 2359 04/26/21 0554 04/26/21 1136 04/27/21 1110 04/27/21 1819  GLUCAP 126* 109* 129* 144* 200*   Lipid Profile: No results for input(s): CHOL, HDL, LDLCALC, TRIG, CHOLHDL, LDLDIRECT in the last 72 hours. Thyroid Function Tests: No results for input(s): TSH, T4TOTAL, FREET4, T3FREE, THYROIDAB in the last 72 hours. Anemia Panel: No results for input(s): VITAMINB12, FOLATE, FERRITIN, TIBC, IRON, RETICCTPCT in the last 72 hours. Urine analysis:    Component Value Date/Time   COLORURINE YELLOW 04/24/2021 0856   APPEARANCEUR CLEAR 04/24/2021 0856   LABSPEC 1.020 04/24/2021 0856   PHURINE 6.0 04/24/2021 0856   GLUCOSEU NEGATIVE 04/24/2021 0856   HGBUR NEGATIVE 04/24/2021 0856   BILIRUBINUR NEGATIVE 04/24/2021 0856   KETONESUR NEGATIVE 04/24/2021 0856   PROTEINUR 30 (A) 04/24/2021 0856   NITRITE POSITIVE (A) 04/24/2021 0856   LEUKOCYTESUR SMALL (A) 04/24/2021 0856   Sepsis Labs: @LABRCNTIP (procalcitonin:4,lacticidven:4)  ) Recent Results (from the past 240 hour(s))  Blood Culture (routine x 2)     Status: None (Preliminary result)   Collection Time: 04/24/21  6:12 AM   Specimen: BLOOD RIGHT ARM  Result Value Ref Range Status   Specimen Description   Final    BLOOD RIGHT ARM BOTTLES DRAWN  AEROBIC AND ANAEROBIC   Special Requests Blood Culture adequate volume  Final  Culture   Final    NO GROWTH 3 DAYS Performed at [facility], [address], [place], [place] [address]    Report Status PENDING  Incomplete  Blood Culture (routine x 2)     Status: None (Preliminary result)   Collection Time: 04/24/21  6:15 AM   Specimen: BLOOD RIGHT ARM  Result Value Ref Range Status   Specimen Description   Final    BLOOD RIGHT ARM BOTTLES DRAWN AEROBIC AND ANAEROBIC   Special Requests   Final    Blood Culture results may not be optimal due to an excessive volume of blood received in culture bottles   Culture   Final    NO GROWTH 3 DAYS Performed at [facility], [address] [place], [place] [address]    Report Status PENDING  Incomplete  Resp Panel by RT-PCR (Flu A&B, Covid) Nasopharyngeal Swab     Status: None   Collection Time: 04/24/21  7:20 AM   Specimen: Nasopharyngeal Swab; Nasopharyngeal(NP) swabs in vial transport medium  Result Value Ref Range Status   SARS Coronavirus 2 by RT PCR NEGATIVE NEGATIVE Final    Comment: (NOTE) SARS-CoV-2 target nucleic acids are NOT DETECTED.  The SARS-CoV-2 RNA is generally detectable in upper respiratory specimens during the acute phase of infection. The lowest concentration of SARS-CoV-2 viral copies this assay can detect is 138 copies/mL. A negative result does not preclude SARS-Cov-2 infection and should not be used as the sole basis for treatment or other patient management decisions. A negative result may occur with  improper specimen collection/handling, submission of specimen other than nasopharyngeal swab, presence of viral mutation(s) within the areas targeted by this assay, and inadequate number of viral copies(<138 copies/mL). A negative result must be combined with clinical observations, patient history, and epidemiological information. The expected result is Negative.  Fact Sheet for Patients:   EntrepreneurPulse.com.au  Fact Sheet for Healthcare Providers:  IncredibleEmployment.be  This test is no t yet approved or cleared by the [place] FDA and  has been authorized for detection and/or diagnosis of SARS-CoV-2 by FDA under an Emergency Use Authorization (EUA). This EUA will remain  in effect (meaning this test can be used) for the duration of the COVID-19 declaration under Section 564(b)(1) of the Act, 21 U.S.C.section 360bbb-3(b)(1), unless the authorization is terminated  or revoked sooner.       Influenza A by PCR NEGATIVE NEGATIVE Final   Influenza B by PCR NEGATIVE NEGATIVE Final    Comment: (NOTE) The Xpert Xpress SARS-CoV-2/FLU/RSV plus assay is intended as an aid in the diagnosis of influenza from Nasopharyngeal swab specimens and should not be used as a sole basis for treatment. Nasal washings and aspirates are unacceptable for Xpert Xpress SARS-CoV-2/FLU/RSV testing.  Fact Sheet for Patients: EntrepreneurPulse.com.au  Fact Sheet for Healthcare Providers: IncredibleEmployment.be  This test is not yet approved or cleared by the [place] FDA and has been authorized for detection and/or diagnosis of SARS-CoV-2 by FDA under an Emergency Use Authorization (EUA). This EUA will remain in effect (meaning this test can be used) for the duration of the COVID-19 declaration under Section 564(b)(1) of the Act, 21 U.S.C. section 360bbb-3(b)(1), unless the authorization is terminated or revoked.  Performed at [facility], [address], [place], [place] [address]   Urine Culture     Status: Abnormal   Collection Time: 04/24/21  8:58 AM   Specimen: Urine, Catheterized  Result Value Ref Range Status   Specimen Description  Final    URINE, CATHETERIZED Performed at [facility], [address], [place], [place] [address]    Special Requests   Final    NONE Performed at [facility], [address], [place], [place] [address]    Culture >=100,000 COLONIES/mL ESCHERICHIA COLI (A)  Final   Report Status 04/26/2021 FINAL  Final   Organism ID, Bacteria ESCHERICHIA COLI (A)  Final      Susceptibility   Escherichia coli - MIC*    AMPICILLIN >=32 RESISTANT Resistant     CEFAZOLIN <=4 SENSITIVE Sensitive     CEFEPIME <=0.12 SENSITIVE Sensitive     CEFTRIAXONE <=0.25 SENSITIVE Sensitive     CIPROFLOXACIN <=0.25 SENSITIVE Sensitive     GENTAMICIN <=1 SENSITIVE Sensitive     IMIPENEM <=0.25 SENSITIVE Sensitive     NITROFURANTOIN <=16 SENSITIVE Sensitive     TRIMETH/SULFA <=20 SENSITIVE Sensitive     AMPICILLIN/SULBACTAM >=32 RESISTANT Resistant     PIP/TAZO <=4 SENSITIVE Sensitive     * >=100,000 COLONIES/mL ESCHERICHIA COLI  MRSA Next Gen by PCR, Nasal     Status: None   Collection Time: 04/24/21  3:08 PM   Specimen: Nasal Mucosa; Nasal Swab  Result Value Ref Range Status   MRSA by PCR Next Gen NOT DETECTED NOT DETECTED Final    Comment: (NOTE) The GeneXpert MRSA Assay (FDA approved for NASAL specimens only), is one component of a comprehensive MRSA colonization surveillance program. It is not intended to diagnose MRSA infection nor to guide or monitor treatment for MRSA infections. Test performance is not FDA approved in patients less than 78 years old. Performed at [facility], [address] [place], [place] [address]       Radiology Studies: DG Chest 2 View  Result Date: 04/26/2021 CLINICAL DATA:  Shortness of breath EXAM: CHEST - 2 VIEW COMPARISON:  Previous studies including the examination of 04/24/2021 FINDINGS: Transverse diameter of heart is increased. Central pulmonary vessels are less prominent. There is decrease in interstitial markings in the parahilar regions. There are no new focal infiltrates. There is blunting of both lateral CP angles. Low position of diaphragms suggests COPD. There is no pneumothorax. Pacemaker battery is seen in the left  infraclavicular region. IMPRESSION: Cardiomegaly. COPD. There is decrease in pulmonary vascular congestion and decrease in interstitial pulmonary edema. Small bilateral pleural effusions. Electronically Signed   By: Elmer Picker M.D.   On: 04/26/2021 11:33     Scheduled Meds:  apixaban  2.5 mg Oral BID   bisacodyl  10 mg Rectal Once   carvedilol  6.25 mg Oral BID WC   Chlorhexidine Gluconate Cloth  6 each Topical Daily   dextromethorphan-guaiFENesin  1 tablet Oral BID   digoxin  0.125 mg Oral Once per day on Mon Tue Wed Thu Fri   furosemide  20 mg Intravenous Daily   hydrALAZINE  50 mg Oral BID   isosorbide mononitrate  30 mg Oral Daily   levothyroxine  88 mcg Oral QAC breakfast   lisinopril  10 mg Oral Daily   loratadine  10 mg Oral q morning   polyethylene glycol  17 g Oral Daily   polyvinyl alcohol  1 drop Both Eyes TID   sodium chloride flush  3 mL Intravenous Q12H   sodium chloride flush  3 mL Intravenous Q12H   Continuous Infusions:  sodium chloride       LOS: 3 days    Roxan Hockey M.D on 04/27/2021 at 7:10 PM  Go  to [email or web address] - for contact info  [facility] - Office  [phone]  If 7PM-7AM, please contact night-coverage [email or web address] Password [email or web address] 04/27/2021, 7:10 PM

## 2021-04-27 NOTE — Care Management Important Message (Signed)
Important Message  Patient Details  Name: [name] MRN: [id number] Date of Birth: 1924-09-02   Medicare Important Message Given:  Yes     Tommy Medal 04/27/2021, 1:29 PM

## 2021-04-27 NOTE — TOC Transition Note (Addendum)
Transition of Care [facility]) - CM/SW Discharge Note   Patient Details  Name: [name] MRN: [id number] Date of Birth: Jul 18, 1924  [facility] CM/SW Contact:  Boneta Lucks, RN Phone Number: 04/27/2021, 1:31 PM   Clinical Narrative:   Patient discharging home, Home health orders placed, [facility] with [facility]. Patient needs home oxygen, Caryl Pina with [facility] accepted the referral.   Patient says she has LTC insurance. TOC explained to patient and family, she does not qualify for rehab, she walked 150 feet. She lives alone, if they feel she needs more care they can check with PCP and policy to see what it will cover. May cover an Aide.    Final next level of care: [place] Barriers to Discharge: Barriers Resolved  Patient Goals and CMS Choice Patient states their goals for this hospitalization and ongoing recovery are:: Return home CMS Medicare.gov Compare Post Acute Care list provided to:: Patient Choice offered to / list presented to : Patient  Discharge Placement              Patient to be transferred to facility by: Family   Patient and family notified of of transfer: 04/27/21  Discharge Plan and Services In-house Referral: Clinical Social Work Discharge Planning Services: CM Consult                Readmission Risk Interventions Readmission Risk Prevention Plan 04/25/2021  Transportation Screening Complete  HRI or Home Care Consult Complete  Social Work Consult for [place] Planning/Counseling Complete  Palliative Care Screening Not Applicable  Medication Review Press photographer) Complete  Some recent data might be hidden

## 2021-04-27 NOTE — Plan of Care (Signed)
  Problem: Education: Goal: Ability to demonstrate management of disease process will improve Outcome: Progressing Goal: Ability to verbalize understanding of medication therapies will improve Outcome: Progressing   

## 2021-04-27 NOTE — Progress Notes (Signed)
Physical Therapy Treatment Patient Details Name: [name] MRN: [id number] DOB: March 15, 1925 Today's Date: 04/27/2021   History of Present Illness [name]  is a 88 y.o. female who presents with worsening dyspnea and is found to be hypoxic in the ED. Additional history obtained from patient's nephew/POA Dr Corey Skains  -Apparently patient does not have any respiratory symptoms of shortness of breath for several weeks she was diagnosed with pneumonia back in December 2022, treated with antibiotics over the last couple weeks she was given additional steroids and antibiotics by PCP but symptoms persisted. PMH: HTN, PAFib/SSS, s/p PPM , CAD, chronic combined systolic and diastolic dysfunction CHF and hearing loss who presents to the ED with worsening dyspnea and is found to be hypoxic in the ED    PT Comments    Pt ambulates 150 ft in hallway with RW, step through pattern, no LOB with turns and direction changes. Pt then ambulates in room with RW, navigates restroom with increased time and steps, able to complete toileting with supv for safety. Therapist managing O2 tubing and tank throughout treatment session, cued pt for pursed lip breathing at rest and with mobility; pt desats to 86% on RA, 89% on 2L and SpO2 90% on 3L- RN notified. Pt fatigues requiring seated rest breaks as needed to recover. Pt in recliner with family in room at [place], on 3L O2 with SpO2 90%.   Recommendations for follow up therapy are one component of a multi-disciplinary discharge planning process, led by the attending physician.  Recommendations may be updated based on patient status, additional functional criteria and insurance authorization.  Follow Up Recommendations  Home health PT     Assistance Recommended at Discharge Set up Supervision/Assistance  Patient can return home with the following A little help with walking and/or transfers;A little help with bathing/dressing/bathroom;Help with stairs or ramp for  entrance;Assistance with cooking/housework   Equipment Recommendations  Rolling walker (2 wheels)    Recommendations for Other Services       Precautions / Restrictions Precautions Precautions: Fall Precaution Comments: monitor O2 Restrictions Weight Bearing Restrictions: No     Mobility  Bed Mobility Overal bed mobility: Modified Independent   General bed mobility comments: slightly increased time    Transfers Overall transfer level: Needs assistance Equipment used: Rolling walker (2 wheels) Transfers: Sit to/from Stand Sit to Stand: Supervision  General transfer comment: supv to power to stand with BUE assisting, therapist managing O2 tubing    Ambulation/Gait Ambulation/Gait assistance: Min guard Gait Distance (Feet): 150 Feet Assistive device: Rolling walker (2 wheels) Gait Pattern/deviations: Step-through pattern, Decreased stride length Gait velocity: decreased  General Gait Details: step through pattern with RW without LOB, able to complete to complete turns and direction changes with increased time and no LOB, dyspnea 3/4 on 2L with SpO2 89% and 3L SpO2 90%, cued for pursed lip breathing   Stairs             Wheelchair Mobility    Modified Rankin (Stroke Patients Only)       Balance Overall balance assessment: Needs assistance Sitting-balance support: Feet supported, No upper extremity supported Sitting balance-Leahy Scale: Good Sitting balance - Comments: seated at EOB   Standing balance support: During functional activity, No upper extremity supported, Reliant on assistive device for balance Standing balance-Leahy Scale: Poor     Cognition Arousal/Alertness: Awake/alert Behavior During Therapy: WFL for tasks assessed/performed Overall Cognitive Status: Within Functional Limits for tasks assessed     Exercises  General Comments General comments (skin integrity, edema, etc.): Pt desats to 86% on RA, SpO2 89% on 2L and SpO2 90% on 3L  with mobility- RN notified      Pertinent Vitals/Pain Pain Assessment Pain Assessment: No/denies pain    Home Living                          Prior Function            PT Goals (current goals can now be found in the care plan section) Acute Rehab PT Goals Patient Stated Goal: return home with boyfriend to assist PT Goal Formulation: With patient/family Time For Goal Achievement: 04/29/21 Potential to Achieve Goals: Good Progress towards PT goals: Progressing toward goals    Frequency    Min 3X/week      PT Plan Current plan remains appropriate    Co-evaluation              AM-PAC PT "6 Clicks" Mobility   Outcome Measure  Help needed turning from your back to your side while in a flat bed without using bedrails?: None Help needed moving from lying on your back to sitting on the side of a flat bed without using bedrails?: None Help needed moving to and from a bed to a chair (including a wheelchair)?: A Little Help needed standing up from a chair using your arms (e.g., wheelchair or bedside chair)?: A Little Help needed to walk in hospital room?: A Little Help needed climbing 3-5 steps with a railing? : A Lot 6 Click Score: 19    End of Session Equipment Utilized During Treatment: Oxygen Activity Tolerance: Patient tolerated treatment well;Patient limited by fatigue Patient left: in chair;with call bell/phone within reach;with family/visitor present Nurse Communication: Mobility status;Other (comment) (SpO2) PT Visit Diagnosis: Unsteadiness on feet (R26.81);Other abnormalities of gait and mobility (R26.89);Muscle weakness (generalized) (M62.81)     Time: 7628-3151 PT Time Calculation (min) (ACUTE ONLY): 36 min  Charges:  $Gait Training: 8-22 mins $Therapeutic Activity: 8-22 mins                      [name] PT, DPT 04/27/21, 10:31 AM

## 2021-04-27 NOTE — TOC Progression Note (Signed)
Transition of Care [facility]) - Progression Note    Patient Details  Name: [name] MRN: [id number] Date of Birth: 1924/08/09  [facility]) CM/SW Contact  Boneta Lucks, RN Phone Number: 04/27/2021, 4:38 PM  Clinical Narrative:    TOC had not sent out FL2 due to conversation yesterday with family agreeing to Home health. Health was set up with [facility]. Walker delivered by [employer]. Patient walked 150 feet with PT this morning. POA went to [facility] for bed discussion. [facility] calling to make a bed offer. TOC completed PASSR, FL2 and started INS AUTH. MD/RN/POA updated.   Expected Discharge Plan: Skilled Nursing Facility Barriers to Discharge: Insurance Authorization  Expected Discharge Plan and Services Expected Discharge Plan: [place] In-house Referral: Clinical Social Work Discharge Planning Services: CM Consult   Living arrangements for the past 2 months: Single Family Home Expected Discharge Date: 04/27/21                     Readmission Risk Interventions Readmission Risk Prevention Plan 04/25/2021  Transportation Screening Complete  HRI or Home Care Consult Complete  Social Work Consult for [place] Planning/Counseling Complete  Palliative Care Screening Not Applicable  Medication Review Press photographer) Complete  Some recent data might be hidden

## 2021-04-27 NOTE — Plan of Care (Signed)
?  Problem: Education: ?Goal: Ability to verbalize understanding of medication therapies will improve ?Outcome: Progressing ?  ?Problem: Education: ?Goal: Knowledge of General Education information will improve ?Description: Including pain rating scale, medication(s)/side effects and non-pharmacologic comfort measures ?Outcome: Progressing ?  ?

## 2021-04-27 NOTE — NC FL2 (Signed)
[place] LEVEL OF CARE SCREENING TOOL     IDENTIFICATION  Patient Name: [name] Birthdate: October 01, 1924 Sex: female Admission Date (Current Location): 04/24/2021  [facility] and [place] Number:  [employer] and Address:  [place] [address] [place]      Provider Number: [id number]  Attending Physician Name and Address:  Roxan Hockey, MD  Relative Name and Phone Number:  Baker,Dr Eston Mould     [phone] [facility]    Current Level of Care:   Recommended Level of Care: [place] Prior Approval Number:    Date Approved/Denied:   PASRR Number: [id number]  Discharge Plan: SNF    Current Diagnoses: Patient Active Problem List   Diagnosis Date Noted   Acute exacerbation of CHF (congestive heart failure) ([place]) 04/24/2021   Acute on chronic combined systolic and diastolic CHF (congestive heart failure) ([place]) 04/24/2021   Hypertensive emergency 04/24/2021   SOB (shortness of breath) 12/26/2014   Cardiac pacemaker in situ 09/30/2013   Benign essential HTN 09/30/2013   Plantar fasciitis of left foot 06/21/2011    Orientation RESPIRATION BLADDER Height & Weight     Self, Time, Situation, Place  O2 (2L) Continent Weight: 45.1 kg Height:  5\' 3"  (160 cm)  BEHAVIORAL SYMPTOMS/MOOD NEUROLOGICAL BOWEL NUTRITION STATUS      Continent Diet (See DC Summary)  AMBULATORY STATUS COMMUNICATION OF NEEDS Skin   Limited Assist Verbally Normal                       Personal Care Assistance Level of Assistance  Bathing, Dressing, Feeding Bathing Assistance: Limited assistance Feeding assistance: Independent Dressing Assistance: Limited assistance     Functional Limitations Info  Sight, Hearing, Speech Sight Info: Impaired Hearing Info: Impaired Speech Info: Adequate    SPECIAL CARE FACTORS FREQUENCY                       Contractures Contractures Info: Not present    Additional Factors  Info  Code Status, Allergies Code Status Info: DNR Allergies Info: Macrodantin, Sulfa           Current Medications (04/27/2021):  This is the current hospital active medication list Current Facility-Administered Medications  Medication Dose Route Frequency Provider Last Rate Last Admin   0.9 %  sodium chloride infusion  250 mL Intravenous PRN Emokpae, Courage, MD       acetaminophen (TYLENOL) tablet 650 mg  650 mg Oral Q6H PRN Emokpae, Courage, MD       Or   acetaminophen (TYLENOL) suppository 650 mg  650 mg Rectal Q6H PRN Emokpae, Courage, MD       albuterol (PROVENTIL) (2.5 MG/3ML) 0.083% nebulizer solution 2.5 mg  2.5 mg Nebulization Q2H PRN Emokpae, Courage, MD   2.5 mg at 04/27/21 2637   ALPRAZolam (XANAX) tablet 0.5 mg  0.5 mg Oral TID PRN Roxan Hockey, MD   0.5 mg at 04/25/21 2037   apixaban (ELIQUIS) tablet 2.5 mg  2.5 mg Oral BID Emokpae, Courage, MD   2.5 mg at 04/27/21 1040   bisacodyl (DULCOLAX) suppository 10 mg  10 mg Rectal Daily PRN Roxan Hockey, MD       bisacodyl (DULCOLAX) suppository 10 mg  10 mg Rectal Once Emokpae, Courage, MD       carvedilol (COREG) tablet 6.25 mg  6.25 mg Oral BID WC Emokpae, Courage, MD   6.25 mg at 04/27/21 0840  cefTRIAXone (ROCEPHIN) 1 g in sodium chloride 0.9 % 100 mL IVPB  1 g Intravenous Once Emokpae, Courage, MD       Chlorhexidine Gluconate Cloth 2 % PADS 6 each  6 each Topical Daily Emokpae, Courage, MD   6 each at 04/27/21 1047   dextromethorphan-guaiFENesin (MUCINEX DM) 30-600 MG per 12 hr tablet 1 tablet  1 tablet Oral BID Roxan Hockey, MD   1 tablet at 04/27/21 1039   digoxin (LANOXIN) tablet 0.125 mg  0.125 mg Oral Once per day on Mon Tue Wed Thu Fri Emokpae, Courage, MD   0.125 mg at 04/27/21 1046   furosemide (LASIX) injection 20 mg  20 mg Intravenous Daily Emokpae, Courage, MD   20 mg at 04/27/21 1041   hydrALAZINE (APRESOLINE) injection 10 mg  10 mg Intravenous Q4H PRN Roxan Hockey, MD   10 mg at 04/25/21 0315    hydrALAZINE (APRESOLINE) tablet 50 mg  50 mg Oral BID Emokpae, Courage, MD   50 mg at 04/27/21 1039   isosorbide mononitrate (IMDUR) 24 hr tablet 30 mg  30 mg Oral Daily Emokpae, Courage, MD   30 mg at 04/27/21 1040   levothyroxine (SYNTHROID) tablet 88 mcg  88 mcg Oral QAC breakfast Emokpae, Courage, MD   88 mcg at 04/27/21 0553   lisinopril (ZESTRIL) tablet 10 mg  10 mg Oral Daily Emokpae, Courage, MD   10 mg at 04/27/21 1040   loratadine (CLARITIN) tablet 10 mg  10 mg Oral q morning Emokpae, Courage, MD   10 mg at 04/27/21 1046   ondansetron (ZOFRAN) tablet 4 mg  4 mg Oral Q6H PRN Emokpae, Courage, MD       Or   ondansetron (ZOFRAN) injection 4 mg  4 mg Intravenous Q6H PRN Emokpae, Courage, MD   4 mg at 04/25/21 1455   polyethylene glycol (MIRALAX / GLYCOLAX) packet 17 g  17 g Oral Daily PRN Emokpae, Courage, MD       polyethylene glycol (MIRALAX / GLYCOLAX) packet 17 g  17 g Oral Daily Emokpae, Courage, MD   17 g at 04/27/21 1040   polyvinyl alcohol (LIQUIFILM TEARS) 1.4 % ophthalmic solution 1 drop  1 drop Both Eyes TID Emokpae, Courage, MD   1 drop at 04/27/21 1039   sodium chloride flush (NS) 0.9 % injection 3 mL  3 mL Intravenous Q12H Emokpae, Courage, MD   3 mL at 04/27/21 1043   sodium chloride flush (NS) 0.9 % injection 3 mL  3 mL Intravenous Q12H Emokpae, Courage, MD   3 mL at 04/27/21 1043   sodium chloride flush (NS) 0.9 % injection 3 mL  3 mL Intravenous PRN Emokpae, Courage, MD       traZODone (DESYREL) tablet 50 mg  50 mg Oral QHS PRN Roxan Hockey, MD         Discharge Medications: Please see discharge summary for a list of discharge medications.  Relevant Imaging Results:  Relevant Lab Results:   Additional Information SS# [id number]  Boneta Lucks, RN

## 2021-04-28 ENCOUNTER — Inpatient Hospital Stay
Admission: RE | Admit: 2021-04-28 | Discharge: 2021-11-16 | Disposition: A | Discharge status: Nursing Home (Includes ICF) | Payer: Medicare Other | Source: Ambulatory Visit | Attending: Internal Medicine | Admitting: Internal Medicine

## 2021-04-28 LAB — PROCALCITONIN: Procalcitonin: 1.34 ng/mL

## 2021-04-28 LAB — RENAL FUNCTION PANEL
Albumin: 2.8 g/dL — ABNORMAL LOW (ref 3.5–5.0)
Anion gap: 9 (ref 5–15)
BUN: 39 mg/dL — ABNORMAL HIGH (ref 8–23)
CO2: 35 mmol/L — ABNORMAL HIGH (ref 22–32)
Calcium: 9.1 mg/dL (ref 8.9–10.3)
Chloride: 90 mmol/L — ABNORMAL LOW (ref 98–111)
Creatinine, Ser: 1.2 mg/dL — ABNORMAL HIGH (ref 0.44–1.00)
GFR, Estimated: 42 mL/min — ABNORMAL LOW (ref >60)
Glucose, Bld: 112 mg/dL — ABNORMAL HIGH (ref 70–99)
Phosphorus: 2.9 mg/dL (ref 2.5–4.6)
Potassium: 3.1 mmol/L — ABNORMAL LOW (ref 3.5–5.1)
Sodium: 134 mmol/L — ABNORMAL LOW (ref 135–145)

## 2021-04-28 LAB — GLUCOSE, CAPILLARY
Glucose-Capillary: 105 mg/dL — ABNORMAL HIGH (ref 70–99)
Glucose-Capillary: 115 mg/dL — ABNORMAL HIGH (ref 70–99)
Glucose-Capillary: 224 mg/dL — ABNORMAL HIGH (ref 70–99)

## 2021-04-28 LAB — CBC
HCT: 38.9 % (ref 36.0–46.0)
Hemoglobin: 12.1 g/dL (ref 12.0–15.0)
MCH: 29.9 pg (ref 26.0–34.0)
MCHC: 31.1 g/dL (ref 30.0–36.0)
MCV: 96 fL (ref 80.0–100.0)
Platelets: 197 10*3/uL (ref 150–400)
RBC: 4.05 MIL/uL (ref 3.87–5.11)
RDW: 14.1 % (ref 11.5–15.5)
WBC: 19.1 10*3/uL — ABNORMAL HIGH (ref 4.0–10.5)
nRBC: 0 % (ref 0.0–0.2)

## 2021-04-28 LAB — SEDIMENTATION RATE: Sed Rate: 47 mm/hr — ABNORMAL HIGH (ref 0–22)

## 2021-04-28 LAB — C-REACTIVE PROTEIN: CRP: 17.8 mg/dL — ABNORMAL HIGH (ref <1.0)

## 2021-04-28 MED ORDER — ACETAMINOPHEN 325 MG PO TABS: 650.0000 mg | ORAL_TABLET | Freq: Four times a day (QID) | ORAL | 0 refills | Status: AC | PRN

## 2021-04-28 MED ORDER — CIPROFLOXACIN HCL 500 MG PO TABS: 500.0000 mg | ORAL_TABLET | Freq: Two times a day (BID) | ORAL | 0 refills | Status: AC

## 2021-04-28 MED ORDER — CARVEDILOL 6.25 MG PO TABS: 6.2500 mg | ORAL_TABLET | Freq: Two times a day (BID) | ORAL | 1 refills | Status: DC

## 2021-04-28 MED ORDER — HYDRALAZINE HCL 100 MG PO TABS: 100.0000 mg | ORAL_TABLET | Freq: Two times a day (BID) | ORAL | 1 refills | Status: DC

## 2021-04-28 MED ORDER — ALPRAZOLAM 0.5 MG PO TABS: 0.5000 mg | ORAL_TABLET | Freq: Three times a day (TID) | ORAL | 0 refills | Status: DC | PRN

## 2021-04-28 MED ORDER — LACTINEX PO CHEW: 1.0000 | CHEWABLE_TABLET | Freq: Three times a day (TID) | ORAL | 0 refills | Status: DC

## 2021-04-28 MED ORDER — LISINOPRIL 10 MG PO TABS: 20.0000 mg | ORAL_TABLET | Freq: Every day | ORAL | Status: DC

## 2021-04-28 MED ORDER — CIPROFLOXACIN HCL 250 MG PO TABS: 500.0000 mg | ORAL_TABLET | ORAL | Status: DC

## 2021-04-28 MED ORDER — LISINOPRIL 20 MG PO TABS: 20.0000 mg | ORAL_TABLET | Freq: Every day | ORAL | 1 refills | Status: DC

## 2021-04-28 MED ORDER — HYDRALAZINE HCL 25 MG PO TABS: 100.0000 mg | ORAL_TABLET | Freq: Two times a day (BID) | ORAL | Status: DC

## 2021-04-28 MED ORDER — ISOSORBIDE MONONITRATE ER 30 MG PO TB24: 30.0000 mg | ORAL_TABLET | Freq: Every day | ORAL | 1 refills | Status: DC

## 2021-04-28 NOTE — TOC Transition Note (Signed)
Transition of Care [facility]) - CM/SW Discharge Note   Patient Details  Name: [name] MRN: [id number] Date of Birth: [birth year]  [facility]) CM/SW Contact:  Boneta Lucks, RN Phone Number: 04/28/2021, 1:37 PM   Clinical Narrative:  Insurance called for a Peer to Peer. MD completed. Patient was denied SNF due to good mobility. TOC spoke with POA, they have not confirmed help in the home. He discussed with family and they all agreed it was best to private pay [facility] for rehab. TOC spoke with Marianna Fuss. They also spoke with POA and worked out the details for patient to go to [facility] today.  MD updated, discharge summary completed and sent to [facility]. RN to call report.   Final next level of care: Skilled Nursing Facility Barriers to Discharge: Barriers Resolved   Patient Goals and CMS Choice Patient states their goals for this hospitalization and ongoing recovery are:: wants SNF CMS Medicare.gov Compare Post Acute Care list provided to:: Patient Choice offered to / list presented to : [facility] POA / [place]  Discharge Placement              Patient [facility] bed at:  [facility]) Patient to be transferred to facility by: [facility] staff Name of family member notified: Dr. Luana Shu Patient and family notified of of transfer: 04/28/21  Discharge Plan and Services In-house Referral: Clinical Social Work Discharge Planning Services: CM Consult              Readmission Risk Interventions Readmission Risk Prevention Plan 04/25/2021  Transportation Screening Complete  HRI or Home Care Consult Complete  Social Work Consult for [place] Planning/Counseling Complete  Palliative Care Screening Not Applicable  Medication Review Press photographer) Complete  Some recent data might be hidden

## 2021-04-28 NOTE — Discharge Summary (Signed)
Physician Discharge Summary [facility]    Patient: [name]                   Admit date: 04/24/2021   DOB: [birth year]             Discharge date:04/28/2021/12:00 PM PJA:[id number]                          PCP: [name] Sites, MD  Disposition:   SNF   Recommendations for Outpatient Follow-up:   Follow up: With PCP and cardiologist within 1 to 2 weeks Current medication for better blood pressure control, heart failure control is to be adjusted Daily weight Weekly labs BMP -results to cardiology Discharge Condition: Stable   Code Status:   Code Status: DNR  Diet recommendation: Cardiac diet   Discharge Diagnoses:    Principal Problem:   Acute on chronic combined systolic and diastolic CHF (congestive heart failure) ([place]) Active Problems:   Cardiac pacemaker in situ   Hypertensive emergency   History of Present Illness/ Hospital Course [name] Summary:   88 y.o. female with h/o HTN, PAFib/SSS, s/p PPM , CAD, chronic combined systolic and diastolic dysfunction CHF and hearing loss admitted with Acute on chronic combined systolic and diastolic dysfunction CHF exacerbation resulting in hypoxic respiratory failure-on 04/24/2021  Improved with Diuresis , awaiting insurance approval for transfer to SNF rehab      1)Acute on chronic combined systolic and diastolic dysfunction CHF exacerbation resulting in hypoxic respiratory failure- - initial troponin at 33, repeat 39 -BNP 2877 -Echo with EF of 20 to 25% with global hypokinesis, and grade 1 diastolic dysfunction -c/n IV Lasix as ordered, = Fluid input and output monitoring and Daily weights Dyspnea and Hypoxia improved, voiding improved -Repeat chest x-ray on 04/26/2021 showed improved CHF -Standing scale weight is down to 99 pounds, patient weighed over 111 pounds this admission -At baseline weight is usually around 106 pounds   2)Social/Ethics-- While in ED RN Vaughan Basta Long at bedside and ED paramedic at bedside,  patient requesting DNR/DNI status, patient verbalizes understanding of DNR status and implications -Please see separate documentation from ED RN Lyn Henri -Advanced directives and plan of care discussed with patient's nephew/POA Dr Corey Skains---  -Patient and HCPOA request DNR/DNI status -PTA patient lived alone, she still drives locally in town   3) acute hypoxic respiratory failure secondary to #1 above--- continue supplemental oxygen  -See #1 above -Patient will most likely need oxygen upon discharge, currently requiring 2 L via nasal cannula with activity   4)Lactic Acidosis-lactic acid is 2.2, suspect this is secondary to hypoxia rather than frank sepsis -Repeat lactic acid 1.7   5) leukocytosis--- may be partly due to recent repeated doses of steroids -Repeat chest x-ray without evidence of pneumonia -Continue IV Rocephin for E. coli UTI -Persistent leukocytosis, prolonged antibiotic, has been switched to ciprofloxacin for 3 more days   6) E. coli UTI--treated with IV Rocephin, p.o. ciprofloxacin   7) PAFib/SSS, s/p PPM -----Dig level was low therapeutic at 0.9, continue digoxin  -Continue Eliquis for stroke prophylaxis   8) hypertensive emergency--- resulting in congestive heart failure with hypoxia ---BP improved on IV nitro drip, weaned off nitro drip -Continue Coreg,  lisinopril, hydralazine and isosorbide   -Need to follow-up with cardiology titrate medication for better BP control  9) generalized weakness and deconditioning--PT eval appreciated recommends home health PT, Patient and family requested SNF rehab --Insurance denied patient  due to mobility -TOC has discussed with POA, family and SNF facility     Disposition/Need for in-Hospital Stay-  SNF-   Status is: Inpatient      Dispo: The patient is from: Home              Anticipated d/c is to:   SNF rehab  Peer to peer review was completed with patient's insurance..  Did not get approved due to mobility.      Discharge Instructions:   Discharge Instructions     (HEART FAILURE PATIENTS) Call MD:  Anytime you have any of the following symptoms: 1) 3 pound weight gain in 24 hours or 5 pounds in 1 week 2) shortness of breath, with or without a dry hacking cough 3) swelling in the hands, feet or stomach 4) if you have to sleep on extra pillows at night in order to breathe.   Complete by: As directed    AMB referral to CHF clinic   Complete by: As directed    Activity as tolerated - No restrictions   Complete by: As directed    Call MD for:  difficulty breathing, headache or visual disturbances   Complete by: As directed    Call MD for:  redness, tenderness, or signs of infection (pain, swelling, redness, odor or green/yellow discharge around incision site)   Complete by: As directed    Call MD for:  temperature >100.4   Complete by: As directed    Diet - low sodium heart healthy   Complete by: As directed    Discharge instructions   Complete by: As directed    Please F/up with cardiology, PCP within the next 1 to 2 weeks Current medication needs to be adjusted for better blood pressure control Complete current antibiotics   Increase activity slowly   Complete by: As directed         Medication List     STOP taking these medications    amoxicillin-clavulanate 875-125 MG tablet Commonly known as: AUGMENTIN   azithromycin 250 MG tablet Commonly known as: Zithromax Z-Pak   Calcium Carbonate-Vitamin D 600-400 MG-UNIT chew tablet   metoprolol succinate 50 MG 24 hr tablet Commonly known as: TOPROL-XL   predniSONE 10 MG tablet Commonly known as: DELTASONE       TAKE these medications    acetaminophen 325 MG tablet Commonly known as: TYLENOL Take 2 tablets (650 mg total) by mouth every 6 (six) hours as needed for mild pain (or Fever >/= 101).   ALPRAZolam 0.5 MG tablet Commonly known as: XANAX Take 1 tablet (0.5 mg total) by mouth 3 (three) times daily as needed.    apixaban 2.5 MG Tabs tablet Commonly known as: Eliquis Take 1 tablet (2.5 mg total) by mouth 2 (two) times daily.   carvedilol 6.25 MG tablet Commonly known as: COREG Take 1 tablet (6.25 mg total) by mouth 2 (two) times daily with a meal.   ciprofloxacin 500 MG tablet Commonly known as: CIPRO Take 1 tablet (500 mg total) by mouth 2 (two) times daily for 3 days.   diclofenac Sodium 1 % Gel Commonly known as: VOLTAREN Apply 2 g topically 4 (four) times daily.   digoxin 0.125 MG tablet Commonly known as: LANOXIN Take one tablet on Monday - Friday and None on Saturday or Sunday   ergocalciferol 1.25 MG (50000 UT) capsule Commonly known as: VITAMIN D2 Take 50,000 Units by mouth once a week.   Fish Oil 1000 MG Caps Take  1,000 mg by mouth daily.   furosemide 20 MG tablet Commonly known as: LASIX Take 20 mg by mouth 2 (two) times a week.   hydrALAZINE 100 MG tablet Commonly known as: APRESOLINE Take 1 tablet (100 mg total) by mouth 2 (two) times daily.   ipratropium 0.03 % nasal spray Commonly known as: ATROVENT ipratropium bromide 21 mcg (0.03 %) nasal spray   isosorbide mononitrate 30 MG 24 hr tablet Commonly known as: IMDUR Take 1 tablet (30 mg total) by mouth daily. Start taking on: April 29, 2021   lactobacillus acidophilus & bulgar chewable tablet Chew 1 tablet by mouth 3 (three) times daily with meals for 5 days.   levocetirizine 5 MG tablet Commonly known as: XYZAL Take 5 mg by mouth every morning.   levothyroxine 88 MCG tablet Commonly known as: SYNTHROID Take 88 mcg by mouth daily before breakfast.   lisinopril 20 MG tablet Commonly known as: ZESTRIL Take 1 tablet (20 mg total) by mouth daily. Start taking on: April 29, 2021 What changed:  medication strength See the new instructions.   Potassium 99 MG Tabs Take 1 tablet by mouth 2 (two) times daily.   PRESERVISION AREDS PO Take 1 tablet by mouth 2 (two) times daily.   SYSTANE OP Place 1  drop into both eyes 3 (three) times daily.   Ventolin HFA 108 (90 Base) MCG/ACT inhaler Generic drug: albuterol Inhale 2 puffs into the lungs every 4 (four) hours as needed for wheezing or shortness of breath.   vitamin C 500 MG tablet Commonly known as: ASCORBIC ACID Take 500 mg by mouth every morning.        Contact information for follow-up providers     [facility] Follow up.   Why: Home oxygen Contact information: [place] [address] [place], [facility] Follow up.   Specialty: Home Health Services Why: They will call to schedule your first home visit.             Contact information for after-discharge care     [facility] .   Service: Skilled Nursing Contact information: [address] [address] [phone]                    Allergies  Allergen Reactions   Macrodantin Shortness Of Breath   Sulfa Antibiotics Shortness Of Breath     Quit smoking:  It is highly recommended that all people especially deals with diabetes to quit smoking or stay away from smoking, avoid secondhand smoking.   Vaccines:  Also highly recommended update your vaccines requirement, including SARS-CoV-2 , yearly  flu vaccine and pneumonia vaccine at least every 5 years.      Exercise: If you are able: 30 -60 minutes a day, 4 days a week, or 150 minutes a week.  The longer the better.  Combine stretch, strength, and aerobic activities.  If you were told in the past that you have high risk for cardiovascular diseases, you may seek evaluation by your heart doctor prior to initiating moderate to intense exercise programs.    One other important lifestyle recommendation is to ensure adequate sleep - at least 6-7 hours of uninterrupted sleep at night.  Procedures /Studies:   DG Chest 2 View  Result Date: 04/26/2021 CLINICAL DATA:   Shortness of breath EXAM: CHEST - 2 VIEW COMPARISON:  Previous studies including the examination of 04/24/2021 FINDINGS: Transverse diameter of heart is increased. Central pulmonary vessels are less prominent. There is decrease in interstitial markings in the parahilar regions. There are no new focal infiltrates. There is blunting of both lateral CP angles. Low position of diaphragms suggests COPD. There is no pneumothorax. Pacemaker battery is seen in the left infraclavicular region. IMPRESSION: Cardiomegaly. COPD. There is decrease in pulmonary vascular congestion and decrease in interstitial pulmonary edema. Small bilateral pleural effusions. Electronically Signed   By: Elmer Picker M.D.   On: 04/26/2021 11:33   DG Chest Port 1 View  Result Date: 04/24/2021 CLINICAL DATA:  88 year old female with possible sepsis. Productive cough since December. EXAM: PORTABLE CHEST 1 VIEW COMPARISON:  Chest x-ray 03/25/2021. FINDINGS: There is cephalization of the pulmonary vasculature and slight indistinctness of the interstitial markings suggestive of mild pulmonary edema. Small bilateral pleural effusions mild cardiomegaly. Upper mediastinal contours are within normal limits. Atherosclerotic calcifications in the thoracic aorta. Left-sided pacemaker device in place with lead tips projecting over the expected location of the right atrium and right ventricle. IMPRESSION: 1. The appearance the chest suggests mild congestive heart failure, as above. 2. Aortic atherosclerosis. Electronically Signed   By: Vinnie Langton M.D.   On: 04/24/2021 06:28   ECHOCARDIOGRAM COMPLETE  Result Date: 04/24/2021    ECHOCARDIOGRAM REPORT   Patient Name:   [name] Date of Exam: 04/24/2021 Medical Rec #:  [id number]     Height:       63.0 in Accession #:    [id number]    Weight:       115.0 lb Date of Birth:  [birth year]      BSA:          1.528 m Patient Age:    63 years      BP:           137/64 mmHg Patient Gender: F              HR:           78 bpm. Exam Location:  [facility] Procedure: 2D Echo, Cardiac Doppler and Color Doppler Indications:    CHF-Acute Diastolic U54.27  History:        Patient has prior history of Echocardiogram examinations, most                 recent 12/30/2014. CHF and Cardiomyopathy, CAD, Arrythmias:Atrial                 Fibrillation, Signs/Symptoms:Dyspnea and Shortness of Breath;                 Risk Factors:Hypertension.  Sonographer:    Bernadene Person RDCS Referring Phys: [place]  1. Left ventricular ejection fraction, by estimation, is 20 to 25%. The left ventricle has severely decreased function. The left ventricle demonstrates global hypokinesis. Left ventricular diastolic parameters are consistent with Grade I diastolic dysfunction (impaired relaxation).  2. Right ventricular systolic function is normal. The right ventricular size is normal. There is normal pulmonary artery systolic pressure.  3. Left atrial size was mildly dilated.  4. A small pericardial effusion is present. The pericardial effusion is circumferential.  5. The mitral valve is abnormal. Mild mitral valve regurgitation. No evidence of mitral stenosis.  6. The aortic valve was not well visualized. There is moderate calcification of the aortic valve. There is moderate thickening of the aortic valve. Aortic valve regurgitation is not visualized. No aortic  stenosis is present.  7. The inferior vena cava is dilated in size with <50% respiratory variability, suggesting right atrial pressure of 15 mmHg. FINDINGS  Left Ventricle: Global hypokinesis. The anteroseptal, anterior, anterolateral walls are more severely hypokinetic. The apex is akinetic. Left ventricular ejection fraction, by estimation, is 20 to 25%. The left ventricle has severely decreased function.  The left ventricle demonstrates global hypokinesis. The left ventricular internal cavity size was normal in size. There is no left ventricular hypertrophy. Left  ventricular diastolic parameters are consistent with Grade I diastolic dysfunction (impaired  relaxation). Indeterminate filling pressures. Right Ventricle: The right ventricular size is normal. Right vetricular wall thickness was not well visualized. Right ventricular systolic function is normal. There is normal pulmonary artery systolic pressure. The tricuspid regurgitant velocity is 2.21 m/s, and with an assumed right atrial pressure of 8 mmHg, the estimated right ventricular systolic pressure is 85.0 mmHg. Left Atrium: Left atrial size was mildly dilated. Right Atrium: Right atrial size was not well visualized. Pericardium: A small pericardial effusion is present. The pericardial effusion is circumferential. Mitral Valve: The mitral valve is abnormal. There is mild thickening of the mitral valve leaflet(s). There is mild calcification of the mitral valve leaflet(s). Mild mitral annular calcification. Mild mitral valve regurgitation. No evidence of mitral valve stenosis. Tricuspid Valve: The tricuspid valve is not well visualized. Tricuspid valve regurgitation is mild . No evidence of tricuspid stenosis. Aortic Valve: The aortic valve was not well visualized. There is moderate calcification of the aortic valve. There is moderate thickening of the aortic valve. There is moderate aortic valve annular calcification. Aortic valve regurgitation is not visualized. No aortic stenosis is present. Aortic valve mean gradient measures 3.5 mmHg. Aortic valve peak gradient measures 8.7 mmHg. Aortic valve area, by VTI measures 2.36 cm. Pulmonic Valve: The pulmonic valve was not well visualized. Pulmonic valve regurgitation is not visualized. No evidence of pulmonic stenosis. Aorta: The aortic root is normal in size and structure. Venous: The inferior vena cava is dilated in size with less than 50% respiratory variability, suggesting right atrial pressure of 15 mmHg. IAS/Shunts: The interatrial septum was not well visualized.   LEFT VENTRICLE PLAX 2D LVIDd:         5.70 cm      Diastology LVIDs:         4.40 cm      LV e' medial:    2.48 cm/s LV PW:         1.00 cm      LV E/e' medial:  18.7 LV IVS:        0.90 cm      LV e' lateral:   4.45 cm/s LVOT diam:     2.00 cm      LV E/e' lateral: 10.4 LV SV:         49 LV SV Index:   32 LVOT Area:     3.14 cm  LV Volumes (MOD) LV vol d, MOD A2C: 136.0 ml LV vol d, MOD A4C: 122.0 ml LV vol s, MOD A2C: 102.0 ml LV vol s, MOD A4C: 88.3 ml LV SV MOD A2C:     34.0 ml LV SV MOD A4C:     122.0 ml LV SV MOD BP:      34.2 ml RIGHT VENTRICLE RV S prime:     10.10 cm/s TAPSE (M-mode): 1.7 cm LEFT ATRIUM             Index  RIGHT ATRIUM          Index LA diam:        3.60 cm 2.36 cm/m   RA Area:     9.29 cm LA Vol (A2C):   51.9 ml 33.96 ml/m  RA Volume:   21.10 ml 13.81 ml/m LA Vol (A4C):   48.1 ml 31.47 ml/m LA Biplane Vol: 53.0 ml 34.68 ml/m  AORTIC VALVE AV Area (Vmax):    1.80 cm AV Area (Vmean):   1.96 cm AV Area (VTI):     2.36 cm AV Vmax:           147.21 cm/s AV Vmean:          83.392 cm/s AV VTI:            0.206 m AV Peak Grad:      8.7 mmHg AV Mean Grad:      3.5 mmHg LVOT Vmax:         84.43 cm/s LVOT Vmean:        51.967 cm/s LVOT VTI:          0.155 m LVOT/AV VTI ratio: 0.75  AORTA Ao Root diam: 3.40 cm Ao Asc diam:  2.70 cm MITRAL VALVE                  TRICUSPID VALVE MV Area (PHT): 3.48 cm       TR Peak grad:   19.5 mmHg MV Decel Time: 218 msec       TR Vmax:        221.00 cm/s MR Peak grad:    113.6 mmHg MR Mean grad:    53.0 mmHg    SHUNTS MR Vmax:         533.00 cm/s  Systemic VTI:  0.16 m MR Vmean:        321.0 cm/s   Systemic Diam: 2.00 cm MR PISA:         0.57 cm MR PISA Eff ROA: 4 mm MR PISA Radius:  0.30 cm MV E velocity: 46.30 cm/s MV A velocity: 106.00 cm/s MV E/A ratio:  0.44 Carlyle Dolly MD Electronically signed by Carlyle Dolly MD Signature Date/Time: 04/24/2021/2:06:46 PM    Final     Subjective:   Patient was seen and examined 04/28/2021, 12:00  PM Patient stable today. No acute distress.  No issues overnight Stable for discharge.  Discharge Exam:    Vitals:   04/27/21 1217 04/27/21 2042 04/28/21 0506 04/28/21 0900  BP: (!) 145/58 (!) 145/46 (!) 145/51 (!) 170/48  Pulse: 75 75 77 77  Resp: 20 20 16    Temp: 98 F (36.7 C) 99 F (37.2 C) 98.2 F (36.8 C) 98.6 F (37 C)  TempSrc: Oral Oral Oral Axillary  SpO2: 95% 98% 96% 95%  Weight:   44.2 kg   Height:   5\' 3"  (1.6 m)     General: Pt lying comfortably in bed & appears in no obvious distress. Cardiovascular: S1 & S2 heard, RRR, S1/S2 +. No murmurs, rubs, gallops or clicks. No JVD or pedal edema. Respiratory: Clear to auscultation without wheezing, rhonchi or crackles. No increased work of breathing. Abdominal:  Non-distended, non-tender & soft. No organomegaly or masses appreciated. Normal bowel sounds heard. CNS: Alert and oriented. No focal deficits. Extremities: no edema, no cyanosis      The results of significant diagnostics from this hospitalization (including imaging, microbiology, ancillary and laboratory) are listed below for reference.  Microbiology:   Recent Results (from the past 240 hour(s))  Blood Culture (routine x 2)     Status: None (Preliminary result)   Collection Time: 04/24/21  6:12 AM   Specimen: BLOOD RIGHT ARM  Result Value Ref Range Status   Specimen Description   Final    BLOOD RIGHT ARM BOTTLES DRAWN AEROBIC AND ANAEROBIC   Special Requests Blood Culture adequate volume  Final   Culture   Final    NO GROWTH 4 DAYS Performed at [facility], [address], [place], [place] [address]    Report Status PENDING  Incomplete  Blood Culture (routine x 2)     Status: None (Preliminary result)   Collection Time: 04/24/21  6:15 AM   Specimen: BLOOD RIGHT ARM  Result Value Ref Range Status   Specimen Description   Final    BLOOD RIGHT ARM BOTTLES DRAWN AEROBIC AND ANAEROBIC   Special Requests   Final    Blood Culture results may not  be optimal due to an excessive volume of blood received in culture bottles   Culture   Final    NO GROWTH 4 DAYS Performed at [facility], [address] [place], [place] [address]    Report Status PENDING  Incomplete  Resp Panel by RT-PCR (Flu A&B, Covid) Nasopharyngeal Swab     Status: None   Collection Time: 04/24/21  7:20 AM   Specimen: Nasopharyngeal Swab; Nasopharyngeal(NP) swabs in vial transport medium  Result Value Ref Range Status   SARS Coronavirus 2 by RT PCR NEGATIVE NEGATIVE Final    Comment: (NOTE) SARS-CoV-2 target nucleic acids are NOT DETECTED.  The SARS-CoV-2 RNA is generally detectable in upper respiratory specimens during the acute phase of infection. The lowest concentration of SARS-CoV-2 viral copies this assay can detect is 138 copies/mL. A negative result does not preclude SARS-Cov-2 infection and should not be used as the sole basis for treatment or other patient management decisions. A negative result may occur with  improper specimen collection/handling, submission of specimen other than nasopharyngeal swab, presence of viral mutation(s) within the areas targeted by this assay, and inadequate number of viral copies(<138 copies/mL). A negative result must be combined with clinical observations, patient history, and epidemiological information. The expected result is Negative.  Fact Sheet for Patients:  EntrepreneurPulse.com.au  Fact Sheet for Healthcare Providers:  IncredibleEmployment.be  This test is no t yet approved or cleared by the [place] FDA and  has been authorized for detection and/or diagnosis of SARS-CoV-2 by FDA under an Emergency Use Authorization (EUA). This EUA will remain  in effect (meaning this test can be used) for the duration of the COVID-19 declaration under Section 564(b)(1) of the Act, 21 U.S.C.section 360bbb-3(b)(1), unless the authorization is terminated  or revoked sooner.        Influenza A by PCR NEGATIVE NEGATIVE Final   Influenza B by PCR NEGATIVE NEGATIVE Final    Comment: (NOTE) The Xpert Xpress SARS-CoV-2/FLU/RSV plus assay is intended as an aid in the diagnosis of influenza from Nasopharyngeal swab specimens and should not be used as a sole basis for treatment. Nasal washings and aspirates are unacceptable for Xpert Xpress SARS-CoV-2/FLU/RSV testing.  Fact Sheet for Patients: EntrepreneurPulse.com.au  Fact Sheet for Healthcare Providers: IncredibleEmployment.be  This test is not yet approved or cleared by the [place] FDA and has been authorized for detection and/or diagnosis of SARS-CoV-2 by FDA under an Emergency Use Authorization (EUA). This EUA will remain in effect (meaning this  test can be used) for the duration of the COVID-19 declaration under Section 564(b)(1) of the Act, 21 U.S.C. section 360bbb-3(b)(1), unless the authorization is terminated or revoked.  Performed at [facility], [address], [place], [place] [address]   Urine Culture     Status: Abnormal   Collection Time: 04/24/21  8:58 AM   Specimen: Urine, Catheterized  Result Value Ref Range Status   Specimen Description   Final    URINE, CATHETERIZED Performed at [facility], [address] [place], [place] [address]    Special Requests   Final    NONE Performed at [facility], [address] [place], [place] [address]    Culture >=100,000 COLONIES/mL ESCHERICHIA COLI (A)  Final   Report Status 04/26/2021 FINAL  Final   Organism ID, Bacteria ESCHERICHIA COLI (A)  Final      Susceptibility   Escherichia coli - MIC*    AMPICILLIN >=32 RESISTANT Resistant     CEFAZOLIN <=4 SENSITIVE Sensitive     CEFEPIME <=0.12 SENSITIVE Sensitive     CEFTRIAXONE <=0.25 SENSITIVE Sensitive     CIPROFLOXACIN <=0.25 SENSITIVE Sensitive     GENTAMICIN <=1 SENSITIVE Sensitive     IMIPENEM <=0.25 SENSITIVE Sensitive     NITROFURANTOIN <=16  SENSITIVE Sensitive     TRIMETH/SULFA <=20 SENSITIVE Sensitive     AMPICILLIN/SULBACTAM >=32 RESISTANT Resistant     PIP/TAZO <=4 SENSITIVE Sensitive     * >=100,000 COLONIES/mL ESCHERICHIA COLI  MRSA Next Gen by PCR, Nasal     Status: None   Collection Time: 04/24/21  3:08 PM   Specimen: Nasal Mucosa; Nasal Swab  Result Value Ref Range Status   MRSA by PCR Next Gen NOT DETECTED NOT DETECTED Final    Comment: (NOTE) The GeneXpert MRSA Assay (FDA approved for NASAL specimens only), is one component of a comprehensive MRSA colonization surveillance program. It is not intended to diagnose MRSA infection nor to guide or monitor treatment for MRSA infections. Test performance is not FDA approved in patients less than 77 years old. Performed at [facility], [address] [place], [place] [address]      Labs:   CBC: Recent Labs  Lab 04/24/21 [phone] 04/25/21 0508 04/27/21 0523 04/28/21 0520  WBC 18.4* 15.7* 17.0* 19.1*  NEUTROABS 14.2*  --   --   --   HGB 13.0 10.4* 10.9* 12.1  HCT 42.3 32.4* 34.6* 38.9  MCV 97.0 94.5 98.6 96.0  PLT 190 168 173 924   Basic Metabolic Panel: Recent Labs  Lab 04/24/21 0611 04/25/21 0508 04/26/21 0259 04/27/21 0523 04/28/21 0520  NA 137 136 135 137 134*  K 4.8 3.7 3.6 3.7 3.1*  CL 103 97* 97* 98 90*  CO2 27 31 32 32 35*  GLUCOSE 158* 121* 104* 103* 112*  BUN 32* 37* 37* 36* 39*  CREATININE 1.12* 1.15* 1.22* 1.12* 1.20*  CALCIUM 9.3 8.6* 8.1* 8.8* 9.1  PHOS  --   --  3.0  --  2.9   Liver Function Tests: Recent Labs  Lab 04/24/21 0611 04/26/21 0259 04/28/21 0520  AST 108*  --   --   ALT 160*  --   --   ALKPHOS 143*  --   --   BILITOT 0.9  --   --   PROT 6.8  --   --   ALBUMIN 3.6 2.6* 2.8*   BNP (last 3 results) Recent Labs    04/24/21 0612  BNP 2,877.0*   Cardiac  Enzymes: No results for input(s): CKTOTAL, CKMB, CKMBINDEX, TROPONINI in the last 168 hours. CBG: Recent Labs  Lab 04/26/21 1136 04/27/21 1110 04/27/21 1819  04/28/21 0725 04/28/21 1118  GLUCAP 129* 144* 200* 105* 115*   Hgb A1c No results for input(s): HGBA1C in the last 72 hours. Lipid Profile No results for input(s): CHOL, HDL, LDLCALC, TRIG, CHOLHDL, LDLDIRECT in the last 72 hours. Thyroid function studies No results for input(s): TSH, T4TOTAL, T3FREE, THYROIDAB in the last 72 hours.  Invalid input(s): FREET3 Anemia work up No results for input(s): VITAMINB12, FOLATE, FERRITIN, TIBC, IRON, RETICCTPCT in the last 72 hours. Urinalysis    Component Value Date/Time   COLORURINE YELLOW 04/24/2021 0856   APPEARANCEUR CLEAR 04/24/2021 0856   LABSPEC 1.020 04/24/2021 0856   PHURINE 6.0 04/24/2021 0856   GLUCOSEU NEGATIVE 04/24/2021 0856   HGBUR NEGATIVE 04/24/2021 0856   BILIRUBINUR NEGATIVE 04/24/2021 0856   KETONESUR NEGATIVE 04/24/2021 0856   PROTEINUR 30 (A) 04/24/2021 0856   NITRITE POSITIVE (A) 04/24/2021 0856   LEUKOCYTESUR SMALL (A) 04/24/2021 0856         Time coordinating discharge: Over 45 minutes  SIGNED: Deatra James, MD, FACP, FHM. [facility],  Please use [email or web address] to Page If 7PM-7AM, please contact night-coverage [email or web address],  04/28/2021, 12:00 PM

## 2021-04-29 ENCOUNTER — Non-Acute Institutional Stay (SKILLED_NURSING_FACILITY): Payer: Medicare Other | Admitting: Adult Health

## 2021-04-29 ENCOUNTER — Encounter: Payer: Self-pay | Admitting: Adult Health

## 2021-04-29 DIAGNOSIS — J3089 Other allergic rhinitis: Secondary | ICD-10-CM

## 2021-04-29 DIAGNOSIS — I5043 Acute on chronic combined systolic (congestive) and diastolic (congestive) heart failure: Secondary | ICD-10-CM

## 2021-04-29 DIAGNOSIS — I700 Atherosclerosis of aorta: Secondary | ICD-10-CM

## 2021-04-29 DIAGNOSIS — R278 Other lack of coordination: Secondary | ICD-10-CM | POA: Diagnosis not present

## 2021-04-29 DIAGNOSIS — I480 Paroxysmal atrial fibrillation: Secondary | ICD-10-CM | POA: Diagnosis not present

## 2021-04-29 DIAGNOSIS — M6281 Muscle weakness (generalized): Secondary | ICD-10-CM | POA: Diagnosis not present

## 2021-04-29 DIAGNOSIS — N1832 Chronic kidney disease, stage 3b: Secondary | ICD-10-CM

## 2021-04-29 DIAGNOSIS — J9601 Acute respiratory failure with hypoxia: Secondary | ICD-10-CM | POA: Diagnosis not present

## 2021-04-29 DIAGNOSIS — Z950 Presence of cardiac pacemaker: Secondary | ICD-10-CM | POA: Diagnosis not present

## 2021-04-29 DIAGNOSIS — E559 Vitamin D deficiency, unspecified: Secondary | ICD-10-CM | POA: Diagnosis not present

## 2021-04-29 DIAGNOSIS — J9621 Acute and chronic respiratory failure with hypoxia: Secondary | ICD-10-CM | POA: Diagnosis not present

## 2021-04-29 DIAGNOSIS — B9620 Unspecified Escherichia coli [E. coli] as the cause of diseases classified elsewhere: Secondary | ICD-10-CM

## 2021-04-29 DIAGNOSIS — I10 Essential (primary) hypertension: Secondary | ICD-10-CM

## 2021-04-29 DIAGNOSIS — Z1159 Encounter for screening for other viral diseases: Secondary | ICD-10-CM | POA: Diagnosis not present

## 2021-04-29 DIAGNOSIS — F419 Anxiety disorder, unspecified: Secondary | ICD-10-CM

## 2021-04-29 DIAGNOSIS — I160 Hypertensive urgency: Secondary | ICD-10-CM

## 2021-04-29 DIAGNOSIS — I2510 Atherosclerotic heart disease of native coronary artery without angina pectoris: Secondary | ICD-10-CM

## 2021-04-29 DIAGNOSIS — D72829 Elevated white blood cell count, unspecified: Secondary | ICD-10-CM

## 2021-04-29 DIAGNOSIS — R262 Difficulty in walking, not elsewhere classified: Secondary | ICD-10-CM | POA: Diagnosis not present

## 2021-04-29 DIAGNOSIS — N390 Urinary tract infection, site not specified: Secondary | ICD-10-CM

## 2021-04-29 DIAGNOSIS — E039 Hypothyroidism, unspecified: Secondary | ICD-10-CM | POA: Diagnosis not present

## 2021-04-29 LAB — CULTURE, BLOOD (ROUTINE X 2)
Culture: NO GROWTH
Culture: NO GROWTH
Special Requests: ADEQUATE

## 2021-04-29 NOTE — Progress Notes (Addendum)
Location:  [facility] Room Number: 654-Y Place of Service:  SNF (31)   CODE STATUS: DNR  Allergies  Allergen Reactions   Macrodantin Shortness Of Breath   Sulfa Antibiotics Shortness Of Breath    Chief Complaint  Patient presents with   Hospitalization Follow-up    HPI:  She is 88 year old woman who has been hospitalized from 04-24-21 through 04-28-21. Her medical history including: hypertension; CAD; heart failure. She presented to the ED for acute on chronic heart failure and acute respiratory failure. Acute on chronic combined systolic and diastolic dysfunction resulting in hypoxic respiratory failure; her EF is 20-25% BNP 2877. She was started on IV lasix and has transitioned to po lasix. Her dyspnea and hypoxia has improved continues on 02.  Leukocytosis may be partly due to recent use of steroids. She was treated with rocephin for her e-coli. She is here for short term rehab with her goal to return back home. She does have a cough; is tired and fatigues easily. She will continue to be followed for her chronic illnesses including:  Acute on chronic combined systolic and diastolic congestive heart failure: Paroxsymal afib:  Acquired hypothyroidism: Marland Kitchen Vitamin D deficiency:  Past Medical History:  Diagnosis Date   Cardiomyopathy ([place])    CHF (congestive heart failure) ([place])    Coronary artery disease    GERD (gastroesophageal reflux disease)    History of sick sinus syndrome    Hypertension    Hypothyroidism    Paroxysmal atrial fibrillation ([facility])    Shortness of breath dyspnea    Thyroid disease     Past Surgical History:  Procedure Laterality Date   APPENDECTOMY     CARDIAC CATHETERIZATION  07/25/2005   CATARACT EXTRACTION W/PHACO Right 08/21/2014   Procedure: CATARACT EXTRACTION PHACO AND INTRAOCULAR LENS PLACEMENT ([place]);  Surgeon: Tonny Branch, MD;  Location: [facility];  Service: Ophthalmology;  Laterality: Right;  CDE 10.94   CATARACT EXTRACTION W/PHACO  Left 09/08/2014   Procedure: CATARACT EXTRACTION PHACO AND INTRAOCULAR LENS PLACEMENT ([facility]);  Surgeon: Tonny Branch, MD;  Location: [facility];  Service: Ophthalmology;  Laterality: Left;  CDE:7.79   CHOLECYSTECTOMY     KNEE ARTHROSCOPY Right    NM MYOVIEW LTD  11/06/2008   PACEMAKER PLACEMENT      Social History   Socioeconomic History   Marital status: Widowed    Spouse name: Not on file   Number of children: Not on file   Years of education: Not on file   Highest education level: Not on file  Occupational History   Not on file  Tobacco Use   Smoking status: Former    Packs/day: 0.25    Years: 15.00    Pack years: 3.75    Types: Cigarettes    Quit date: 08/13/1970    Years since quitting: 50.7   Smokeless tobacco: Never  Vaping Use   Vaping Use: Never used  Substance and Sexual Activity   Alcohol use: No    Alcohol/week: 0.0 standard drinks   Drug use: No   Sexual activity: Never    Birth control/protection: Post-menopausal  Other Topics Concern   Not on file  Social History Narrative   Not on file   Social Determinants of Health   Financial Resource Strain: Not on file  Food Insecurity: Not on file  Transportation Needs: Not on file  Physical Activity: Not on file  Stress: Not on file  Social Connections: Not on file  Intimate Partner  Violence: Not on file   Family History  Problem Relation Age of Onset   Heart disease Other    Cancer Other    Kidney disease Other       VITAL SIGNS BP (!) 108/58    Pulse 72    Temp (!) 97.5 F (36.4 C)    Resp 20    Ht '5\' 3"'  (1.6 m)    Wt 97 lb 12.8 oz (44.4 kg)    SpO2 96%    BMI 17.32 kg/m   Outpatient Encounter Medications as of 04/29/2021  Medication Sig   acetaminophen (TYLENOL) 325 MG tablet Take 2 tablets (650 mg total) by mouth every 6 (six) hours as needed for mild pain (or Fever >/= 101).   ALPRAZolam (XANAX) 0.5 MG tablet Take 1 tablet (0.5 mg total) by mouth 3 (three) times daily as needed.   apixaban (ELIQUIS)  2.5 MG TABS tablet Take 1 tablet (2.5 mg total) by mouth 2 (two) times daily.   carvedilol (COREG) 6.25 MG tablet Take 1 tablet (6.25 mg total) by mouth 2 (two) times daily with a meal.   ciprofloxacin (CIPRO) 500 MG tablet Take 1 tablet (500 mg total) by mouth 2 (two) times daily for 3 days.   diclofenac Sodium (VOLTAREN) 1 % GEL Apply 2 g topically 4 (four) times daily.   digoxin (LANOXIN) 0.125 MG tablet Take one tablet on Monday - Friday and None on Saturday or Sunday   ergocalciferol (VITAMIN D2) 1.25 MG (50000 UT) capsule Take 50,000 Units by mouth once a week.   furosemide (LASIX) 20 MG tablet Take 20 mg by mouth 2 (two) times a week.    hydrALAZINE (APRESOLINE) 100 MG tablet Take 1 tablet (100 mg total) by mouth 2 (two) times daily.   ipratropium (ATROVENT) 0.03 % nasal spray ipratropium bromide 21 mcg (0.03 %) nasal spray   isosorbide mononitrate (IMDUR) 30 MG 24 hr tablet Take 1 tablet (30 mg total) by mouth daily.   levocetirizine (XYZAL) 5 MG tablet Take 5 mg by mouth every morning.    levothyroxine (SYNTHROID, LEVOTHROID) 88 MCG tablet Take 88 mcg by mouth daily before breakfast.    lisinopril (ZESTRIL) 20 MG tablet Take 1 tablet (20 mg total) by mouth daily.   Multiple Vitamins-Minerals (PRESERVISION AREDS PO) Take 1 tablet by mouth 2 (two) times daily.   Omega-3 Fatty Acids (FISH OIL) 1000 MG CAPS Take 300-1,000 mg by mouth daily.   Polyethyl Glycol-Propyl Glycol (SYSTANE OP) Place 1 drop into both eyes 3 (three) times daily.    Potassium 99 MG TABS Take 1 tablet by mouth 2 (two) times daily.    saccharomyces boulardii (DAILY PROBIOTIC SUPPLEMENT) 250 MG capsule Take 250 mg by mouth daily. Tid with food/meals x 5 more days   VENTOLIN HFA 108 (90 BASE) MCG/ACT inhaler Inhale 2 puffs into the lungs every 4 (four) hours as needed for wheezing or shortness of breath.    vitamin C (ASCORBIC ACID) 500 MG tablet Take 500 mg by mouth every morning.    [DISCONTINUED] lactobacillus  acidophilus & bulgar (LACTINEX) chewable tablet Chew 1 tablet by mouth 3 (three) times daily with meals for 5 days.   No facility-administered encounter medications on file as of 04/29/2021.     SIGNIFICANT DIAGNOSTIC EXAMS  TODAY  04-24-21: chest x-ray:  1. The appearance the chest suggests mild congestive heart failure, as above. 2. Aortic atherosclerosis.  04-24-21: 2-d echo 1. Left ventricular ejection fraction, by estimation, is 20  to 25%. The  left ventricle has severely decreased function. The left ventricle  demonstrates global hypokinesis. Left ventricular diastolic parameters are  consistent with Grade I diastolic  dysfunction (impaired relaxation).   04-26-21: chest x-ray:  Cardiomegaly. COPD. There is decrease in pulmonary vascular congestion and decrease in interstitial pulmonary edema. Small bilateral pleural effusions.  LABS REVIEWED:   04-24-21: wbc 18.4; hgb 13.0; hct 42.3; mcv 97.0 plt 190; glucose 158; bun 32; creat 1.12; k+ 4.8; na++ 137; ca 9.3 GFR 46; ast 108 alt 160 alk phos 143; BNP 2877.0 dig 0.9 blood culture: no growth  urine culture: e-coli  rocephin/cipro  04-27-21: wbc 17.0 hgb 10.9; hct 34.6; mcv 98.6 plt 173; glucose 103; bun 36; creat 1.12; k+ 3.7; na++ 137; ca 8.8; GFR 46.  04-28-21: wbc 19.1; hgb 12.1; hct 38.9; mcv 96; plt 197; glucose 112; bun 39; creat 1.20 ;k+ 3.1; n++ 134; ca 9.1; GFR 42 phos 2.9; albumin 2.8   Review of Systems  Constitutional:  Positive for malaise/fatigue.  Respiratory:  Positive for cough and shortness of breath.   Cardiovascular:  Negative for chest pain, palpitations and leg swelling.  Gastrointestinal:  Negative for abdominal pain, constipation and heartburn.  Musculoskeletal:  Negative for back pain, joint pain and myalgias.  Skin: Negative.   Neurological:  Negative for dizziness.  Psychiatric/Behavioral:  The patient is not nervous/anxious.    Physical Exam Constitutional:      General: She is not in acute distress.     Appearance: She is underweight. She is not diaphoretic.  Neck:     Thyroid: No thyromegaly.  Cardiovascular:     Rate and Rhythm: Normal rate and regular rhythm.     Pulses: Normal pulses.     Heart sounds: Normal heart sounds.     Comments: Pace maker  Pulmonary:     Effort: Pulmonary effort is normal. No respiratory distress.     Breath sounds: Normal breath sounds.     Comments: 02 Abdominal:     General: Bowel sounds are normal. There is no distension.     Palpations: Abdomen is soft.     Tenderness: There is no abdominal tenderness.  Musculoskeletal:        General: Normal range of motion.     Cervical back: Neck supple.     Right lower leg: No edema.     Left lower leg: No edema.     Comments: Kyphosis   Lymphadenopathy:     Cervical: No cervical adenopathy.  Skin:    General: Skin is warm and dry.  Neurological:     Mental Status: She is alert and oriented to person, place, and time.  Psychiatric:        Mood and Affect: Mood normal.     ASSESSMENT/ PLAN:  TODAY  Acute on chronic combined systolic and diastolic congestive heart failure: EF 20-25% (04-24-21) will continue lasix 20 mg twice weekly takes  99 mg twice daily coreg 6.25 mg twice daily   2. Paroxsymal afib: heart rate is stable is status post pacemaker; will continue eliquis 2.5 mg twice daily; take coreg 6.25 mg twice daily for rate control digoxin 0.125 mg 5 days per week  3. Acquired hypothyroidism: is stable synthroid 88 mcg daily   4. Vitamin D deficiency: will continue vitamin D 50,000 units weekly   5. Chronic non seasonal allergic rhintis: is stable will continue xyzal 5 mg daily   6. Coronary artery disease involving native arteries native heart without  angina: is stable will continue imdur 30 mg daily coreg 6.25 mg twice daily will monitor   7. Benign essential hypertension with hypertensive urgency: is stable 108/58 will continue coreg 6.25 mg twice daily; apresoline 100 mg twice daily  lisinopril 20 mg daily   8.  Aortic atherosclerosis (cxr 04-24-21)   9. Anxiety: will continue xanax 0.5 mg three times daily as needed   10. E-coli UTI will complete cipro and will monitor her status.   11. Leukocytosis: 19.1 felt to be related to steroid use  12. CKD stage 3b:  bun 39; creat 1.20 GFR 42 will monitor    Ok Edwards NP [facility]   call [phone]

## 2021-04-30 ENCOUNTER — Non-Acute Institutional Stay (SKILLED_NURSING_FACILITY): Payer: Medicare Other | Admitting: Internal Medicine

## 2021-04-30 DIAGNOSIS — B9620 Unspecified Escherichia coli [E. coli] as the cause of diseases classified elsewhere: Secondary | ICD-10-CM

## 2021-04-30 DIAGNOSIS — I160 Hypertensive urgency: Secondary | ICD-10-CM

## 2021-04-30 DIAGNOSIS — J9601 Acute respiratory failure with hypoxia: Secondary | ICD-10-CM | POA: Diagnosis not present

## 2021-04-30 DIAGNOSIS — M6281 Muscle weakness (generalized): Secondary | ICD-10-CM | POA: Diagnosis not present

## 2021-04-30 DIAGNOSIS — I5043 Acute on chronic combined systolic (congestive) and diastolic (congestive) heart failure: Secondary | ICD-10-CM

## 2021-04-30 DIAGNOSIS — N390 Urinary tract infection, site not specified: Secondary | ICD-10-CM | POA: Diagnosis not present

## 2021-04-30 DIAGNOSIS — Z950 Presence of cardiac pacemaker: Secondary | ICD-10-CM | POA: Diagnosis not present

## 2021-04-30 DIAGNOSIS — R262 Difficulty in walking, not elsewhere classified: Secondary | ICD-10-CM | POA: Diagnosis not present

## 2021-04-30 DIAGNOSIS — R278 Other lack of coordination: Secondary | ICD-10-CM | POA: Diagnosis not present

## 2021-04-30 NOTE — Progress Notes (Signed)
NURSING HOME LOCATION:  [facility] ROOM NUMBER:  [id number]  CODE STATUS:  DNR  PCP: Sharilyn Sites, MD  This is a comprehensive admission note to this SNFperformed on this date less than 30 days from date of admission. Included are preadmission medical/surgical history; reconciled medication list; family history; social history and comprehensive review of systems.  Corrections and additions to the records were documented. Comprehensive physical exam was also performed. Additionally a clinical summary was entered for each active diagnosis pertinent to this admission in the Problem List to enhance continuity of care.  HPI: She was hospitalized 1/28 - 04/28/2021 with acute on chronic combined systolic /diastolic dysfunction CHF exacerbation associated with hypoxic respiratory failure.  Initial troponin 33 with a peak of 39.  BNP was 2877.  Lactic acidosis is present with a lactic acid level 2.2 thought to be secondary to hypoxia rather than frank sepsis.  Repeat lactic acid level was 1.7.  Leukocytosis was documented; this was felt to be related to recent repeated burst of steroids.  Chest x-ray revealed no evidence of community-acquired pneumonia. Echo revealed EF of 20-25% with global hypokinesia and grade 1 diastolic dysfunction.  Aggressive diuresis intravenously resulted in clinical improvement in her dyspnea and hypoxia.  Serial chest x-rays revealed improved congestive heart failure.  Digitalis level was low therapeutic at 0.9.  Eliquis was continued for stroke prophylaxis..Admission weight was 111; at discharge weight was 99 pounds.  Baseline is felt to be approximately 106.  Hypertensive urgency was treated with IV nitro drip which was subsequently weaned.  Carvedilol, lisinopril, hydralazine and isosorbide were continued. E. coli UTI was documented for which she received Rocephin with transition to oral Cipro at discharge. Patient initiated CODE STATUS discussions and requested  DNR/DNI status. Supplemental oxygen was continued at discharge. PT evaluated the patient and recommended SNF placement for rehab because of generalized weakness and deconditioning.  Past medical and surgical history: Includes history of congestive heart failure, CAD, GERD, essential hypertension, hypothyroidism, PAF, and cardiomyopathy. Surgeries and procedures include cardiac catheterization, TKA, cholecystectomy, and pacemaker placement.  Social history: Nondrinker; past medical history of minimal smoking.  Family history: Noncontributory due to advanced age.   Review of systems: She states that her breathing is "not real good".  She has an intermittent cough productive of white sputum.  She describes dysphagia both with food intake as well as pills.  She also has intermittent heartburn.  Despite her age she describes intermittent hot flashes.  She has occasional numbness in the left hand.  Insomnia is a chronic problem for which she takes Xanax at home.  She sleeps with the head of her bed elevated; but she denies paroxysmal nocturnal dyspnea.  Constitutional: No fever Eyes: No redness, discharge, pain, vision change ENT/mouth: No nasal congestion, purulent discharge, earache, change in hearing, sore throat  Respiratory: No hemoptysis, significant snoring, apnea  Gastrointestinal: No abdominal pain, nausea /vomiting, rectal bleeding, melena, change in bowels Genitourinary: No dysuria, hematuria, pyuria, incontinence, nocturia Musculoskeletal: No joint stiffness, joint swelling, pain Dermatologic: No rash, pruritus, change in appearance of skin Neurologic: No dizziness, headache, syncope, seizures Endocrine: No change in hair/skin/nails, excessive thirst, excessive hunger, excessive urination  Hematologic/lymphatic: No significant bruising, lymphadenopathy, abnormal bleeding Allergy/immunology: No itchy/watery eyes, significant sneezing, urticaria, angioedema  Physical exam:  Pertinent  or positive findings: She appears her age; clinically she appears very frail.  She is hard of hearing.  Eyebrows are decreased laterally.  Dental hygiene is immaculate.  She exhibits an  intermittent weak, rattley, non productive  cough.  She has scattered low-grade rales in the left posterior thorax.  Heart sounds are distant and almost indiscernible.  Pacemaker is present on the left.  Pedal pulses are decreased.  There is minor nonpitting ankle edema.  Limb atrophy is present.  There is bruising at the right wrist.  General appearance: no acute distress, increased work of breathing is present.   Lymphatic: No lymphadenopathy about the head, neck, axilla. Eyes: No conjunctival inflammation or lid edema is present. There is no scleral icterus. Ears:  External ear exam shows no significant lesions or deformities.   Nose:  External nasal examination shows no deformity or inflammation. Nasal mucosa are pink and moist without lesions, exudates Oral exam: Lips and gums are healthy appearing.There is no oropharyngeal erythema or exudate. Neck:  No thyromegaly, masses, tenderness noted.    Heart:  Nor definite gallop, murmur, click, rub.  Lungs: without wheezes, rhonchi,rubs. Abdomen: Bowel sounds are normal.  Abdomen is soft and nontender with no organomegaly, hernias, masses. GU: Deferred  Extremities:  No cyanosis, clubbing Neurologic exam:  Balance, Rhomberg, finger to nose testing could not be completed due to clinical state Skin: Warm & dry w/o tenting. No significant rash.  See clinical summary under each active problem in the Problem List with associated updated therapeutic plan

## 2021-05-01 ENCOUNTER — Encounter: Payer: Self-pay | Admitting: Internal Medicine

## 2021-05-01 DIAGNOSIS — N390 Urinary tract infection, site not specified: Secondary | ICD-10-CM | POA: Insufficient documentation

## 2021-05-01 DIAGNOSIS — B9620 Unspecified Escherichia coli [E. coli] as the cause of diseases classified elsewhere: Secondary | ICD-10-CM | POA: Insufficient documentation

## 2021-05-01 DIAGNOSIS — I160 Hypertensive urgency: Secondary | ICD-10-CM | POA: Insufficient documentation

## 2021-05-01 NOTE — Assessment & Plan Note (Signed)
Present blood pressure well controlled.

## 2021-05-01 NOTE — Assessment & Plan Note (Addendum)
Monitor daily weights.  Baseline felt to be 106 #

## 2021-05-01 NOTE — Assessment & Plan Note (Signed)
Patient denies active GU symptoms.

## 2021-05-01 NOTE — Patient Instructions (Signed)
See assessment and plan under each diagnosis in the problem list and acutely for this visit 

## 2021-05-03 DIAGNOSIS — Z950 Presence of cardiac pacemaker: Secondary | ICD-10-CM | POA: Diagnosis not present

## 2021-05-03 DIAGNOSIS — M6281 Muscle weakness (generalized): Secondary | ICD-10-CM | POA: Diagnosis not present

## 2021-05-03 DIAGNOSIS — R262 Difficulty in walking, not elsewhere classified: Secondary | ICD-10-CM | POA: Diagnosis not present

## 2021-05-03 DIAGNOSIS — R278 Other lack of coordination: Secondary | ICD-10-CM | POA: Diagnosis not present

## 2021-05-03 DIAGNOSIS — I5043 Acute on chronic combined systolic (congestive) and diastolic (congestive) heart failure: Secondary | ICD-10-CM | POA: Diagnosis not present

## 2021-05-03 DIAGNOSIS — J9601 Acute respiratory failure with hypoxia: Secondary | ICD-10-CM | POA: Diagnosis not present

## 2021-05-04 DIAGNOSIS — Z950 Presence of cardiac pacemaker: Secondary | ICD-10-CM | POA: Diagnosis not present

## 2021-05-04 DIAGNOSIS — I480 Paroxysmal atrial fibrillation: Secondary | ICD-10-CM | POA: Insufficient documentation

## 2021-05-04 DIAGNOSIS — E559 Vitamin D deficiency, unspecified: Secondary | ICD-10-CM | POA: Insufficient documentation

## 2021-05-04 DIAGNOSIS — E039 Hypothyroidism, unspecified: Secondary | ICD-10-CM | POA: Insufficient documentation

## 2021-05-04 DIAGNOSIS — J9691 Respiratory failure, unspecified with hypoxia: Secondary | ICD-10-CM | POA: Insufficient documentation

## 2021-05-04 DIAGNOSIS — I5043 Acute on chronic combined systolic (congestive) and diastolic (congestive) heart failure: Secondary | ICD-10-CM | POA: Diagnosis not present

## 2021-05-04 DIAGNOSIS — I2510 Atherosclerotic heart disease of native coronary artery without angina pectoris: Secondary | ICD-10-CM | POA: Insufficient documentation

## 2021-05-04 DIAGNOSIS — R262 Difficulty in walking, not elsewhere classified: Secondary | ICD-10-CM | POA: Diagnosis not present

## 2021-05-04 DIAGNOSIS — J3089 Other allergic rhinitis: Secondary | ICD-10-CM | POA: Insufficient documentation

## 2021-05-04 DIAGNOSIS — J9601 Acute respiratory failure with hypoxia: Secondary | ICD-10-CM | POA: Diagnosis not present

## 2021-05-04 DIAGNOSIS — I700 Atherosclerosis of aorta: Secondary | ICD-10-CM | POA: Insufficient documentation

## 2021-05-04 DIAGNOSIS — D72829 Elevated white blood cell count, unspecified: Secondary | ICD-10-CM | POA: Insufficient documentation

## 2021-05-04 DIAGNOSIS — F419 Anxiety disorder, unspecified: Secondary | ICD-10-CM | POA: Insufficient documentation

## 2021-05-04 DIAGNOSIS — N183 Chronic kidney disease, stage 3 unspecified: Secondary | ICD-10-CM | POA: Insufficient documentation

## 2021-05-04 DIAGNOSIS — M6281 Muscle weakness (generalized): Secondary | ICD-10-CM | POA: Diagnosis not present

## 2021-05-04 DIAGNOSIS — R278 Other lack of coordination: Secondary | ICD-10-CM | POA: Diagnosis not present

## 2021-05-05 DIAGNOSIS — R278 Other lack of coordination: Secondary | ICD-10-CM | POA: Diagnosis not present

## 2021-05-05 DIAGNOSIS — I5043 Acute on chronic combined systolic (congestive) and diastolic (congestive) heart failure: Secondary | ICD-10-CM | POA: Diagnosis not present

## 2021-05-05 DIAGNOSIS — J9601 Acute respiratory failure with hypoxia: Secondary | ICD-10-CM | POA: Diagnosis not present

## 2021-05-05 DIAGNOSIS — R262 Difficulty in walking, not elsewhere classified: Secondary | ICD-10-CM | POA: Diagnosis not present

## 2021-05-05 DIAGNOSIS — Z950 Presence of cardiac pacemaker: Secondary | ICD-10-CM | POA: Diagnosis not present

## 2021-05-05 DIAGNOSIS — M6281 Muscle weakness (generalized): Secondary | ICD-10-CM | POA: Diagnosis not present

## 2021-05-06 ENCOUNTER — Non-Acute Institutional Stay (SKILLED_NURSING_FACILITY): Payer: Medicare Other | Admitting: Adult Health

## 2021-05-06 ENCOUNTER — Other Ambulatory Visit (HOSPITAL_COMMUNITY)
Admission: RE | Admit: 2021-05-06 | Discharge: 2021-05-06 | Disposition: A | Discharge status: Home / Self Care | Payer: Medicare Other | Source: Skilled Nursing Facility | Attending: Adult Health | Admitting: Adult Health

## 2021-05-06 ENCOUNTER — Encounter: Payer: Self-pay | Admitting: Adult Health

## 2021-05-06 DIAGNOSIS — Z1159 Encounter for screening for other viral diseases: Secondary | ICD-10-CM | POA: Diagnosis not present

## 2021-05-06 DIAGNOSIS — R262 Difficulty in walking, not elsewhere classified: Secondary | ICD-10-CM | POA: Diagnosis not present

## 2021-05-06 DIAGNOSIS — N1832 Chronic kidney disease, stage 3b: Secondary | ICD-10-CM

## 2021-05-06 DIAGNOSIS — I5043 Acute on chronic combined systolic (congestive) and diastolic (congestive) heart failure: Secondary | ICD-10-CM | POA: Insufficient documentation

## 2021-05-06 DIAGNOSIS — Z950 Presence of cardiac pacemaker: Secondary | ICD-10-CM | POA: Diagnosis not present

## 2021-05-06 DIAGNOSIS — J9621 Acute and chronic respiratory failure with hypoxia: Secondary | ICD-10-CM | POA: Diagnosis not present

## 2021-05-06 DIAGNOSIS — J9601 Acute respiratory failure with hypoxia: Secondary | ICD-10-CM | POA: Diagnosis not present

## 2021-05-06 DIAGNOSIS — R278 Other lack of coordination: Secondary | ICD-10-CM | POA: Diagnosis not present

## 2021-05-06 DIAGNOSIS — E43 Unspecified severe protein-calorie malnutrition: Secondary | ICD-10-CM

## 2021-05-06 DIAGNOSIS — N179 Acute kidney failure, unspecified: Secondary | ICD-10-CM

## 2021-05-06 DIAGNOSIS — M6281 Muscle weakness (generalized): Secondary | ICD-10-CM | POA: Diagnosis not present

## 2021-05-06 LAB — CBC
HCT: 34.8 % — ABNORMAL LOW (ref 36.0–46.0)
Hemoglobin: 11.2 g/dL — ABNORMAL LOW (ref 12.0–15.0)
MCH: 31 pg (ref 26.0–34.0)
MCHC: 32.2 g/dL (ref 30.0–36.0)
MCV: 96.4 fL (ref 80.0–100.0)
Platelets: 241 10*3/uL (ref 150–400)
RBC: 3.61 MIL/uL — ABNORMAL LOW (ref 3.87–5.11)
RDW: 13.9 % (ref 11.5–15.5)
WBC: 7.7 10*3/uL (ref 4.0–10.5)
nRBC: 0 % (ref 0.0–0.2)

## 2021-05-06 LAB — COMPREHENSIVE METABOLIC PANEL
ALT: 31 U/L (ref 0–44)
AST: 23 U/L (ref 15–41)
Albumin: 2.8 g/dL — ABNORMAL LOW (ref 3.5–5.0)
Alkaline Phosphatase: 70 U/L (ref 38–126)
Anion gap: 6 (ref 5–15)
BUN: 59 mg/dL — ABNORMAL HIGH (ref 8–23)
CO2: 38 mmol/L — ABNORMAL HIGH (ref 22–32)
Calcium: 8.8 mg/dL — ABNORMAL LOW (ref 8.9–10.3)
Chloride: 91 mmol/L — ABNORMAL LOW (ref 98–111)
Creatinine, Ser: 1.63 mg/dL — ABNORMAL HIGH (ref 0.44–1.00)
GFR, Estimated: 29 mL/min — ABNORMAL LOW (ref >60)
Glucose, Bld: 98 mg/dL (ref 70–99)
Potassium: 4.2 mmol/L (ref 3.5–5.1)
Sodium: 135 mmol/L (ref 135–145)
Total Bilirubin: 0.6 mg/dL (ref 0.3–1.2)
Total Protein: 6 g/dL — ABNORMAL LOW (ref 6.5–8.1)

## 2021-05-06 NOTE — Progress Notes (Signed)
Location:  [facility] Room Number: 976-B Place of Service:  SNF (31) Provider: Ok Edwards, NP  CODE STATUS: DNR  Allergies  Allergen Reactions   Macrodantin Shortness Of Breath   Sulfa Antibiotics Shortness Of Breath    Chief Complaint  Patient presents with   Acute Visit    Follow up labs.    HPI:  [name] has stage CKD. [name] is on lisinopril daily. Her renal is worsening. [name] denies any changes in her appetite or fluid intake. We have discussed the results of her lab work and the need to stop lisinopril and to begin prostat.   Past Medical History:  Diagnosis Date   Cardiomyopathy [facility])    CHF (congestive heart failure) (HCC)    Coronary artery disease    GERD (gastroesophageal reflux disease)    History of sick sinus syndrome    Hypertension    Hypothyroidism    Paroxysmal atrial fibrillation (HCC)    Shortness of breath dyspnea    Thyroid disease     Past Surgical History:  Procedure Laterality Date   APPENDECTOMY     CARDIAC CATHETERIZATION  07/25/2005   CATARACT EXTRACTION W/PHACO Right 08/21/2014   Procedure: CATARACT EXTRACTION PHACO AND INTRAOCULAR LENS PLACEMENT ([place]);  Surgeon: Tonny Branch, MD;  Location: [facility];  Service: Ophthalmology;  Laterality: Right;  CDE 10.94   CATARACT EXTRACTION W/PHACO Left 09/08/2014   Procedure: CATARACT EXTRACTION PHACO AND INTRAOCULAR LENS PLACEMENT ([facility]);  Surgeon: Tonny Branch, MD;  Location: [facility];  Service: Ophthalmology;  Laterality: Left;  CDE:7.79   CHOLECYSTECTOMY     KNEE ARTHROSCOPY Right    NM MYOVIEW LTD  11/06/2008   PACEMAKER PLACEMENT      Social History   Socioeconomic History   Marital status: Widowed    Spouse name: Not on file   Number of children: Not on file   Years of education: Not on file   Highest education level: Not on file  Occupational History   Not on file  Tobacco Use   Smoking status: Former    Packs/day: 0.25    Years: 15.00    Pack years: 3.75    Types:  Cigarettes    Quit date: 08/13/1970    Years since quitting: 50.7   Smokeless tobacco: Never  Vaping Use   Vaping Use: Never used  Substance and Sexual Activity   Alcohol use: No    Alcohol/week: 0.0 standard drinks   Drug use: No   Sexual activity: Never    Birth control/protection: Post-menopausal  Other Topics Concern   Not on file  Social History Narrative   Not on file   Social Determinants of Health   Financial Resource Strain: Not on file  Food Insecurity: Not on file  Transportation Needs: Not on file  Physical Activity: Not on file  Stress: Not on file  Social Connections: Not on file  Intimate Partner Violence: Not on file   Family History  Problem Relation Age of Onset   Heart disease Other    Cancer Other    Kidney disease Other       VITAL SIGNS BP (!) 119/53    Pulse 81    Temp 98.1 F (36.7 C)    Resp 20    Ht '5\' 3"'  (1.6 m)    Wt 97 lb 12.8 oz (44.4 kg)    SpO2 96%    BMI 17.32 kg/m   Outpatient Encounter Medications as of 05/06/2021  Medication Sig  acetaminophen (TYLENOL) 325 MG tablet Take 2 tablets (650 mg total) by mouth every 6 (six) hours as needed for mild pain (or Fever >/= 101).   ALPRAZolam (XANAX) 0.5 MG tablet Take 1 tablet (0.5 mg total) by mouth 3 (three) times daily as needed.   Amino Acids-Protein Hydrolys (FEEDING SUPPLEMENT, PRO-STAT SUGAR FREE 64,) LIQD Take 30 mLs by mouth 2 (two) times daily.   apixaban (ELIQUIS) 2.5 MG TABS tablet Take 1 tablet (2.5 mg total) by mouth 2 (two) times daily.   carvedilol (COREG) 6.25 MG tablet Take 1 tablet (6.25 mg total) by mouth 2 (two) times daily with a meal.   digoxin (LANOXIN) 0.125 MG tablet Take one tablet on Monday - Friday and None on Saturday or Sunday   ergocalciferol (VITAMIN D2) 1.25 MG (50000 UT) capsule Take 50,000 Units by mouth once a week.   furosemide (LASIX) 20 MG tablet Take 20 mg by mouth 2 (two) times a week.    hydrALAZINE (APRESOLINE) 100 MG tablet Take 1 tablet (100 mg  total) by mouth 2 (two) times daily.   ipratropium (ATROVENT) 0.03 % nasal spray ipratropium bromide 21 mcg (0.03 %) nasal spray   isosorbide mononitrate (IMDUR) 30 MG 24 hr tablet Take 1 tablet (30 mg total) by mouth daily.   levocetirizine (XYZAL) 5 MG tablet Take 5 mg by mouth every morning.    levothyroxine (SYNTHROID, LEVOTHROID) 88 MCG tablet Take 88 mcg by mouth daily before breakfast.    melatonin 1 MG TABS tablet Take 1 mg by mouth at bedtime.   Multiple Vitamins-Minerals (PRESERVISION AREDS PO) Take 1 tablet by mouth 2 (two) times daily.   Omega-3 Fatty Acids (FISH OIL) 1000 MG CAPS Take 300-1,000 mg by mouth daily.   omeprazole (PRILOSEC) 20 MG capsule Take 20 mg by mouth daily.   Polyethyl Glycol-Propyl Glycol (SYSTANE OP) Place 1 drop into both eyes 3 (three) times daily.    Potassium 99 MG TABS Take 1 tablet by mouth 2 (two) times daily.    VENTOLIN HFA 108 (90 BASE) MCG/ACT inhaler Inhale 2 puffs into the lungs every 4 (four) hours as needed for wheezing or shortness of breath.    vitamin C (ASCORBIC ACID) 500 MG tablet Take 500 mg by mouth every morning.    [DISCONTINUED] diclofenac Sodium (VOLTAREN) 1 % GEL Apply 2 g topically 4 (four) times daily.   [DISCONTINUED] lisinopril (ZESTRIL) 20 MG tablet Take 1 tablet (20 mg total) by mouth daily.   [DISCONTINUED] saccharomyces boulardii (DAILY PROBIOTIC SUPPLEMENT) 250 MG capsule Take 250 mg by mouth daily. Tid with food/meals x 5 more days   No facility-administered encounter medications on file as of 05/06/2021.     SIGNIFICANT DIAGNOSTIC EXAMS  PREVIOUS   04-24-21: chest x-ray:  1. The appearance the chest suggests mild congestive heart failure, as above. 2. Aortic atherosclerosis.  04-24-21: 2-d echo 1. Left ventricular ejection fraction, by estimation, is 20 to 25%. The  left ventricle has severely decreased function. The left ventricle  demonstrates global hypokinesis. Left ventricular diastolic parameters are  consistent  with Grade I diastolic  dysfunction (impaired relaxation).   04-26-21: chest x-ray:  Cardiomegaly. COPD. There is decrease in pulmonary vascular congestion and decrease in interstitial pulmonary edema. Small bilateral pleural effusions.  NO NEW EXAMS  LABS REVIEWED:   04-24-21: wbc 18.4; hgb 13.0; hct 42.3; mcv 97.0 plt 190; glucose 158; bun 32; creat 1.12; k+ 4.8; na++ 137; ca 9.3 GFR 46; ast 108 alt 160 alk  phos 143; BNP 2877.0 dig 0.9 blood culture: no growth  urine culture: e-coli  rocephin/cipro  04-27-21: wbc 17.0 hgb 10.9; hct 34.6; mcv 98.6 plt 173; glucose 103; bun 36; creat 1.12; k+ 3.7; na++ 137; ca 8.8; GFR 46.  04-28-21: wbc 19.1; hgb 12.1; hct 38.9; mcv 96; plt 197; glucose 112; bun 39; creat 1.20 ;k+ 3.1; n++ 134; ca 9.1; GFR 42 phos 2.9; albumin 2.8   TODAY  05-06-21: wbc 7.7; hgb 11.2; hct 34.8; mcv 996.4 plt 241; glucose 98; bun 59; creat 1.63; k+ 4.2 na++ 135; ca 8.8; GFR 29 ;liver normal albumin 2.8   Review of Systems  Constitutional:  Negative for malaise/fatigue.  Respiratory:  Negative for cough and shortness of breath.   Cardiovascular:  Negative for chest pain, palpitations and leg swelling.  Gastrointestinal:  Negative for abdominal pain, constipation and heartburn.  Musculoskeletal:  Negative for back pain, joint pain and myalgias.  Skin: Negative.   Neurological:  Negative for dizziness.  Psychiatric/Behavioral:  The patient is not nervous/anxious.     Physical Exam Constitutional:      General: [name] is not in acute distress.    Appearance: [name] is well-developed. [name] is not diaphoretic.  Neck:     Thyroid: No thyromegaly.  Cardiovascular:     Rate and Rhythm: Normal rate and regular rhythm.     Pulses: Normal pulses.     Heart sounds: Normal heart sounds.     Comments: Pace maker  Pulmonary:     Effort: Pulmonary effort is normal. No respiratory distress.     Breath sounds: Normal breath sounds.     Comments: 02  Abdominal:     General: Bowel sounds  are normal. There is no distension.     Palpations: Abdomen is soft.     Tenderness: There is no abdominal tenderness.  Musculoskeletal:        General: Normal range of motion.     Cervical back: Neck supple.     Right lower leg: No edema.     Left lower leg: No edema.     Comments: : Kyphosis    Lymphadenopathy:     Cervical: No cervical adenopathy.  Skin:    General: Skin is warm and dry.  Neurological:     Mental Status: [name] is alert and oriented to person, place, and time.  Psychiatric:        Mood and Affect: Mood normal.     ASSESSMENT/ PLAN:  TODAY  Severe protein calorie malnutrition Acute renal failure super imposed on stage 3b chronic renal disease unspecified renal failure type:   Will stop lisinopril  will repeat 05-10-21 Will begin prostat 30 mL twice daily    Ok Edwards NP [facility]  call [phone]

## 2021-05-07 DIAGNOSIS — M6281 Muscle weakness (generalized): Secondary | ICD-10-CM | POA: Diagnosis not present

## 2021-05-07 DIAGNOSIS — I5043 Acute on chronic combined systolic (congestive) and diastolic (congestive) heart failure: Secondary | ICD-10-CM | POA: Diagnosis not present

## 2021-05-07 DIAGNOSIS — R262 Difficulty in walking, not elsewhere classified: Secondary | ICD-10-CM | POA: Diagnosis not present

## 2021-05-07 DIAGNOSIS — Z950 Presence of cardiac pacemaker: Secondary | ICD-10-CM | POA: Diagnosis not present

## 2021-05-07 DIAGNOSIS — R278 Other lack of coordination: Secondary | ICD-10-CM | POA: Diagnosis not present

## 2021-05-07 DIAGNOSIS — J9601 Acute respiratory failure with hypoxia: Secondary | ICD-10-CM | POA: Diagnosis not present

## 2021-05-10 ENCOUNTER — Other Ambulatory Visit (HOSPITAL_COMMUNITY)
Admission: RE | Admit: 2021-05-10 | Discharge: 2021-05-10 | Disposition: A | Discharge status: Home / Self Care | Payer: Medicare Other | Source: Skilled Nursing Facility | Attending: Adult Health | Admitting: Adult Health

## 2021-05-10 DIAGNOSIS — I5043 Acute on chronic combined systolic (congestive) and diastolic (congestive) heart failure: Secondary | ICD-10-CM | POA: Insufficient documentation

## 2021-05-10 DIAGNOSIS — E43 Unspecified severe protein-calorie malnutrition: Secondary | ICD-10-CM | POA: Insufficient documentation

## 2021-05-10 DIAGNOSIS — I10 Essential (primary) hypertension: Secondary | ICD-10-CM | POA: Insufficient documentation

## 2021-05-10 DIAGNOSIS — N179 Acute kidney failure, unspecified: Secondary | ICD-10-CM | POA: Insufficient documentation

## 2021-05-10 DIAGNOSIS — R262 Difficulty in walking, not elsewhere classified: Secondary | ICD-10-CM | POA: Diagnosis not present

## 2021-05-10 DIAGNOSIS — N183 Chronic kidney disease, stage 3 unspecified: Secondary | ICD-10-CM | POA: Insufficient documentation

## 2021-05-10 DIAGNOSIS — Z950 Presence of cardiac pacemaker: Secondary | ICD-10-CM | POA: Diagnosis not present

## 2021-05-10 DIAGNOSIS — J9601 Acute respiratory failure with hypoxia: Secondary | ICD-10-CM | POA: Diagnosis not present

## 2021-05-10 DIAGNOSIS — R278 Other lack of coordination: Secondary | ICD-10-CM | POA: Diagnosis not present

## 2021-05-10 DIAGNOSIS — M6281 Muscle weakness (generalized): Secondary | ICD-10-CM | POA: Diagnosis not present

## 2021-05-10 LAB — BASIC METABOLIC PANEL
Anion gap: 7 (ref 5–15)
BUN: 68 mg/dL — ABNORMAL HIGH (ref 8–23)
CO2: 34 mmol/L — ABNORMAL HIGH (ref 22–32)
Calcium: 8.9 mg/dL (ref 8.9–10.3)
Chloride: 95 mmol/L — ABNORMAL LOW (ref 98–111)
Creatinine, Ser: 1.5 mg/dL — ABNORMAL HIGH (ref 0.44–1.00)
GFR, Estimated: 32 mL/min — ABNORMAL LOW (ref >60)
Glucose, Bld: 99 mg/dL (ref 70–99)
Potassium: 4.3 mmol/L (ref 3.5–5.1)
Sodium: 136 mmol/L (ref 135–145)

## 2021-05-10 LAB — BRAIN NATRIURETIC PEPTIDE: B Natriuretic Peptide: 217 pg/mL — ABNORMAL HIGH (ref 0.0–100.0)

## 2021-05-10 LAB — SEDIMENTATION RATE: Sed Rate: 58 mm/hr — ABNORMAL HIGH (ref 0–22)

## 2021-05-10 LAB — C-REACTIVE PROTEIN: CRP: 1 mg/dL — ABNORMAL HIGH (ref <1.0)

## 2021-05-11 DIAGNOSIS — I5043 Acute on chronic combined systolic (congestive) and diastolic (congestive) heart failure: Secondary | ICD-10-CM | POA: Diagnosis not present

## 2021-05-11 DIAGNOSIS — M6281 Muscle weakness (generalized): Secondary | ICD-10-CM | POA: Diagnosis not present

## 2021-05-11 DIAGNOSIS — R278 Other lack of coordination: Secondary | ICD-10-CM | POA: Diagnosis not present

## 2021-05-11 DIAGNOSIS — Z950 Presence of cardiac pacemaker: Secondary | ICD-10-CM | POA: Diagnosis not present

## 2021-05-11 DIAGNOSIS — J9601 Acute respiratory failure with hypoxia: Secondary | ICD-10-CM | POA: Diagnosis not present

## 2021-05-11 DIAGNOSIS — R262 Difficulty in walking, not elsewhere classified: Secondary | ICD-10-CM | POA: Diagnosis not present

## 2021-05-12 DIAGNOSIS — R278 Other lack of coordination: Secondary | ICD-10-CM | POA: Diagnosis not present

## 2021-05-12 DIAGNOSIS — M6281 Muscle weakness (generalized): Secondary | ICD-10-CM | POA: Diagnosis not present

## 2021-05-12 DIAGNOSIS — J9601 Acute respiratory failure with hypoxia: Secondary | ICD-10-CM | POA: Diagnosis not present

## 2021-05-12 DIAGNOSIS — I5043 Acute on chronic combined systolic (congestive) and diastolic (congestive) heart failure: Secondary | ICD-10-CM | POA: Diagnosis not present

## 2021-05-12 DIAGNOSIS — Z950 Presence of cardiac pacemaker: Secondary | ICD-10-CM | POA: Diagnosis not present

## 2021-05-12 DIAGNOSIS — R262 Difficulty in walking, not elsewhere classified: Secondary | ICD-10-CM | POA: Diagnosis not present

## 2021-05-13 DIAGNOSIS — I5043 Acute on chronic combined systolic (congestive) and diastolic (congestive) heart failure: Secondary | ICD-10-CM | POA: Diagnosis not present

## 2021-05-13 DIAGNOSIS — Z1159 Encounter for screening for other viral diseases: Secondary | ICD-10-CM | POA: Diagnosis not present

## 2021-05-13 DIAGNOSIS — Z950 Presence of cardiac pacemaker: Secondary | ICD-10-CM | POA: Diagnosis not present

## 2021-05-13 DIAGNOSIS — J9621 Acute and chronic respiratory failure with hypoxia: Secondary | ICD-10-CM | POA: Diagnosis not present

## 2021-05-13 DIAGNOSIS — R262 Difficulty in walking, not elsewhere classified: Secondary | ICD-10-CM | POA: Diagnosis not present

## 2021-05-13 DIAGNOSIS — J9601 Acute respiratory failure with hypoxia: Secondary | ICD-10-CM | POA: Diagnosis not present

## 2021-05-13 DIAGNOSIS — R278 Other lack of coordination: Secondary | ICD-10-CM | POA: Diagnosis not present

## 2021-05-13 DIAGNOSIS — M6281 Muscle weakness (generalized): Secondary | ICD-10-CM | POA: Diagnosis not present

## 2021-05-14 ENCOUNTER — Non-Acute Institutional Stay (SKILLED_NURSING_FACILITY): Payer: Medicare Other | Admitting: Adult Health

## 2021-05-14 ENCOUNTER — Encounter: Payer: Self-pay | Admitting: Adult Health

## 2021-05-14 DIAGNOSIS — J9611 Chronic respiratory failure with hypoxia: Secondary | ICD-10-CM

## 2021-05-14 DIAGNOSIS — I700 Atherosclerosis of aorta: Secondary | ICD-10-CM | POA: Diagnosis not present

## 2021-05-14 DIAGNOSIS — R278 Other lack of coordination: Secondary | ICD-10-CM | POA: Diagnosis not present

## 2021-05-14 DIAGNOSIS — J9601 Acute respiratory failure with hypoxia: Secondary | ICD-10-CM | POA: Diagnosis not present

## 2021-05-14 DIAGNOSIS — R262 Difficulty in walking, not elsewhere classified: Secondary | ICD-10-CM | POA: Diagnosis not present

## 2021-05-14 DIAGNOSIS — I5043 Acute on chronic combined systolic (congestive) and diastolic (congestive) heart failure: Secondary | ICD-10-CM

## 2021-05-14 DIAGNOSIS — Z950 Presence of cardiac pacemaker: Secondary | ICD-10-CM | POA: Diagnosis not present

## 2021-05-14 DIAGNOSIS — M6281 Muscle weakness (generalized): Secondary | ICD-10-CM | POA: Diagnosis not present

## 2021-05-14 NOTE — Progress Notes (Signed)
Location:  [place] Room Number: 858-I Place of Service:  SNF (31)   CODE STATUS: DNR  Allergies  Allergen Reactions   Macrodantin Shortness Of Breath   Sulfa Antibiotics Shortness Of Breath    Chief Complaint  Patient presents with   Acute Visit    Care plan meeting    HPI:  We have come together for her care plan meeting. Family present. BIMS 15/15 mood 3/30: decreased energy and anxious. She requires limited assist with her adls. She is occasionally incontinent of urine frequently incontinent of bowel. She is nonambulatory there have been no falls. Dietary: weight is 100.6 pounds NAS diet; appetite variable but is improving. Therapy: walker in room 150 feet with supervision; BRP independent with wheelchair. Bathing upper and lower body self. She continues to be followed for her chronic illnesses including:  Acute on chronic diastolic and systolic congestive heart failure Aortic atherosclerosis  Chronic respiratory failure with hypoxia  Past Medical History:  Diagnosis Date   Cardiomyopathy ([place])    CHF (congestive heart failure) (HCC)    Coronary artery disease    GERD (gastroesophageal reflux disease)    History of sick sinus syndrome    Hypertension    Hypothyroidism    Paroxysmal atrial fibrillation (HCC)    Shortness of breath dyspnea    Thyroid disease     Past Surgical History:  Procedure Laterality Date   APPENDECTOMY     CARDIAC CATHETERIZATION  07/25/2005   CATARACT EXTRACTION W/PHACO Right 08/21/2014   Procedure: CATARACT EXTRACTION PHACO AND INTRAOCULAR LENS PLACEMENT ([place]);  Surgeon: Tonny Branch, MD;  Location: [facility];  Service: Ophthalmology;  Laterality: Right;  CDE 10.94   CATARACT EXTRACTION W/PHACO Left 09/08/2014   Procedure: CATARACT EXTRACTION PHACO AND INTRAOCULAR LENS PLACEMENT ([facility]);  Surgeon: Tonny Branch, MD;  Location: [facility];  Service: Ophthalmology;  Laterality: Left;  CDE:7.79   CHOLECYSTECTOMY     KNEE ARTHROSCOPY  Right    NM MYOVIEW LTD  11/06/2008   PACEMAKER PLACEMENT      Social History   Socioeconomic History   Marital status: Widowed    Spouse name: Not on file   Number of children: Not on file   Years of education: Not on file   Highest education level: Not on file  Occupational History   Not on file  Tobacco Use   Smoking status: Former    Packs/day: 0.25    Years: 15.00    Pack years: 3.75    Types: Cigarettes    Quit date: 08/13/1970    Years since quitting: 50.7   Smokeless tobacco: Never  Vaping Use   Vaping Use: Never used  Substance and Sexual Activity   Alcohol use: No    Alcohol/week: 0.0 standard drinks   Drug use: No   Sexual activity: Never    Birth control/protection: Post-menopausal  Other Topics Concern   Not on file  Social History Narrative   Not on file   Social Determinants of Health   Financial Resource Strain: Not on file  Food Insecurity: Not on file  Transportation Needs: Not on file  Physical Activity: Not on file  Stress: Not on file  Social Connections: Not on file  Intimate Partner Violence: Not on file   Family History  Problem Relation Age of Onset   Heart disease Other    Cancer Other    Kidney disease Other       VITAL SIGNS BP 100/62  Pulse 70    Temp (!) 97.1 F (36.2 C)    Resp 18    Ht '5\' 3"'  (1.6 m)    Wt 100 lb 3.2 oz (45.5 kg)    SpO2 96%    BMI 17.75 kg/m   Outpatient Encounter Medications as of 05/14/2021  Medication Sig   acetaminophen (TYLENOL) 325 MG tablet Take 2 tablets (650 mg total) by mouth every 6 (six) hours as needed for mild pain (or Fever >/= 101).   Amino Acids-Protein Hydrolys (FEEDING SUPPLEMENT, PRO-STAT SUGAR FREE 64,) LIQD Take 30 mLs by mouth 2 (two) times daily.   apixaban (ELIQUIS) 2.5 MG TABS tablet Take 1 tablet (2.5 mg total) by mouth 2 (two) times daily.   carvedilol (COREG) 6.25 MG tablet Take 1 tablet (6.25 mg total) by mouth 2 (two) times daily with a meal.   digoxin (LANOXIN) 0.125 MG  tablet Take one tablet on Monday - Friday and None on Saturday or Sunday   ergocalciferol (VITAMIN D2) 1.25 MG (50000 UT) capsule Take 50,000 Units by mouth once a week.   furosemide (LASIX) 20 MG tablet Take 20 mg by mouth 2 (two) times a week.    hydrALAZINE (APRESOLINE) 100 MG tablet Take 1 tablet (100 mg total) by mouth 2 (two) times daily.   ipratropium-albuterol (DUONEB) 0.5-2.5 (3) MG/3ML SOLN Take 3 mLs by nebulization every 6 (six) hours as needed. SOB  DX: Acute respiratory failure with hypoxia   isosorbide mononitrate (IMDUR) 30 MG 24 hr tablet Take 1 tablet (30 mg total) by mouth daily.   levocetirizine (XYZAL) 5 MG tablet Take 5 mg by mouth every morning.    levothyroxine (SYNTHROID, LEVOTHROID) 88 MCG tablet Take 88 mcg by mouth daily before breakfast.    melatonin 1 MG TABS tablet Take 1 mg by mouth at bedtime.   Multiple Vitamins-Minerals (PRESERVISION AREDS PO) Take 1 tablet by mouth 2 (two) times daily.   Nutritional Supplements (ENSURE ENLIVE PO) 120 ml BID with medpass due to variable intake and malnutrition risk. Twice A Day Between Meals   Omega-3 Fatty Acids (FISH OIL) 1000 MG CAPS Take 300-1,000 mg by mouth daily.   omeprazole (PRILOSEC) 20 MG capsule Take 20 mg by mouth daily.   Polyethyl Glycol-Propyl Glycol (SYSTANE OP) Place 1 drop into both eyes 3 (three) times daily.    Potassium 99 MG TABS Take 1 tablet by mouth 2 (two) times daily.    VENTOLIN HFA 108 (90 BASE) MCG/ACT inhaler Inhale 2 puffs into the lungs every 4 (four) hours as needed for wheezing or shortness of breath.    vitamin C (ASCORBIC ACID) 500 MG tablet Take 500 mg by mouth every morning.    [DISCONTINUED] ALPRAZolam (XANAX) 0.5 MG tablet Take 1 tablet (0.5 mg total) by mouth 3 (three) times daily as needed.   [DISCONTINUED] ipratropium (ATROVENT) 0.03 % nasal spray ipratropium bromide 21 mcg (0.03 %) nasal spray   No facility-administered encounter medications on file as of 05/14/2021.      SIGNIFICANT DIAGNOSTIC EXAMS  PREVIOUS   04-24-21: chest x-ray:  1. The appearance the chest suggests mild congestive heart failure, as above. 2. Aortic atherosclerosis.  04-24-21: 2-d echo 1. Left ventricular ejection fraction, by estimation, is 20 to 25%. The  left ventricle has severely decreased function. The left ventricle  demonstrates global hypokinesis. Left ventricular diastolic parameters are  consistent with Grade I diastolic  dysfunction (impaired relaxation).   04-26-21: chest x-ray:  Cardiomegaly. COPD. There is decrease  in pulmonary vascular congestion and decrease in interstitial pulmonary edema. Small bilateral pleural effusions.  NO NEW EXAMS  LABS REVIEWED:   04-24-21: wbc 18.4; hgb 13.0; hct 42.3; mcv 97.0 plt 190; glucose 158; bun 32; creat 1.12; k+ 4.8; na++ 137; ca 9.3 GFR 46; ast 108 alt 160 alk phos 143; BNP 2877.0 dig 0.9 blood culture: no growth  urine culture: e-coli  rocephin/cipro  04-27-21: wbc 17.0 hgb 10.9; hct 34.6; mcv 98.6 plt 173; glucose 103; bun 36; creat 1.12; k+ 3.7; na++ 137; ca 8.8; GFR 46.  04-28-21: wbc 19.1; hgb 12.1; hct 38.9; mcv 96; plt 197; glucose 112; bun 39; creat 1.20 ;k+ 3.1; n++ 134; ca 9.1; GFR 42 phos 2.9; albumin 2.8  05-06-21: wbc 7.7; hgb 11.2; hct 34.8; mcv 996.4 plt 241; glucose 98; bun 59; creat 1.63; k+ 4.2 na++ 135; ca 8.8; GFR 29 ;liver normal albumin 2.8   NO NEW LABS.   Review of Systems  Constitutional:  Negative for malaise/fatigue.  Respiratory:  Negative for cough and shortness of breath.   Cardiovascular:  Negative for chest pain, palpitations and leg swelling.  Gastrointestinal:  Negative for abdominal pain, constipation and heartburn.  Musculoskeletal:  Negative for back pain, joint pain and myalgias.  Skin: Negative.   Neurological:  Negative for dizziness.  Psychiatric/Behavioral:  The patient is not nervous/anxious.    Physical Exam Constitutional:      General: She is not in acute distress.     Appearance: She is well-developed. She is not diaphoretic.  Neck:     Thyroid: No thyromegaly.  Cardiovascular:     Rate and Rhythm: Normal rate and regular rhythm.     Heart sounds: Normal heart sounds.     Comments: Pace maker  Pulmonary:     Effort: Pulmonary effort is normal. No respiratory distress.     Breath sounds: Normal breath sounds.     Comments: 02 Abdominal:     General: Bowel sounds are normal. There is no distension.     Palpations: Abdomen is soft.     Tenderness: There is no abdominal tenderness.  Musculoskeletal:        General: Normal range of motion.     Cervical back: Neck supple.     Right lower leg: No edema.     Left lower leg: No edema.     Comments: Kyphosis   Lymphadenopathy:     Cervical: No cervical adenopathy.  Skin:    General: Skin is warm and dry.  Neurological:     Mental Status: She is alert and oriented to person, place, and time.  Psychiatric:        Mood and Affect: Mood normal.      ASSESSMENT/ PLAN:  Acute on chronic diastolic and systolic congestive heart failure Aortic atherosclerosis Chronic respiratory failure with hypoxia  Will attempt another wean off 02 Will continue therapy as direct Will continue to monitor her status.    Time spent with patient 40 minutes: goals of care; care plan; medications.    Ok Edwards NP [facility]  call [phone]

## 2021-05-17 ENCOUNTER — Other Ambulatory Visit (HOSPITAL_COMMUNITY)
Admission: RE | Admit: 2021-05-17 | Discharge: 2021-05-17 | Disposition: A | Discharge status: Home / Self Care | Payer: Medicare Other | Source: Skilled Nursing Facility | Attending: Adult Health | Admitting: Adult Health

## 2021-05-17 DIAGNOSIS — J9601 Acute respiratory failure with hypoxia: Secondary | ICD-10-CM | POA: Diagnosis not present

## 2021-05-17 DIAGNOSIS — E039 Hypothyroidism, unspecified: Secondary | ICD-10-CM | POA: Insufficient documentation

## 2021-05-17 DIAGNOSIS — R278 Other lack of coordination: Secondary | ICD-10-CM | POA: Diagnosis not present

## 2021-05-17 DIAGNOSIS — Z950 Presence of cardiac pacemaker: Secondary | ICD-10-CM | POA: Diagnosis not present

## 2021-05-17 DIAGNOSIS — I5043 Acute on chronic combined systolic (congestive) and diastolic (congestive) heart failure: Secondary | ICD-10-CM | POA: Diagnosis not present

## 2021-05-17 DIAGNOSIS — R262 Difficulty in walking, not elsewhere classified: Secondary | ICD-10-CM | POA: Diagnosis not present

## 2021-05-17 DIAGNOSIS — M6281 Muscle weakness (generalized): Secondary | ICD-10-CM | POA: Diagnosis not present

## 2021-05-17 LAB — TSH: TSH: 15.607 u[IU]/mL — ABNORMAL HIGH (ref 0.350–4.500)

## 2021-05-18 ENCOUNTER — Non-Acute Institutional Stay (SKILLED_NURSING_FACILITY): Payer: Medicare Other | Admitting: Adult Health

## 2021-05-18 ENCOUNTER — Encounter: Payer: Self-pay | Admitting: Adult Health

## 2021-05-18 ENCOUNTER — Other Ambulatory Visit: Payer: Self-pay | Admitting: Adult Health

## 2021-05-18 DIAGNOSIS — R278 Other lack of coordination: Secondary | ICD-10-CM | POA: Diagnosis not present

## 2021-05-18 DIAGNOSIS — N179 Acute kidney failure, unspecified: Secondary | ICD-10-CM

## 2021-05-18 DIAGNOSIS — R262 Difficulty in walking, not elsewhere classified: Secondary | ICD-10-CM | POA: Diagnosis not present

## 2021-05-18 DIAGNOSIS — Z950 Presence of cardiac pacemaker: Secondary | ICD-10-CM | POA: Diagnosis not present

## 2021-05-18 DIAGNOSIS — N1832 Chronic kidney disease, stage 3b: Secondary | ICD-10-CM

## 2021-05-18 DIAGNOSIS — I5043 Acute on chronic combined systolic (congestive) and diastolic (congestive) heart failure: Secondary | ICD-10-CM | POA: Diagnosis not present

## 2021-05-18 DIAGNOSIS — I700 Atherosclerosis of aorta: Secondary | ICD-10-CM

## 2021-05-18 DIAGNOSIS — Z66 Do not resuscitate: Secondary | ICD-10-CM | POA: Diagnosis not present

## 2021-05-18 DIAGNOSIS — J9611 Chronic respiratory failure with hypoxia: Secondary | ICD-10-CM | POA: Diagnosis not present

## 2021-05-18 DIAGNOSIS — J9601 Acute respiratory failure with hypoxia: Secondary | ICD-10-CM | POA: Diagnosis not present

## 2021-05-18 DIAGNOSIS — M6281 Muscle weakness (generalized): Secondary | ICD-10-CM | POA: Diagnosis not present

## 2021-05-18 MED ORDER — LEVOTHYROXINE SODIUM 112 MCG PO TABS: 112.0000 ug | ORAL_TABLET | Freq: Every day | ORAL | 0 refills | Status: AC

## 2021-05-18 MED ORDER — HYDRALAZINE HCL 100 MG PO TABS: 100.0000 mg | ORAL_TABLET | Freq: Two times a day (BID) | ORAL | 0 refills | Status: AC

## 2021-05-18 MED ORDER — APIXABAN 2.5 MG PO TABS: 2.5000 mg | ORAL_TABLET | Freq: Two times a day (BID) | ORAL | 0 refills | Status: AC

## 2021-05-18 MED ORDER — ISOSORBIDE MONONITRATE ER 30 MG PO TB24: 30.0000 mg | ORAL_TABLET | Freq: Every day | ORAL | 0 refills | Status: AC

## 2021-05-18 MED ORDER — CARVEDILOL 6.25 MG PO TABS: 6.2500 mg | ORAL_TABLET | Freq: Two times a day (BID) | ORAL | 0 refills | Status: AC

## 2021-05-18 MED ORDER — VENTOLIN HFA 108 (90 BASE) MCG/ACT IN AERS: 2.0000 | INHALATION_SPRAY | RESPIRATORY_TRACT | 0 refills | Status: AC | PRN

## 2021-05-18 MED ORDER — DIGOXIN 125 MCG PO TABS: ORAL_TABLET | ORAL | 0 refills | Status: DC

## 2021-05-18 MED ORDER — ERGOCALCIFEROL 1.25 MG (50000 UT) PO CAPS: 50000.0000 [IU] | ORAL_CAPSULE | ORAL | 0 refills | Status: AC

## 2021-05-18 MED ORDER — FUROSEMIDE 20 MG PO TABS
20.0000 mg | ORAL_TABLET | ORAL | 0 refills | Status: AC
Start: 2021-05-20 — End: ?

## 2021-05-18 NOTE — Progress Notes (Signed)
Location:  [facility] Room Number: 638-L Place of Service:  SNF (31)   CODE STATUS: DNR  Allergies  Allergen Reactions   Macrodantin Shortness Of Breath   Sulfa Antibiotics Shortness Of Breath    Chief Complaint  Patient presents with   Discharge Note    Discharge from [facility]     HPI:  She is being discharged to home with home health for pt/ot/rn. She will need home 02 for a room air sat of 84%. Her tsh is 15.607; will increase her synthroid from 88 mcg daily to 112 mcg daily. She will need her prescriptions written and will need to follow up with her medical provider. She had been hospitalized for acute on chronic heart failure with respiratory failure. She has participated in both pt/ot to increase her independence with her adls. She is now ready for discharge.   Past Medical History:  Diagnosis Date   Cardiomyopathy [facility])    CHF (congestive heart failure) (HCC)    Coronary artery disease    GERD (gastroesophageal reflux disease)    History of sick sinus syndrome    Hypertension    Hypothyroidism    Paroxysmal atrial fibrillation (HCC)    Shortness of breath dyspnea    Thyroid disease     Past Surgical History:  Procedure Laterality Date   APPENDECTOMY     CARDIAC CATHETERIZATION  07/25/2005   CATARACT EXTRACTION W/PHACO Right 08/21/2014   Procedure: CATARACT EXTRACTION PHACO AND INTRAOCULAR LENS PLACEMENT ([place]);  Surgeon: Tonny Branch, MD;  Location: [facility];  Service: Ophthalmology;  Laterality: Right;  CDE 10.94   CATARACT EXTRACTION W/PHACO Left 09/08/2014   Procedure: CATARACT EXTRACTION PHACO AND INTRAOCULAR LENS PLACEMENT ([facility]);  Surgeon: Tonny Branch, MD;  Location: [facility];  Service: Ophthalmology;  Laterality: Left;  CDE:7.79   CHOLECYSTECTOMY     KNEE ARTHROSCOPY Right    NM MYOVIEW LTD  11/06/2008   PACEMAKER PLACEMENT      Social History   Socioeconomic History   Marital status: Widowed    Spouse name: Not on file    Number of children: Not on file   Years of education: Not on file   Highest education level: Not on file  Occupational History   Not on file  Tobacco Use   Smoking status: Former    Packs/day: 0.25    Years: 15.00    Pack years: 3.75    Types: Cigarettes    Quit date: 08/13/1970    Years since quitting: 50.7   Smokeless tobacco: Never  Vaping Use   Vaping Use: Never used  Substance and Sexual Activity   Alcohol use: No    Alcohol/week: 0.0 standard drinks   Drug use: No   Sexual activity: Never    Birth control/protection: Post-menopausal  Other Topics Concern   Not on file  Social History Narrative   Not on file   Social Determinants of Health   Financial Resource Strain: Not on file  Food Insecurity: Not on file  Transportation Needs: Not on file  Physical Activity: Not on file  Stress: Not on file  Social Connections: Not on file  Intimate Partner Violence: Not on file   Family History  Problem Relation Age of Onset   Heart disease Other    Cancer Other    Kidney disease Other       VITAL SIGNS BP 123/68    Pulse 76    Temp (!) 97.2 F (36.2  C)    Resp 20    Ht 5' 3" (1.6 m)    Wt 100 lb (45.4 kg)    SpO2 (!) 89%    BMI 17.71 kg/m   Outpatient Encounter Medications as of 05/18/2021  Medication Sig   acetaminophen (TYLENOL) 325 MG tablet Take 2 tablets (650 mg total) by mouth every 6 (six) hours as needed for mild pain (or Fever >/= 101).   Amino Acids-Protein Hydrolys (FEEDING SUPPLEMENT, PRO-STAT SUGAR FREE 64,) LIQD Take 30 mLs by mouth 2 (two) times daily.   apixaban (ELIQUIS) 2.5 MG TABS tablet Take 1 tablet (2.5 mg total) by mouth 2 (two) times daily.   carvedilol (COREG) 6.25 MG tablet Take 1 tablet (6.25 mg total) by mouth 2 (two) times daily with a meal.   digoxin (LANOXIN) 0.125 MG tablet Take one tablet on Monday - Friday and None on Saturday or Sunday   ergocalciferol (VITAMIN D2) 1.25 MG (50000 UT) capsule Take 50,000 Units by mouth once a week.    furosemide (LASIX) 20 MG tablet Take 20 mg by mouth 2 (two) times a week.    hydrALAZINE (APRESOLINE) 100 MG tablet Take 1 tablet (100 mg total) by mouth 2 (two) times daily.   isosorbide mononitrate (IMDUR) 30 MG 24 hr tablet Take 1 tablet (30 mg total) by mouth daily.   levocetirizine (XYZAL) 5 MG tablet Take 5 mg by mouth every morning.    levothyroxine (SYNTHROID, LEVOTHROID) 88 MCG tablet Take 88 mcg by mouth daily before breakfast.    melatonin 1 MG TABS tablet Take 1 mg by mouth at bedtime.   Multiple Vitamins-Minerals (PRESERVISION AREDS PO) Take 1 tablet by mouth 2 (two) times daily.   Nutritional Supplements (ENSURE ENLIVE PO) 120 ml BID with medpass due to variable intake and malnutrition risk. Twice A Day Between Meals   Omega-3 Fatty Acids (FISH OIL) 1000 MG CAPS Take 300-1,000 mg by mouth daily.   omeprazole (PRILOSEC) 20 MG capsule Take 20 mg by mouth daily.   Polyethyl Glycol-Propyl Glycol (SYSTANE OP) Place 1 drop into both eyes 3 (three) times daily.    Potassium 99 MG TABS Take 1 tablet by mouth 2 (two) times daily.    VENTOLIN HFA 108 (90 BASE) MCG/ACT inhaler Inhale 2 puffs into the lungs every 4 (four) hours as needed for wheezing or shortness of breath.    vitamin C (ASCORBIC ACID) 500 MG tablet Take 500 mg by mouth every morning.    [DISCONTINUED] ipratropium-albuterol (DUONEB) 0.5-2.5 (3) MG/3ML SOLN Take 3 mLs by nebulization every 6 (six) hours as needed. SOB  DX: Acute respiratory failure with hypoxia   No facility-administered encounter medications on file as of 05/18/2021.     SIGNIFICANT DIAGNOSTIC EXAMS  PREVIOUS   04-24-21: chest x-ray:  1. The appearance the chest suggests mild congestive heart failure, as above. 2. Aortic atherosclerosis.  04-24-21: 2-d echo 1. Left ventricular ejection fraction, by estimation, is 20 to 25%. The  left ventricle has severely decreased function. The left ventricle  demonstrates global hypokinesis. Left ventricular  diastolic parameters are  consistent with Grade I diastolic  dysfunction (impaired relaxation).   04-26-21: chest x-ray:  Cardiomegaly. COPD. There is decrease in pulmonary vascular congestion and decrease in interstitial pulmonary edema. Small bilateral pleural effusions.  NO NEW EXAMS  LABS REVIEWED:   04-24-21: wbc 18.4; hgb 13.0; hct 42.3; mcv 97.0 plt 190; glucose 158; bun 32; creat 1.12; k+ 4.8; na++ 137; ca 9.3 GFR 46; ast  phos 143; BNP 2877.0 dig 0.9 blood culture: no growth  urine culture: e-coli  rocephin/cipro  °04-27-21: wbc 17.0 hgb 10.9; hct 34.6; mcv 98.6 plt 173; glucose 103; bun 36; creat 1.12; k+ 3.7; na++ 137; ca 8.8; GFR 46.  °04-28-21: wbc 19.1; hgb 12.1; hct 38.9; mcv 96; plt 197; glucose 112; bun 39; creat 1.20 ;k+ 3.1; n++ 134; ca 9.1; GFR 42 phos 2.9; albumin 2.8  °05-06-21: wbc 7.7; hgb 11.2; hct 34.8; mcv 996.4 plt 241; glucose 98; bun 59; creat 1.63; k+ 4.2 na++ 135; ca 8.8; GFR 29 ;liver normal albumin 2.8  ° °NO NEW LABS.  ° °Review of Systems  °Constitutional:  Negative for malaise/fatigue.  °Respiratory:  Negative for cough and shortness of breath.   °Cardiovascular:  Negative for chest pain, palpitations and leg swelling.  °Gastrointestinal:  Negative for abdominal pain, constipation and heartburn.  °Musculoskeletal:  Negative for back pain, joint pain and myalgias.  °Skin: Negative.   °Neurological:  Negative for dizziness.  °Psychiatric/Behavioral:  The patient is not nervous/anxious.   ° °Physical Exam °Constitutional:   °   General: She is not in acute distress. °   Appearance: She is well-developed. She is not diaphoretic.  °Neck:  °   Thyroid: No thyromegaly.  °Cardiovascular:  °   Rate and Rhythm: Normal rate and regular rhythm.  °   Heart sounds: Normal heart sounds.  °   Comments: Pace maker  °Pulmonary:  °   Effort: Pulmonary effort is normal. No respiratory distress.  °   Breath sounds: Normal breath sounds.  °   Comments: 02 °Abdominal:  °   General:  Bowel sounds are normal. There is no distension.  °   Palpations: Abdomen is soft.  °   Tenderness: There is no abdominal tenderness.  °Musculoskeletal:     °   General: Normal range of motion.  °   Cervical back: Neck supple.  °   Right lower leg: No edema.  °   Left lower leg: No edema.  °   Comments: Kyphosis   °Lymphadenopathy:  °   Cervical: No cervical adenopathy.  °Skin: °   General: Skin is warm and dry.  °Neurological:  °   Mental Status: She is alert and oriented to person, place, and time.  °Psychiatric:     °   Mood and Affect: Mood normal.  ° ° °ASSESSMENT/ PLAN: ° ° °Patient is being discharged with the following home health services:  pt/ot/rn: to evaluate and treat as indicated for gait balance strength adl training medication management  ° °Patient is being discharged with the following durable medical equipment:  needs home 02 for her room air 02 sat of 84%.  ° °Patient has been advised to f/u with their PCP in 1-2 weeks to for a transitions of care visit.  Social services at their facility was responsible for arranging this appointment.  Pt was provided with adequate prescriptions of noncontrolled medications to reach the scheduled appointment .  For controlled substances, a limited supply was provided as appropriate for the individual patient.  If the pt normally receives these medications from a pain clinic or has a contract with another physician, these medications should be received from that clinic or physician only).   ° °A 30 day supply of her prescription medications have been sent to [facility]  ° ° °Time spent with patient 35 minutes: medications; dme;therapy.  ° °  NP °[facility]  ° call [phone]  ° °

## 2021-05-19 ENCOUNTER — Telehealth: Payer: Self-pay

## 2021-05-19 DIAGNOSIS — I5033 Acute on chronic diastolic (congestive) heart failure: Secondary | ICD-10-CM | POA: Diagnosis not present

## 2021-05-19 DIAGNOSIS — I5023 Acute on chronic systolic (congestive) heart failure: Secondary | ICD-10-CM | POA: Diagnosis not present

## 2021-05-19 DIAGNOSIS — J9601 Acute respiratory failure with hypoxia: Secondary | ICD-10-CM | POA: Diagnosis not present

## 2021-05-19 NOTE — Telephone Encounter (Signed)
Pt caregiver called to set up an appointment to for her home remote check. I scheduled it for Friday.

## 2021-05-21 ENCOUNTER — Ambulatory Visit (INDEPENDENT_AMBULATORY_CARE_PROVIDER_SITE_OTHER): Payer: Medicare Other

## 2021-05-21 DIAGNOSIS — E43 Unspecified severe protein-calorie malnutrition: Secondary | ICD-10-CM | POA: Diagnosis not present

## 2021-05-21 DIAGNOSIS — I2510 Atherosclerotic heart disease of native coronary artery without angina pectoris: Secondary | ICD-10-CM | POA: Diagnosis not present

## 2021-05-21 DIAGNOSIS — N1832 Chronic kidney disease, stage 3b: Secondary | ICD-10-CM | POA: Diagnosis not present

## 2021-05-21 DIAGNOSIS — I5042 Chronic combined systolic (congestive) and diastolic (congestive) heart failure: Secondary | ICD-10-CM | POA: Diagnosis not present

## 2021-05-21 DIAGNOSIS — I442 Atrioventricular block, complete: Secondary | ICD-10-CM | POA: Diagnosis not present

## 2021-05-21 DIAGNOSIS — N179 Acute kidney failure, unspecified: Secondary | ICD-10-CM | POA: Diagnosis not present

## 2021-05-21 DIAGNOSIS — J449 Chronic obstructive pulmonary disease, unspecified: Secondary | ICD-10-CM | POA: Diagnosis not present

## 2021-05-21 DIAGNOSIS — I130 Hypertensive heart and chronic kidney disease with heart failure and stage 1 through stage 4 chronic kidney disease, or unspecified chronic kidney disease: Secondary | ICD-10-CM | POA: Diagnosis not present

## 2021-05-21 DIAGNOSIS — I480 Paroxysmal atrial fibrillation: Secondary | ICD-10-CM | POA: Diagnosis not present

## 2021-05-21 DIAGNOSIS — H3530 Unspecified macular degeneration: Secondary | ICD-10-CM | POA: Diagnosis not present

## 2021-05-21 DIAGNOSIS — I429 Cardiomyopathy, unspecified: Secondary | ICD-10-CM | POA: Diagnosis not present

## 2021-05-21 LAB — CUP PACEART REMOTE DEVICE CHECK
Battery Impedance: 2979 Ohm
Battery Remaining Longevity: 23 mo
Battery Voltage: 2.73 V
Brady Statistic AP VP Percent: 83 %
Brady Statistic AP VS Percent: 0 %
Brady Statistic AS VP Percent: 17 %
Brady Statistic AS VS Percent: 0 %
Date Time Interrogation Session: 20230224090031
Implantable Lead Implant Date: 20110818
Implantable Lead Implant Date: 20110818
Implantable Lead Location: 753859
Implantable Lead Location: 753860
Implantable Lead Model: 5092
Implantable Lead Model: 5594
Implantable Pulse Generator Implant Date: 20110818
Lead Channel Impedance Value: 557 Ohm
Lead Channel Impedance Value: 578 Ohm
Lead Channel Pacing Threshold Amplitude: 0.5 V
Lead Channel Pacing Threshold Amplitude: 0.75 V
Lead Channel Pacing Threshold Pulse Width: 0.4 ms
Lead Channel Pacing Threshold Pulse Width: 0.4 ms
Lead Channel Setting Pacing Amplitude: 1.5 V
Lead Channel Setting Pacing Amplitude: 2 V
Lead Channel Setting Pacing Pulse Width: 0.4 ms
Lead Channel Setting Sensing Sensitivity: 2 mV

## 2021-05-25 NOTE — Progress Notes (Signed)
Remote pacemaker transmission.   

## 2021-05-26 DIAGNOSIS — E039 Hypothyroidism, unspecified: Secondary | ICD-10-CM | POA: Diagnosis not present

## 2021-05-26 DIAGNOSIS — I5022 Chronic systolic (congestive) heart failure: Secondary | ICD-10-CM | POA: Diagnosis not present

## 2021-05-26 DIAGNOSIS — I509 Heart failure, unspecified: Secondary | ICD-10-CM | POA: Diagnosis not present

## 2021-05-26 DIAGNOSIS — Z681 Body mass index (BMI) 19 or less, adult: Secondary | ICD-10-CM | POA: Diagnosis not present

## 2021-06-08 DIAGNOSIS — I130 Hypertensive heart and chronic kidney disease with heart failure and stage 1 through stage 4 chronic kidney disease, or unspecified chronic kidney disease: Secondary | ICD-10-CM | POA: Diagnosis not present

## 2021-06-08 DIAGNOSIS — N1832 Chronic kidney disease, stage 3b: Secondary | ICD-10-CM | POA: Diagnosis not present

## 2021-06-08 DIAGNOSIS — I5042 Chronic combined systolic (congestive) and diastolic (congestive) heart failure: Secondary | ICD-10-CM | POA: Diagnosis not present

## 2021-06-08 DIAGNOSIS — N179 Acute kidney failure, unspecified: Secondary | ICD-10-CM | POA: Diagnosis not present

## 2021-06-20 DIAGNOSIS — I2510 Atherosclerotic heart disease of native coronary artery without angina pectoris: Secondary | ICD-10-CM | POA: Diagnosis not present

## 2021-06-20 DIAGNOSIS — N1832 Chronic kidney disease, stage 3b: Secondary | ICD-10-CM | POA: Diagnosis not present

## 2021-06-20 DIAGNOSIS — J449 Chronic obstructive pulmonary disease, unspecified: Secondary | ICD-10-CM | POA: Diagnosis not present

## 2021-06-20 DIAGNOSIS — N179 Acute kidney failure, unspecified: Secondary | ICD-10-CM | POA: Diagnosis not present

## 2021-06-20 DIAGNOSIS — I429 Cardiomyopathy, unspecified: Secondary | ICD-10-CM | POA: Diagnosis not present

## 2021-06-20 DIAGNOSIS — K219 Gastro-esophageal reflux disease without esophagitis: Secondary | ICD-10-CM | POA: Diagnosis not present

## 2021-06-20 DIAGNOSIS — I480 Paroxysmal atrial fibrillation: Secondary | ICD-10-CM | POA: Diagnosis not present

## 2021-06-20 DIAGNOSIS — I700 Atherosclerosis of aorta: Secondary | ICD-10-CM | POA: Diagnosis not present

## 2021-06-20 DIAGNOSIS — F419 Anxiety disorder, unspecified: Secondary | ICD-10-CM | POA: Diagnosis not present

## 2021-06-20 DIAGNOSIS — I130 Hypertensive heart and chronic kidney disease with heart failure and stage 1 through stage 4 chronic kidney disease, or unspecified chronic kidney disease: Secondary | ICD-10-CM | POA: Diagnosis not present

## 2021-06-20 DIAGNOSIS — I495 Sick sinus syndrome: Secondary | ICD-10-CM | POA: Diagnosis not present

## 2021-06-20 DIAGNOSIS — E43 Unspecified severe protein-calorie malnutrition: Secondary | ICD-10-CM | POA: Diagnosis not present

## 2021-06-20 DIAGNOSIS — H3530 Unspecified macular degeneration: Secondary | ICD-10-CM | POA: Diagnosis not present

## 2021-06-20 DIAGNOSIS — E039 Hypothyroidism, unspecified: Secondary | ICD-10-CM | POA: Diagnosis not present

## 2021-06-20 DIAGNOSIS — E559 Vitamin D deficiency, unspecified: Secondary | ICD-10-CM | POA: Diagnosis not present

## 2021-06-20 DIAGNOSIS — I5042 Chronic combined systolic (congestive) and diastolic (congestive) heart failure: Secondary | ICD-10-CM | POA: Diagnosis not present

## 2021-07-05 DIAGNOSIS — Z681 Body mass index (BMI) 19 or less, adult: Secondary | ICD-10-CM | POA: Diagnosis not present

## 2021-07-05 DIAGNOSIS — E039 Hypothyroidism, unspecified: Secondary | ICD-10-CM | POA: Diagnosis not present

## 2021-07-08 ENCOUNTER — Encounter: Payer: Medicare Other | Admitting: Student

## 2021-07-15 ENCOUNTER — Encounter: Payer: Self-pay | Admitting: Internal Medicine

## 2021-07-15 ENCOUNTER — Ambulatory Visit: Payer: Medicare Other | Admitting: Internal Medicine

## 2021-07-15 VITALS — BP 120/50 | HR 78 | Ht 63.0 in | Wt 104.0 lb

## 2021-07-15 DIAGNOSIS — I5022 Chronic systolic (congestive) heart failure: Secondary | ICD-10-CM | POA: Diagnosis not present

## 2021-07-15 NOTE — Patient Instructions (Signed)
Medication Instructions:  Your physician recommends that you continue on your current medications as directed. Please refer to the Current Medication list given to you today.  *If you need a refill on your cardiac medications before your next appointment, please call your pharmacy*   Lab Work: NONE   If you have labs (blood work) drawn today and your tests are completely normal, you will receive your results only by: MyChart Message (if you have MyChart) OR A paper copy in the mail If you have any lab test that is abnormal or we need to change your treatment, we will call you to review the results.   Testing/Procedures: NONE    Follow-Up: At [facility], you and your health needs are our priority.  As part of our continuing mission to provide you with exceptional heart care, we have created designated Provider Care Teams.  These Care Teams include your primary Cardiologist (physician) and Advanced Practice Providers (APPs -  Physician Assistants and Nurse Practitioners) who all work together to provide you with the care you need, when you need it.  We recommend signing up for the patient portal called "MyChart".  Sign up information is provided on this After Visit Summary.  MyChart is used to connect with patients for Virtual Visits (Telemedicine).  Patients are able to view lab/test results, encounter notes, upcoming appointments, etc.  Non-urgent messages can be sent to your provider as well.   To learn more about what you can do with MyChart, go to [email or web address].    Your next appointment:   1 year(s)  The format for your next appointment:   In Person  Provider:   Gregg Taylor, MD    Other Instructions Thank you for choosing  [facility]!    Important Information About Sugar       

## 2021-07-15 NOTE — Progress Notes (Signed)
? ? ? ? ?HPI ?[name] returns today for followup. She is a pleasand 88 yo woman with a h/o HTN, SSS, chronic systolic heart failure and CAD. In the interim, she has been in the hospital with volume overload. Her weight is up a bit. She is on gdmt. She has minimal swelling. Her EF is 25% by echo. She has CHB and paces essentially 100%. She denies chest pain. She has class 2 CHF symptoms.  ?Allergies  ?Allergen Reactions  ? Macrodantin Shortness Of Breath  ? Sulfa Antibiotics Shortness Of Breath  ? ? ? ?Current Outpatient Medications  ?Medication Sig Dispense Refill  ? apixaban (ELIQUIS) 2.5 MG TABS tablet Take 1 tablet (2.5 mg total) by mouth 2 (two) times daily. 60 tablet 0  ? carvedilol (COREG) 6.25 MG tablet Take 1 tablet (6.25 mg total) by mouth 2 (two) times daily with a meal. 60 tablet 0  ? digoxin (LANOXIN) 0.125 MG tablet Take one tablet on Monday - Friday and None on Saturday or Sunday 30 tablet 0  ? ergocalciferol (VITAMIN D2) 1.25 MG (50000 UT) capsule Take 1 capsule (50,000 Units total) by mouth once a week. 4 capsule 0  ? furosemide (LASIX) 20 MG tablet Take 1 tablet (20 mg total) by mouth 2 (two) times a week. 8 tablet 0  ? hydrALAZINE (APRESOLINE) 100 MG tablet Take 1 tablet (100 mg total) by mouth 2 (two) times daily. 60 tablet 0  ? isosorbide mononitrate (IMDUR) 30 MG 24 hr tablet Take 1 tablet (30 mg total) by mouth daily. 30 tablet 0  ? levothyroxine (SYNTHROID) 112 MCG tablet Take 1 tablet (112 mcg total) by mouth daily. 30 tablet 0  ? VENTOLIN HFA 108 (90 Base) MCG/ACT inhaler Inhale 2 puffs into the lungs every 4 (four) hours as needed for wheezing or shortness of breath. 6.7 g 0  ? ?No current facility-administered medications for this visit.  ? ? ? ?Past Medical History:  ?Diagnosis Date  ? Cardiomyopathy ([place])   ? CHF (congestive heart failure) ([place])   ? Coronary artery disease   ? GERD (gastroesophageal reflux disease)   ? History of sick sinus syndrome   ? Hypertension   ? Hypothyroidism    ? Paroxysmal atrial fibrillation (HCC)   ? Shortness of breath dyspnea   ? Thyroid disease   ? ? ?ROS: ? ? All systems reviewed and negative except as noted in the HPI. ? ? ?Past Surgical History:  ?Procedure Laterality Date  ? APPENDECTOMY    ? CARDIAC CATHETERIZATION  07/25/2005  ? CATARACT EXTRACTION W/PHACO Right 08/21/2014  ? Procedure: CATARACT EXTRACTION PHACO AND INTRAOCULAR LENS PLACEMENT ([facility]);  Surgeon: Tonny Branch, MD;  Location: [facility];  Service: Ophthalmology;  Laterality: Right;  CDE 10.94  ? CATARACT EXTRACTION W/PHACO Left 09/08/2014  ? Procedure: CATARACT EXTRACTION PHACO AND INTRAOCULAR LENS PLACEMENT ([facility]);  Surgeon: Tonny Branch, MD;  Location: [facility];  Service: Ophthalmology;  Laterality: Left;  CDE:7.79  ? CHOLECYSTECTOMY    ? KNEE ARTHROSCOPY Right   ? NM MYOVIEW LTD  11/06/2008  ? PACEMAKER PLACEMENT    ? ? ? ?Family History  ?Problem Relation Age of Onset  ? Heart disease Other   ? Cancer Other   ? Kidney disease Other   ? ? ? ?Social History  ? ?Socioeconomic History  ? Marital status: Widowed  ?  Spouse name: Not on file  ? Number of children: Not on file  ? Years of education: Not on file  ?  Highest education level: Not on file  ?Occupational History  ? Not on file  ?Tobacco Use  ? Smoking status: Former  ?  Packs/day: 0.25  ?  Years: 15.00  ?  Pack years: 3.75  ?  Types: Cigarettes  ?  Quit date: 08/13/1970  ?  Years since quitting: 50.9  ? Smokeless tobacco: Never  ?Vaping Use  ? Vaping Use: Never used  ?Substance and Sexual Activity  ? Alcohol use: No  ?  Alcohol/week: 0.0 standard drinks  ? Drug use: No  ? Sexual activity: Never  ?  Birth control/protection: Post-menopausal  ?Other Topics Concern  ? Not on file  ?Social History Narrative  ? Not on file  ? ?Social Determinants of Health  ? ?Financial Resource Strain: Not on file  ?Food Insecurity: Not on file  ?Transportation Needs: Not on file  ?Physical Activity: Not on file  ?Stress: Not on file  ?Social Connections: Not on file   ?Intimate Partner Violence: Not on file  ? ? ? ?BP (!) 120/50   Pulse 78   Ht '5\' 3"'$  (1.6 m)   Wt 104 lb (47.2 kg)   SpO2 96%   BMI 18.42 kg/m?  ? ?Physical Exam: ? ?Well appearing NAD ?HEENT: Unremarkable ?Neck:  No JVD, no thyromegally ?Lymphatics:  No adenopathy ?Back:  No CVA tenderness ?Lungs:  Clear with no wheezes ?HEART:  Regular rate rhythm, no murmurs, no rubs, no clicks ?Abd:  soft, positive bowel sounds, no organomegally, no rebound, no guarding ?Ext:  2 plus pulses, no edema, no cyanosis, no clubbing ?Skin:  No rashes no nodules ?Neuro:  CN II through XII intact, motor grossly intact ? ?DEVICE  ?Normal device function.  See PaceArt for details.  ? ?Assess/Plan:  ?1. CHB - she is asymptomatic, s/p PPM insertion ?2. PPM - her medtronic DDD PM has about 1.7 years of battery longevity.  ?3. PAF - she had a couple of episodes when she was experiencing bronchitis. She will continue her systemic anti-coag.  ?4. Chronic systolic heart failure - she appears to be euvolemic on GDMT. If she were to have more volume overload it would be reasonable to consider adding either a CS or a left bundle area lead. ?  ?Carleene Overlie Tynisha Ogan,MD ?

## 2021-08-20 ENCOUNTER — Ambulatory Visit (INDEPENDENT_AMBULATORY_CARE_PROVIDER_SITE_OTHER): Payer: Medicare Other

## 2021-08-20 DIAGNOSIS — I480 Paroxysmal atrial fibrillation: Secondary | ICD-10-CM | POA: Diagnosis not present

## 2021-08-23 LAB — CUP PACEART REMOTE DEVICE CHECK
Battery Impedance: 3835 Ohm
Battery Remaining Longevity: 17 mo
Battery Voltage: 2.69 V
Brady Statistic AP VP Percent: 76 %
Brady Statistic AP VS Percent: 0 %
Brady Statistic AS VP Percent: 24 %
Brady Statistic AS VS Percent: 0 %
Date Time Interrogation Session: 20230526172611
Implantable Lead Implant Date: 20110818
Implantable Lead Implant Date: 20110818
Implantable Lead Location: 753859
Implantable Lead Location: 753860
Implantable Lead Model: 5092
Implantable Lead Model: 5594
Implantable Pulse Generator Implant Date: 20110818
Lead Channel Impedance Value: 551 Ohm
Lead Channel Impedance Value: 609 Ohm
Lead Channel Pacing Threshold Amplitude: 0.5 V
Lead Channel Pacing Threshold Amplitude: 0.875 V
Lead Channel Pacing Threshold Pulse Width: 0.4 ms
Lead Channel Pacing Threshold Pulse Width: 0.4 ms
Lead Channel Setting Pacing Amplitude: 1.5 V
Lead Channel Setting Pacing Amplitude: 2 V
Lead Channel Setting Pacing Pulse Width: 0.4 ms
Lead Channel Setting Sensing Sensitivity: 2 mV

## 2021-08-30 NOTE — Progress Notes (Signed)
Remote pacemaker transmission.   

## 2021-10-11 DIAGNOSIS — H43813 Vitreous degeneration, bilateral: Secondary | ICD-10-CM | POA: Diagnosis not present

## 2021-10-11 DIAGNOSIS — H35033 Hypertensive retinopathy, bilateral: Secondary | ICD-10-CM | POA: Diagnosis not present

## 2021-10-11 DIAGNOSIS — H353134 Nonexudative age-related macular degeneration, bilateral, advanced atrophic with subfoveal involvement: Secondary | ICD-10-CM | POA: Diagnosis not present

## 2021-11-16 ENCOUNTER — Encounter: Payer: Self-pay | Admitting: Emergency Medicine

## 2021-11-16 ENCOUNTER — Other Ambulatory Visit: Payer: Self-pay

## 2021-11-16 ENCOUNTER — Ambulatory Visit
Admission: EM | Admit: 2021-11-16 | Discharge: 2021-11-16 | Disposition: A | Discharge status: Home / Self Care | Payer: Medicare Other | Attending: Nurse Practitioner | Admitting: Nurse Practitioner

## 2021-11-16 DIAGNOSIS — Z20822 Contact with and (suspected) exposure to covid-19: Secondary | ICD-10-CM | POA: Diagnosis not present

## 2021-11-16 DIAGNOSIS — J069 Acute upper respiratory infection, unspecified: Secondary | ICD-10-CM

## 2021-11-16 MED ORDER — GUAIFENESIN ER 600 MG PO TB12: 600.0000 mg | ORAL_TABLET | Freq: Two times a day (BID) | ORAL | 0 refills | Status: AC | PRN

## 2021-11-16 MED ORDER — AMOXICILLIN-POT CLAVULANATE 875-125 MG PO TABS
1.0000 | ORAL_TABLET | Freq: Two times a day (BID) | ORAL | 0 refills | Status: AC
Start: 2021-11-19 — End: 2021-11-26

## 2021-11-16 MED ORDER — SALINE SPRAY 0.65 % NA SOLN: 1.0000 | NASAL | 0 refills | Status: AC | PRN

## 2021-11-16 NOTE — ED Provider Notes (Signed)
[facility]    CSN: [id number] Arrival date & time: 11/16/21  0932      History   Chief Complaint Chief Complaint  Patient presents with   Cough    HPI [name] is a 88 y.o. female.   Patient presents with husband today.  Patient is hard of hearing and obtaining history is slightly difficult.  Husband is also hard of hearing.  Patient reports "feeling bad" since Saturday, about 3 days ago.  She denies fevers, body aches, chills.  She has a dry cough, nasal congestion, and postnasal drainage with a sore throat.  She denies shortness of breath, wheezing, chest pain or tightness.  Also endorses headache and sinus pressure.  Denies ear pain or pressure, tooth pain, abdominal pain, nausea/vomiting, diarrhea, and decreased appetite.  She has been feeling "rundown."  Has used Afrin and albuterol inhaler without much relief.  Denies known sick contacts.  Medical history significant for paroxysmal A-fib, hypertensive emergency, coronary artery disease, acute on chronic CHF, respiratory failure with hypoxia, nonseasonal allergic rhinitis, hypothyroidism, chronic kidney disease, and status post cardiac pacemaker.    Past Medical History:  Diagnosis Date   Cardiomyopathy ([place])    CHF (congestive heart failure) (HCC)    Coronary artery disease    GERD (gastroesophageal reflux disease)    History of sick sinus syndrome    Hypertension    Hypothyroidism    Paroxysmal atrial fibrillation (HCC)    Shortness of breath dyspnea    Thyroid disease     Patient Active Problem List   Diagnosis Date Noted   Acute renal failure superimposed on stage 3 chronic kidney disease ([place]) 05/10/2021   Severe protein-calorie malnutrition ([place]) 05/10/2021   Paroxysmal A-fib ([place]) 05/04/2021   Acquired hypothyroidism 05/04/2021   Vitamin D deficiency 05/04/2021   Chronic non-seasonal allergic rhinitis 05/04/2021   CAD (coronary artery disease), native coronary artery 05/04/2021   Aortic  atherosclerosis ([place]) 05/04/2021   Anxiety 05/04/2021   Respiratory failure with hypoxia ([place]) 05/04/2021   Leukocytosis 05/04/2021   CKD (chronic kidney disease) stage 3, GFR 30-59 ml/min ([place]) 05/04/2021   Hypertensive urgency 05/01/2021   E. coli UTI 05/01/2021   Acute exacerbation of CHF (congestive heart failure) ([place]) 04/24/2021   Acute on chronic combined systolic and diastolic CHF (congestive heart failure) ([place]) 04/24/2021   Hypertensive emergency 04/24/2021   SOB (shortness of breath) 12/26/2014   Cardiac pacemaker in situ 09/30/2013   Benign essential HTN 09/30/2013   Plantar fasciitis of left foot 06/21/2011    Past Surgical History:  Procedure Laterality Date   APPENDECTOMY     CARDIAC CATHETERIZATION  07/25/2005   CATARACT EXTRACTION W/PHACO Right 08/21/2014   Procedure: CATARACT EXTRACTION PHACO AND INTRAOCULAR LENS PLACEMENT ([place]);  Surgeon: Tonny Branch, MD;  Location: [facility];  Service: Ophthalmology;  Laterality: Right;  CDE 10.94   CATARACT EXTRACTION W/PHACO Left 09/08/2014   Procedure: CATARACT EXTRACTION PHACO AND INTRAOCULAR LENS PLACEMENT ([facility]);  Surgeon: Tonny Branch, MD;  Location: [facility];  Service: Ophthalmology;  Laterality: Left;  CDE:7.79   CHOLECYSTECTOMY     KNEE ARTHROSCOPY Right    NM MYOVIEW LTD  11/06/2008   PACEMAKER PLACEMENT      OB History     Gravida      Para      Term      Preterm      AB      Living  0      SAB  IAB      Ectopic      Multiple      Live Births               Home Medications    Prior to Admission medications   Medication Sig Start Date End Date Taking? Authorizing Provider  amoxicillin-clavulanate (AUGMENTIN) 875-125 MG tablet Take 1 tablet by mouth 2 (two) times daily for 7 days. 11/19/21 11/26/21 Yes Eulogio Bear, NP  guaiFENesin (MUCINEX) 600 MG 12 hr tablet Take 1 tablet (600 mg total) by mouth 2 (two) times daily as needed for cough or to loosen phlegm. 11/16/21  Yes Noemi Chapel A,  NP  sodium chloride (OCEAN) 0.65 % SOLN nasal spray Place 1 spray into both nostrils as needed for congestion. 11/16/21  Yes Eulogio Bear, NP  acetaminophen (TYLENOL) 325 MG tablet Take 2 tablets (650 mg total) by mouth every 6 (six) hours as needed for mild pain (or Fever >/= 101). 04/28/21   Shahmehdi, Valeria Batman, MD  Amino Acids-Protein Hydrolys (FEEDING SUPPLEMENT, PRO-STAT SUGAR FREE 64,) LIQD Take 30 mLs by mouth 2 (two) times daily.    [provider]  apixaban (ELIQUIS) 2.5 MG TABS tablet Take 1 tablet (2.5 mg total) by mouth 2 (two) times daily. 05/18/21   Gerlene Fee, NP  carvedilol (COREG) 6.25 MG tablet Take 1 tablet (6.25 mg total) by mouth 2 (two) times daily with a meal. 05/18/21 07/15/21  Gerlene Fee, NP  digoxin (LANOXIN) 0.125 MG tablet Take one tablet on Monday - Friday and None on Saturday or Sunday 05/18/21   Gerlene Fee, NP  ergocalciferol (VITAMIN D2) 1.25 MG (50000 UT) capsule Take 1 capsule (50,000 Units total) by mouth once a week. 05/18/21   Gerlene Fee, NP  furosemide (LASIX) 20 MG tablet Take 1 tablet (20 mg total) by mouth 2 (two) times a week. 05/20/21   Gerlene Fee, NP  hydrALAZINE (APRESOLINE) 100 MG tablet Take 1 tablet (100 mg total) by mouth 2 (two) times daily. 05/18/21   Gerlene Fee, NP  isosorbide mononitrate (IMDUR) 30 MG 24 hr tablet Take 1 tablet (30 mg total) by mouth daily. 05/18/21 07/15/21  Gerlene Fee, NP  levocetirizine (XYZAL) 5 MG tablet Take 5 mg by mouth every morning.     [provider]  levothyroxine (SYNTHROID) 112 MCG tablet Take 1 tablet (112 mcg total) by mouth daily. 05/18/21   Gerlene Fee, NP  melatonin 1 MG TABS tablet Take 1 mg by mouth at bedtime.    [provider]  Multiple Vitamins-Minerals (PRESERVISION AREDS PO) Take 1 tablet by mouth 2 (two) times daily.    [provider]  Nutritional Supplements (ENSURE ENLIVE PO) 120 ml BID with medpass due to variable intake and  malnutrition risk. Twice A Day Between Meals    [provider]  Omega-3 Fatty Acids (FISH OIL) 1000 MG CAPS Take 300-1,000 mg by mouth daily.    [provider]  omeprazole (PRILOSEC) 20 MG capsule Take 20 mg by mouth daily.    [provider]  Polyethyl Glycol-Propyl Glycol (SYSTANE OP) Place 1 drop into both eyes 3 (three) times daily.     [provider]  Potassium 99 MG TABS Take 1 tablet by mouth 2 (two) times daily.     [provider]  VENTOLIN HFA 108 (90 Base) MCG/ACT inhaler Inhale 2 puffs into the lungs every 4 (four) hours as needed for  wheezing or shortness of breath. 05/18/21   Gerlene Fee, NP  vitamin C (ASCORBIC ACID) 500 MG tablet Take 500 mg by mouth every morning.     [provider]    Family History Family History  Problem Relation Age of Onset   Heart disease Other    Cancer Other    Kidney disease Other     Social History Social History   Tobacco Use   Smoking status: Former    Packs/day: 0.25    Years: 15.00    Total pack years: 3.75    Types: Cigarettes    Quit date: 08/13/1970    Years since quitting: 51.2   Smokeless tobacco: Never  Vaping Use   Vaping Use: Never used  Substance Use Topics   Alcohol use: No    Alcohol/week: 0.0 standard drinks of alcohol   Drug use: No     Allergies   Macrodantin and Sulfa antibiotics   Review of Systems Review of Systems Per HPI  Physical Exam Triage Vital Signs ED Triage Vitals  Enc Vitals Group     BP 11/16/21 0944 (!) 153/73     Pulse Rate 11/16/21 0944 77     Resp 11/16/21 0944 20     Temp 11/16/21 0944 98.5 F (36.9 C)     Temp Source 11/16/21 0944 Oral     SpO2 11/16/21 0944 94 %     Weight --      Height --      Head Circumference --      Peak Flow --      Pain Score 11/16/21 0945 2     Pain Loc --      Pain Edu? --      Excl. in [place]? --    No data found.  Updated Vital Signs BP (!) 153/73 (BP Location: Right Arm)   Pulse  77   Temp 98.5 F (36.9 C) (Oral)   Resp 20   SpO2 94%   Visual Acuity Right Eye Distance:   Left Eye Distance:   Bilateral Distance:    Right Eye Near:   Left Eye Near:    Bilateral Near:     Physical Exam Vitals and nursing note reviewed.  Constitutional:      General: She is not in acute distress.    Appearance: Normal appearance. She is not ill-appearing or toxic-appearing.  HENT:     Head: Normocephalic and atraumatic.     Right Ear: Tympanic membrane, ear canal and external ear normal. There is no impacted cerumen.     Left Ear: Tympanic membrane, ear canal and external ear normal. There is no impacted cerumen.     Nose: Congestion present. No rhinorrhea.     Mouth/Throat:     Mouth: Mucous membranes are moist.     Pharynx: Oropharynx is clear. Posterior oropharyngeal erythema present. No oropharyngeal exudate.  Eyes:     General: No scleral icterus.    Extraocular Movements: Extraocular movements intact.  Cardiovascular:     Rate and Rhythm: Normal rate and regular rhythm.  Pulmonary:     Effort: Pulmonary effort is normal. No respiratory distress.     Breath sounds: Normal breath sounds. No wheezing, rhonchi or rales.  Abdominal:     Palpations: Abdomen is soft.  Musculoskeletal:     Cervical back: Normal range of motion and neck supple.  Lymphadenopathy:     Cervical: No cervical adenopathy.  Skin:    General: Skin is  warm and dry.     Capillary Refill: Capillary refill takes less than 2 seconds.     Coloration: Skin is not jaundiced or pale.     Findings: No erythema or rash.  Neurological:     Mental Status: She is alert and oriented to person, place, and time.  Psychiatric:        Behavior: Behavior is cooperative.    UC Treatments / Results  Labs (all labs ordered are listed, but only abnormal results are displayed) Labs Reviewed  SARS CORONAVIRUS 2 (TAT 6-24 HRS)    EKG   Radiology No results found.  Procedures Procedures (including  critical care time)  Medications Ordered in UC Medications - No data to display  Initial Impression / Assessment and Plan / UC Course  I have reviewed the triage vital signs and the nursing notes.  Pertinent labs & imaging results that were available during my care of the patient were reviewed by me and considered in my medical decision making (see chart for details).    Patient is a well-appearing 88 year old female presenting for headache, sinus congestion, and nasal congestion for the past 3 days.  In triage, she is oxygenating well on room air, is slightly hypertensive probably secondary to pseudoephedrine use, afebrile, and not tachycardic.  She is also not tachypneic.  Symptoms are likely viral in etiology and this was discussed with the patient and her husband.  COVID-19 testing obtained.  Patient would be a candidate for molnupiravir if she test positive.  Supportive care discussed.  Recommended Mucinex, increased hydration, nasal saline rinses.  Patient adamant that she desires antibiotic to help her "feel better sooner".  I explained to the patient that an antibiotic would not likely make her feel better any sooner than letting the symptoms run their course.  However, we did come to the agreement that if her symptoms persist without improvement towards the end of the week, she can pick up the antibiotic from the pharmacy.  The patient was given the opportunity to ask questions.  All questions answered to their satisfaction.  The patient is in agreement to this plan.   Final Clinical Impressions(s) / UC Diagnoses   Final diagnoses:  Viral URI with cough     Discharge Instructions      Your symptoms and exam findings are most consistent with a viral upper respiratory infection. These usually run their course in about 10 days.  If your symptoms last longer than 10 days without improvement, please follow up with your primary care provider.  If your symptoms, worsen, please go to the  Emergency Room.    We have tested you today for COVID-19.  You will see the results in Mychart and we will call you with positive results.   If the COVID-19 test is positive, we will call you and would recommend starting on antiviral therapy.  Please stay home and isolate until you are aware of the results.    Some things that can make you feel better are: - Increased rest - Increasing fluid with water/sugar free electrolytes - Acetaminophen and ibuprofen as needed for fever/pain.  - Salt water gargling, chloraseptic spray and throat lozenges - OTC guaifenesin (Mucinex) 600 mg twice daily.  - Saline sinus flushes or a neti pot.  - Humidifying the air.  If your symptoms are not improved by the end of this week, you can start on the Augmentin (antibiotic).      ED Prescriptions     Medication  Sig Dispense Auth. Provider   amoxicillin-clavulanate (AUGMENTIN) 875-125 MG tablet Take 1 tablet by mouth 2 (two) times daily for 7 days. 14 tablet Noemi Chapel A, NP   guaiFENesin (MUCINEX) 600 MG 12 hr tablet Take 1 tablet (600 mg total) by mouth 2 (two) times daily as needed for cough or to loosen phlegm. 30 tablet Noemi Chapel A, NP   sodium chloride (OCEAN) 0.65 % SOLN nasal spray Place 1 spray into both nostrils as needed for congestion. 88 mL Eulogio Bear, NP      PDMP not reviewed this encounter.   Eulogio Bear, NP 11/16/21 1313

## 2021-11-16 NOTE — Discharge Instructions (Addendum)
Your symptoms and exam findings are most consistent with a viral upper respiratory infection. These usually run their course in about 10 days.  If your symptoms last longer than 10 days without improvement, please follow up with your primary care provider.  If your symptoms, worsen, please go to the Emergency Room.    We have tested you today for COVID-19.  You will see the results in Mychart and we will call you with positive results.   If the COVID-19 test is positive, we will call you and would recommend starting on antiviral therapy.  Please stay home and isolate until you are aware of the results.    Some things that can make you feel better are: - Increased rest - Increasing fluid with water/sugar free electrolytes - Acetaminophen and ibuprofen as needed for fever/pain.  - Salt water gargling, chloraseptic spray and throat lozenges - OTC guaifenesin (Mucinex) 600 mg twice daily.  - Saline sinus flushes or a neti pot.  - Humidifying the air.  If your symptoms are not improved by the end of this week, you can start on the Augmentin (antibiotic).

## 2021-11-16 NOTE — ED Triage Notes (Signed)
Pt reports cough, intermittent headache for last several days.

## 2021-11-17 LAB — SARS CORONAVIRUS 2 (TAT 6-24 HRS): SARS Coronavirus 2: NEGATIVE

## 2021-11-19 ENCOUNTER — Ambulatory Visit (INDEPENDENT_AMBULATORY_CARE_PROVIDER_SITE_OTHER): Payer: Medicare Other

## 2021-11-19 DIAGNOSIS — I480 Paroxysmal atrial fibrillation: Secondary | ICD-10-CM | POA: Diagnosis not present

## 2021-11-19 LAB — CUP PACEART REMOTE DEVICE CHECK
Battery Impedance: 3947 Ohm
Battery Remaining Longevity: 16 mo
Battery Voltage: 2.7 V
Brady Statistic AP VP Percent: 74 %
Brady Statistic AP VS Percent: 0 %
Brady Statistic AS VP Percent: 26 %
Brady Statistic AS VS Percent: 0 %
Date Time Interrogation Session: 20230825111543
Implantable Lead Implant Date: 20110818
Implantable Lead Implant Date: 20110818
Implantable Lead Location: 753859
Implantable Lead Location: 753860
Implantable Lead Model: 5092
Implantable Lead Model: 5594
Implantable Pulse Generator Implant Date: 20110818
Lead Channel Impedance Value: 561 Ohm
Lead Channel Impedance Value: 654 Ohm
Lead Channel Pacing Threshold Amplitude: 0.5 V
Lead Channel Pacing Threshold Amplitude: 1 V
Lead Channel Pacing Threshold Pulse Width: 0.4 ms
Lead Channel Pacing Threshold Pulse Width: 0.4 ms
Lead Channel Setting Pacing Amplitude: 1.5 V
Lead Channel Setting Pacing Amplitude: 2 V
Lead Channel Setting Pacing Pulse Width: 0.4 ms
Lead Channel Setting Sensing Sensitivity: 2 mV

## 2021-12-01 DIAGNOSIS — Z681 Body mass index (BMI) 19 or less, adult: Secondary | ICD-10-CM | POA: Diagnosis not present

## 2021-12-01 DIAGNOSIS — J069 Acute upper respiratory infection, unspecified: Secondary | ICD-10-CM | POA: Diagnosis not present

## 2021-12-01 DIAGNOSIS — I5042 Chronic combined systolic (congestive) and diastolic (congestive) heart failure: Secondary | ICD-10-CM | POA: Diagnosis not present

## 2021-12-01 DIAGNOSIS — J449 Chronic obstructive pulmonary disease, unspecified: Secondary | ICD-10-CM | POA: Diagnosis not present

## 2021-12-14 NOTE — Progress Notes (Signed)
Remote pacemaker transmission.   

## 2021-12-15 DIAGNOSIS — L820 Inflamed seborrheic keratosis: Secondary | ICD-10-CM | POA: Diagnosis not present

## 2022-02-10 ENCOUNTER — Telehealth: Payer: Self-pay

## 2022-02-10 NOTE — Telephone Encounter (Signed)
Patient called direct line to triage to inquire about her pacer download from August 2023. Informed her of the report from Dr. Lovena Le: "Remote device check reviewed. Histograms appropriate. Leads and battery stable for patient. Follow up as outlined above. No recommended changes."  Patient informed her next download would be around 02/20/22. She stated she has not received any information regarding the next download. She wanted to be contacted.

## 2022-02-10 NOTE — Telephone Encounter (Signed)
Attempted to return call. No answer, LMTCB.

## 2022-02-11 DIAGNOSIS — J209 Acute bronchitis, unspecified: Secondary | ICD-10-CM | POA: Diagnosis not present

## 2022-02-11 DIAGNOSIS — N1832 Chronic kidney disease, stage 3b: Secondary | ICD-10-CM | POA: Diagnosis not present

## 2022-02-11 DIAGNOSIS — J0100 Acute maxillary sinusitis, unspecified: Secondary | ICD-10-CM | POA: Diagnosis not present

## 2022-02-11 DIAGNOSIS — I130 Hypertensive heart and chronic kidney disease with heart failure and stage 1 through stage 4 chronic kidney disease, or unspecified chronic kidney disease: Secondary | ICD-10-CM | POA: Diagnosis not present

## 2022-02-11 NOTE — Telephone Encounter (Signed)
I spoke with the patient and let her know that the transmission is normal device function. No new episodes. Everything looks good.

## 2022-02-22 ENCOUNTER — Ambulatory Visit (INDEPENDENT_AMBULATORY_CARE_PROVIDER_SITE_OTHER): Payer: Medicare Other

## 2022-02-22 DIAGNOSIS — I480 Paroxysmal atrial fibrillation: Secondary | ICD-10-CM

## 2022-02-22 DIAGNOSIS — J069 Acute upper respiratory infection, unspecified: Secondary | ICD-10-CM | POA: Diagnosis not present

## 2022-02-22 DIAGNOSIS — J9801 Acute bronchospasm: Secondary | ICD-10-CM | POA: Diagnosis not present

## 2022-02-22 DIAGNOSIS — J209 Acute bronchitis, unspecified: Secondary | ICD-10-CM | POA: Diagnosis not present

## 2022-02-22 DIAGNOSIS — Z681 Body mass index (BMI) 19 or less, adult: Secondary | ICD-10-CM | POA: Diagnosis not present

## 2022-02-23 LAB — CUP PACEART REMOTE DEVICE CHECK
Battery Impedance: 4598 Ohm
Battery Remaining Longevity: 12 mo
Battery Voltage: 2.68 V
Brady Statistic AP VP Percent: 73 %
Brady Statistic AP VS Percent: 0 %
Brady Statistic AS VP Percent: 27 %
Brady Statistic AS VS Percent: 0 %
Date Time Interrogation Session: 20231128133939
Implantable Lead Connection Status: 753985
Implantable Lead Connection Status: 753985
Implantable Lead Implant Date: 20110818
Implantable Lead Implant Date: 20110818
Implantable Lead Location: 753859
Implantable Lead Location: 753860
Implantable Lead Model: 5092
Implantable Lead Model: 5594
Implantable Pulse Generator Implant Date: 20110818
Lead Channel Impedance Value: 571 Ohm
Lead Channel Impedance Value: 645 Ohm
Lead Channel Pacing Threshold Amplitude: 0.625 V
Lead Channel Pacing Threshold Amplitude: 1 V
Lead Channel Pacing Threshold Pulse Width: 0.4 ms
Lead Channel Pacing Threshold Pulse Width: 0.4 ms
Lead Channel Setting Pacing Amplitude: 1.5 V
Lead Channel Setting Pacing Amplitude: 2 V
Lead Channel Setting Pacing Pulse Width: 0.4 ms
Lead Channel Setting Sensing Sensitivity: 2 mV
Zone Setting Status: 755011
Zone Setting Status: 755011

## 2022-03-25 NOTE — Progress Notes (Signed)
Remote pacemaker transmission.   

## 2022-03-26 ENCOUNTER — Encounter (HOSPITAL_COMMUNITY): Payer: Self-pay

## 2022-03-26 ENCOUNTER — Inpatient Hospital Stay (HOSPITAL_COMMUNITY)
Admission: EM | Admit: 2022-03-26 | Discharge: 2022-04-28 | DRG: 291 | Disposition: E | Payer: Medicare Other | Attending: Internal Medicine | Admitting: Internal Medicine

## 2022-03-26 ENCOUNTER — Other Ambulatory Visit: Payer: Self-pay

## 2022-03-26 ENCOUNTER — Emergency Department (HOSPITAL_COMMUNITY): Payer: Medicare Other

## 2022-03-26 DIAGNOSIS — I480 Paroxysmal atrial fibrillation: Secondary | ICD-10-CM | POA: Diagnosis present

## 2022-03-26 DIAGNOSIS — Z66 Do not resuscitate: Secondary | ICD-10-CM | POA: Diagnosis present

## 2022-03-26 DIAGNOSIS — F05 Delirium due to known physiological condition: Secondary | ICD-10-CM | POA: Diagnosis not present

## 2022-03-26 DIAGNOSIS — I2489 Other forms of acute ischemic heart disease: Secondary | ICD-10-CM | POA: Diagnosis present

## 2022-03-26 DIAGNOSIS — R0602 Shortness of breath: Secondary | ICD-10-CM | POA: Diagnosis not present

## 2022-03-26 DIAGNOSIS — Z87891 Personal history of nicotine dependence: Secondary | ICD-10-CM

## 2022-03-26 DIAGNOSIS — Z841 Family history of disorders of kidney and ureter: Secondary | ICD-10-CM

## 2022-03-26 DIAGNOSIS — I10 Essential (primary) hypertension: Secondary | ICD-10-CM | POA: Diagnosis not present

## 2022-03-26 DIAGNOSIS — J449 Chronic obstructive pulmonary disease, unspecified: Secondary | ICD-10-CM | POA: Diagnosis present

## 2022-03-26 DIAGNOSIS — I2510 Atherosclerotic heart disease of native coronary artery without angina pectoris: Secondary | ICD-10-CM | POA: Diagnosis present

## 2022-03-26 DIAGNOSIS — K219 Gastro-esophageal reflux disease without esophagitis: Secondary | ICD-10-CM | POA: Diagnosis present

## 2022-03-26 DIAGNOSIS — Z881 Allergy status to other antibiotic agents status: Secondary | ICD-10-CM

## 2022-03-26 DIAGNOSIS — N179 Acute kidney failure, unspecified: Secondary | ICD-10-CM | POA: Diagnosis not present

## 2022-03-26 DIAGNOSIS — I5043 Acute on chronic combined systolic (congestive) and diastolic (congestive) heart failure: Secondary | ICD-10-CM | POA: Diagnosis not present

## 2022-03-26 DIAGNOSIS — R64 Cachexia: Secondary | ICD-10-CM | POA: Diagnosis present

## 2022-03-26 DIAGNOSIS — Z515 Encounter for palliative care: Secondary | ICD-10-CM

## 2022-03-26 DIAGNOSIS — N1832 Chronic kidney disease, stage 3b: Secondary | ICD-10-CM | POA: Diagnosis present

## 2022-03-26 DIAGNOSIS — E860 Dehydration: Secondary | ICD-10-CM | POA: Diagnosis present

## 2022-03-26 DIAGNOSIS — G9341 Metabolic encephalopathy: Secondary | ICD-10-CM | POA: Diagnosis not present

## 2022-03-26 DIAGNOSIS — I130 Hypertensive heart and chronic kidney disease with heart failure and stage 1 through stage 4 chronic kidney disease, or unspecified chronic kidney disease: Secondary | ICD-10-CM | POA: Diagnosis not present

## 2022-03-26 DIAGNOSIS — E876 Hypokalemia: Secondary | ICD-10-CM | POA: Insufficient documentation

## 2022-03-26 DIAGNOSIS — E46 Unspecified protein-calorie malnutrition: Secondary | ICD-10-CM | POA: Diagnosis present

## 2022-03-26 DIAGNOSIS — Z9981 Dependence on supplemental oxygen: Secondary | ICD-10-CM

## 2022-03-26 DIAGNOSIS — B9689 Other specified bacterial agents as the cause of diseases classified elsewhere: Secondary | ICD-10-CM | POA: Diagnosis not present

## 2022-03-26 DIAGNOSIS — N183 Chronic kidney disease, stage 3 unspecified: Secondary | ICD-10-CM | POA: Diagnosis present

## 2022-03-26 DIAGNOSIS — J9622 Acute and chronic respiratory failure with hypercapnia: Secondary | ICD-10-CM | POA: Diagnosis not present

## 2022-03-26 DIAGNOSIS — I429 Cardiomyopathy, unspecified: Secondary | ICD-10-CM | POA: Diagnosis present

## 2022-03-26 DIAGNOSIS — Z79899 Other long term (current) drug therapy: Secondary | ICD-10-CM

## 2022-03-26 DIAGNOSIS — E039 Hypothyroidism, unspecified: Secondary | ICD-10-CM | POA: Diagnosis present

## 2022-03-26 DIAGNOSIS — Z79890 Hormone replacement therapy (postmenopausal): Secondary | ICD-10-CM

## 2022-03-26 DIAGNOSIS — Z1152 Encounter for screening for COVID-19: Secondary | ICD-10-CM

## 2022-03-26 DIAGNOSIS — J9621 Acute and chronic respiratory failure with hypoxia: Secondary | ICD-10-CM

## 2022-03-26 DIAGNOSIS — F419 Anxiety disorder, unspecified: Secondary | ICD-10-CM | POA: Diagnosis present

## 2022-03-26 DIAGNOSIS — I495 Sick sinus syndrome: Secondary | ICD-10-CM | POA: Diagnosis present

## 2022-03-26 DIAGNOSIS — Z7901 Long term (current) use of anticoagulants: Secondary | ICD-10-CM

## 2022-03-26 DIAGNOSIS — Z681 Body mass index (BMI) 19 or less, adult: Secondary | ICD-10-CM

## 2022-03-26 DIAGNOSIS — Z9049 Acquired absence of other specified parts of digestive tract: Secondary | ICD-10-CM

## 2022-03-26 DIAGNOSIS — Z8249 Family history of ischemic heart disease and other diseases of the circulatory system: Secondary | ICD-10-CM

## 2022-03-26 DIAGNOSIS — N390 Urinary tract infection, site not specified: Secondary | ICD-10-CM | POA: Diagnosis not present

## 2022-03-26 DIAGNOSIS — Z950 Presence of cardiac pacemaker: Secondary | ICD-10-CM

## 2022-03-26 DIAGNOSIS — E870 Hyperosmolality and hypernatremia: Secondary | ICD-10-CM | POA: Diagnosis not present

## 2022-03-26 DIAGNOSIS — Z882 Allergy status to sulfonamides status: Secondary | ICD-10-CM

## 2022-03-26 LAB — COMPREHENSIVE METABOLIC PANEL
ALT: 36 U/L (ref 0–44)
AST: 36 U/L (ref 15–41)
Albumin: 2.9 g/dL — ABNORMAL LOW (ref 3.5–5.0)
Alkaline Phosphatase: 202 U/L — ABNORMAL HIGH (ref 38–126)
Anion gap: 9 (ref 5–15)
BUN: 26 mg/dL — ABNORMAL HIGH (ref 8–23)
CO2: 32 mmol/L (ref 22–32)
Calcium: 9 mg/dL (ref 8.9–10.3)
Chloride: 95 mmol/L — ABNORMAL LOW (ref 98–111)
Creatinine, Ser: 1.11 mg/dL — ABNORMAL HIGH (ref 0.44–1.00)
GFR, Estimated: 46 mL/min — ABNORMAL LOW (ref >60)
Glucose, Bld: 117 mg/dL — ABNORMAL HIGH (ref 70–99)
Potassium: 3.4 mmol/L — ABNORMAL LOW (ref 3.5–5.1)
Sodium: 136 mmol/L (ref 135–145)
Total Bilirubin: 0.9 mg/dL (ref 0.3–1.2)
Total Protein: 6.1 g/dL — ABNORMAL LOW (ref 6.5–8.1)

## 2022-03-26 LAB — CBC
HCT: 33.9 % — ABNORMAL LOW (ref 36.0–46.0)
Hemoglobin: 10.8 g/dL — ABNORMAL LOW (ref 12.0–15.0)
MCH: 31 pg (ref 26.0–34.0)
MCHC: 31.9 g/dL (ref 30.0–36.0)
MCV: 97.4 fL (ref 80.0–100.0)
Platelets: 246 10*3/uL (ref 150–400)
RBC: 3.48 MIL/uL — ABNORMAL LOW (ref 3.87–5.11)
RDW: 13.8 % (ref 11.5–15.5)
WBC: 8.5 10*3/uL (ref 4.0–10.5)
nRBC: 0 % (ref 0.0–0.2)

## 2022-03-26 LAB — RESP PANEL BY RT-PCR (RSV, FLU A&B, COVID)  RVPGX2
Influenza A by PCR: NEGATIVE
Influenza B by PCR: NEGATIVE
Resp Syncytial Virus by PCR: NEGATIVE
SARS Coronavirus 2 by RT PCR: NEGATIVE

## 2022-03-26 LAB — TROPONIN I (HIGH SENSITIVITY)
Troponin I (High Sensitivity): 348 ng/L (ref <18)
Troponin I (High Sensitivity): 323 ng/L (ref <18)

## 2022-03-26 LAB — BRAIN NATRIURETIC PEPTIDE: B Natriuretic Peptide: 1060 pg/mL — ABNORMAL HIGH (ref 0.0–100.0)

## 2022-03-26 MED ORDER — FUROSEMIDE 10 MG/ML IJ SOLN
40.0000 mg | Freq: Every day | INTRAMUSCULAR | Status: DC
  Administered 2022-03-27 (×2): 40 mg via INTRAVENOUS
  Filled 2022-03-26 (×2): qty 4

## 2022-03-26 MED ORDER — CARVEDILOL 3.125 MG PO TABS
6.2500 mg | ORAL_TABLET | Freq: Two times a day (BID) | ORAL | Status: DC
  Administered 2022-03-27 – 2022-03-29 (×6): 6.25 mg via ORAL
  Filled 2022-03-26 (×8): qty 2

## 2022-03-26 MED ORDER — POTASSIUM CHLORIDE CRYS ER 20 MEQ PO TBCR
40.0000 meq | EXTENDED_RELEASE_TABLET | Freq: Once | ORAL | Status: AC
  Administered 2022-03-27: 40 meq via ORAL
  Filled 2022-03-26: qty 2

## 2022-03-26 MED ORDER — HYDRALAZINE HCL 25 MG PO TABS
100.0000 mg | ORAL_TABLET | Freq: Two times a day (BID) | ORAL | Status: DC
  Administered 2022-03-27 – 2022-03-29 (×6): 100 mg via ORAL
  Filled 2022-03-26 (×10): qty 4

## 2022-03-26 MED ORDER — IPRATROPIUM BROMIDE 0.06 % NA SOLN
1.0000 | Freq: Two times a day (BID) | NASAL | Status: DC
  Administered 2022-03-27 – 2022-04-02 (×9): 1 via NASAL
  Filled 2022-03-26: qty 15

## 2022-03-26 MED ORDER — LORATADINE 10 MG PO TABS
10.0000 mg | ORAL_TABLET | Freq: Every morning | ORAL | Status: DC
  Administered 2022-03-27 – 2022-03-30 (×4): 10 mg via ORAL
  Filled 2022-03-26 (×5): qty 1

## 2022-03-26 MED ORDER — SODIUM CHLORIDE 0.9 % IV SOLN: 250.0000 mL | INTRAVENOUS | Status: DC | PRN

## 2022-03-26 MED ORDER — ENSURE ENLIVE PO LIQD
120.0000 mL | Freq: Two times a day (BID) | ORAL | Status: DC
  Administered 2022-03-27 – 2022-03-29 (×4): 120 mL via ORAL
  Filled 2022-03-26 (×2): qty 237

## 2022-03-26 MED ORDER — LEVOTHYROXINE SODIUM 112 MCG PO TABS
112.0000 ug | ORAL_TABLET | Freq: Every day | ORAL | Status: DC
  Administered 2022-03-27 – 2022-03-30 (×4): 112 ug via ORAL
  Filled 2022-03-26 (×4): qty 1

## 2022-03-26 MED ORDER — ACETAMINOPHEN 325 MG PO TABS
650.0000 mg | ORAL_TABLET | ORAL | Status: DC | PRN
  Administered 2022-03-28: 650 mg via ORAL
  Filled 2022-03-26: qty 2

## 2022-03-26 MED ORDER — POLYVINYL ALCOHOL 1.4 % OP SOLN
1.0000 [drp] | Freq: Three times a day (TID) | OPHTHALMIC | Status: DC
  Administered 2022-03-27 – 2022-04-03 (×21): 1 [drp] via OPHTHALMIC
  Filled 2022-03-26 (×2): qty 15

## 2022-03-26 MED ORDER — SODIUM CHLORIDE 0.9% FLUSH
3.0000 mL | Freq: Two times a day (BID) | INTRAVENOUS | Status: DC
  Administered 2022-03-27 – 2022-04-04 (×17): 3 mL via INTRAVENOUS

## 2022-03-26 MED ORDER — APIXABAN 2.5 MG PO TABS
2.5000 mg | ORAL_TABLET | Freq: Two times a day (BID) | ORAL | Status: DC
  Administered 2022-03-27 – 2022-03-30 (×8): 2.5 mg via ORAL
  Filled 2022-03-26 (×10): qty 1

## 2022-03-26 MED ORDER — MELATONIN 3 MG PO TABS
3.0000 mg | ORAL_TABLET | Freq: Every day | ORAL | Status: DC
  Administered 2022-03-27 – 2022-03-29 (×4): 3 mg via ORAL
  Filled 2022-03-26 (×5): qty 1

## 2022-03-26 MED ORDER — ISOSORBIDE MONONITRATE ER 30 MG PO TB24
30.0000 mg | ORAL_TABLET | Freq: Every day | ORAL | Status: DC
  Administered 2022-03-27 – 2022-03-30 (×4): 30 mg via ORAL
  Filled 2022-03-26 (×5): qty 1

## 2022-03-26 MED ORDER — ONDANSETRON HCL 4 MG/2ML IJ SOLN: 4.0000 mg | Freq: Four times a day (QID) | INTRAMUSCULAR | Status: DC | PRN

## 2022-03-26 MED ORDER — SODIUM CHLORIDE 0.9% FLUSH: 3.0000 mL | INTRAVENOUS | Status: DC | PRN

## 2022-03-26 MED ORDER — ALBUTEROL SULFATE (2.5 MG/3ML) 0.083% IN NEBU
2.5000 mg | INHALATION_SOLUTION | RESPIRATORY_TRACT | Status: DC | PRN
  Administered 2022-03-27 – 2022-04-03 (×4): 2.5 mg via RESPIRATORY_TRACT
  Filled 2022-03-26 (×4): qty 3

## 2022-03-26 MED ORDER — FUROSEMIDE 10 MG/ML IJ SOLN
40.0000 mg | Freq: Once | INTRAMUSCULAR | Status: AC
  Administered 2022-03-26: 40 mg via INTRAVENOUS
  Filled 2022-03-26: qty 4

## 2022-03-26 NOTE — ED Triage Notes (Signed)
Patient presents to ED via [facility], patient reports c/o shortness of  breath,  non -productive cough and weakness for 5 days. Patient has a history of COPD and CHF. Reports she wears oxygen at home for prn use. Since last week she has been currently using her oxygen continuously. Denis any chest pain.

## 2022-03-26 NOTE — Plan of Care (Signed)
?  Problem: Activity: ?Goal: Capacity to carry out activities will improve ?Outcome: Progressing ?  ?

## 2022-03-26 NOTE — H&P (Signed)
History and Physical    Patient: [name] MGN:[id number] DOB: [birth year] DOA: 03/05/2022 DOS: the patient was seen and examined on 03/07/2022 PCP: Sharilyn Sites, MD  Patient coming from: Home  Chief Complaint:  Chief Complaint  Patient presents with   Shortness of Breath   HPI: [name] is a 88 y.o. female with medical history significant of HFrEF, PAF on eliquis, SSS s/p PPM.  Pt on PRN O2 at home.  Pt with SOB, productive cough, generalized weakness over past couple of days.  Worsening.  She states she has been the doctor several times since beginning November for similar symptoms and has received several rounds of antibiotics as well as a round of prednisone.  She states she got better but then got worse again over the last few days.  She has not had any fevers or chills, no known sick contacts.  Patient has a history of COPD and CHF. Reports she wears 2 L oxygen at home for prn use. Since last week she has been currently using her oxygen continuously. Denies any chest pain, hemoptysis, N/V/D/C, urinary symptoms.  She endorses periodic symmetric lower extremity swelling that is not worse today, and improves with her Lasix.  Review of Systems: As mentioned in the history of present illness. All other systems reviewed and are negative. Past Medical History:  Diagnosis Date   Cardiomyopathy [facility])    CHF (congestive heart failure) (HCC)    Coronary artery disease    GERD (gastroesophageal reflux disease)    History of sick sinus syndrome    Hypertension    Hypothyroidism    Paroxysmal atrial fibrillation (HCC)    Shortness of breath dyspnea    Thyroid disease    Past Surgical History:  Procedure Laterality Date   APPENDECTOMY     CARDIAC CATHETERIZATION  07/25/2005   CATARACT EXTRACTION W/PHACO Right 08/21/2014   Procedure: CATARACT EXTRACTION PHACO AND INTRAOCULAR LENS PLACEMENT ([place]);  Surgeon: Tonny Branch, MD;  Location: [facility];  Service: Ophthalmology;  Laterality:  Right;  CDE 10.94   CATARACT EXTRACTION W/PHACO Left 09/08/2014   Procedure: CATARACT EXTRACTION PHACO AND INTRAOCULAR LENS PLACEMENT ([facility]);  Surgeon: Tonny Branch, MD;  Location: [facility];  Service: Ophthalmology;  Laterality: Left;  CDE:7.79   CHOLECYSTECTOMY     KNEE ARTHROSCOPY Right    NM MYOVIEW LTD  11/06/2008   PACEMAKER PLACEMENT     Social History:  reports that she quit smoking about 51 years ago. Her smoking use included cigarettes. She has a 3.75 pack-year smoking history. She has never used smokeless tobacco. She reports that she does not drink alcohol and does not use drugs.  Allergies  Allergen Reactions   Macrodantin Shortness Of Breath   Sulfa Antibiotics Shortness Of Breath    Family History  Problem Relation Age of Onset   Heart disease Other    Cancer Other    Kidney disease Other     Prior to Admission medications   Medication Sig Start Date End Date Taking? Authorizing Provider  acetaminophen (TYLENOL) 325 MG tablet Take 2 tablets (650 mg total) by mouth every 6 (six) hours as needed for mild pain (or Fever >/= 101). 04/28/21  Yes Shahmehdi, Valeria Batman, MD  apixaban (ELIQUIS) 2.5 MG TABS tablet Take 1 tablet (2.5 mg total) by mouth 2 (two) times daily. 05/18/21  Yes Gerlene Fee, NP  carvedilol (COREG) 6.25 MG tablet Take 1 tablet (6.25 mg total) by mouth 2 (two) times daily with a  meal. 05/18/21 03/05/2022 Yes Gerlene Fee, NP  diclofenac Sodium (VOLTAREN) 1 % GEL Apply 2 g topically 4 (four) times daily.   Yes [provider]  ergocalciferol (VITAMIN D2) 1.25 MG (50000 UT) capsule Take 1 capsule (50,000 Units total) by mouth once a week. 05/18/21  Yes Gerlene Fee, NP  furosemide (LASIX) 20 MG tablet Take 1 tablet (20 mg total) by mouth 2 (two) times a week. 05/20/21  Yes Gerlene Fee, NP  guaiFENesin (MUCINEX) 600 MG 12 hr tablet Take 1 tablet (600 mg total) by mouth 2 (two) times daily as needed for cough or to loosen phlegm. 11/16/21  Yes Noemi Chapel A, NP  hydrALAZINE (APRESOLINE) 100 MG tablet Take 1 tablet (100 mg total) by mouth 2 (two) times daily. 05/18/21  Yes Gerlene Fee, NP  ipratropium (ATROVENT) 0.03 % nasal spray Place 1 spray into both nostrils 2 (two) times daily.   Yes [provider]  isosorbide mononitrate (IMDUR) 30 MG 24 hr tablet Take 1 tablet (30 mg total) by mouth daily. 05/18/21 03/04/2022 Yes Gerlene Fee, NP  levocetirizine (XYZAL) 5 MG tablet Take 5 mg by mouth every morning.    Yes [provider]  levothyroxine (SYNTHROID) 112 MCG tablet Take 1 tablet (112 mcg total) by mouth daily. 05/18/21  Yes Gerlene Fee, NP  Multiple Vitamins-Minerals (PRESERVISION AREDS PO) Take 1 tablet by mouth 2 (two) times daily.   Yes [provider]  Nutritional Supplements (ENSURE ENLIVE PO) 120 ml BID with medpass due to variable intake and malnutrition risk. Twice A Day Between Meals   Yes [provider]  Omega-3 Fatty Acids (FISH OIL) 1000 MG CAPS Take 300-1,000 mg by mouth daily.   Yes [provider]  Polyethyl Glycol-Propyl Glycol (SYSTANE OP) Place 1 drop into both eyes 3 (three) times daily.    Yes [provider]  Potassium 99 MG TABS Take 1 tablet by mouth 2 (two) times daily.    Yes [provider]  sodium chloride (OCEAN) 0.65 % SOLN nasal spray Place 1 spray into both nostrils as needed for congestion. 11/16/21  Yes Eulogio Bear, NP  VENTOLIN HFA 108 (90 Base) MCG/ACT inhaler Inhale 2 puffs into the lungs every 4 (four) hours as needed for wheezing or shortness of breath. 05/18/21  Yes Gerlene Fee, NP  vitamin C (ASCORBIC ACID) 500 MG tablet Take 500 mg by mouth every morning.    Yes [provider]    Physical Exam: Vitals:   03/14/2022 1930 03/15/2022 2000 03/03/2022 2056 03/27/2022 2130  BP: (!) 158/58 (!) 154/55  (!) 145/44  Pulse: 82 69  82  Resp: '17 15  18  '$ Temp:    97.9 F (36.6 C)  TempSrc:    Oral  SpO2: 97% 97% 96%  96%  Weight:      Height:       Constitutional: NAD, calm, comfortable Eyes: PERRL, lids and conjunctivae normal ENMT: Mucous membranes are moist. Posterior pharynx clear of any exudate or lesions.Normal dentition.  Neck: normal, supple, no masses, no thyromegaly Respiratory: B crackles Cardiovascular: IRR, IRR, no peripheral edema. Abdomen: no tenderness, no masses palpated. No hepatosplenomegaly. Bowel sounds positive.  Musculoskeletal: no clubbing / cyanosis. No joint deformity upper and lower extremities. Good ROM, no contractures. Normal muscle tone.  Skin: no rashes, lesions, ulcers. No induration Neurologic: CN 2-12 grossly intact. Sensation intact, DTR normal. Strength 5/5 in all 4.  Psychiatric: Normal judgment  and insight. Alert and oriented x 3. Normal mood.   Data Reviewed:    BNP 1060 (was 217 in Feb) Trops 323 and 348 Covid, flu, and RSV neg  CXR = mild Pulm edema     Latest Ref Rng & Units 03/09/2022    3:55 PM 05/06/2021    8:00 AM 04/28/2021    5:20 AM  CBC  WBC 4.0 - 10.5 K/uL 8.5  7.7  19.1   Hemoglobin 12.0 - 15.0 g/dL 10.8  11.2  12.1   Hematocrit 36.0 - 46.0 % 33.9  34.8  38.9   Platelets 150 - 400 K/uL 246  241  197       Latest Ref Rng & Units 03/15/2022    3:55 PM 05/10/2021    8:52 AM 05/06/2021    8:00 AM  CMP  Glucose 70 - 99 mg/dL 117  99  98   BUN 8 - 23 mg/dL 26  68  59   Creatinine 0.44 - 1.00 mg/dL 1.11  1.50  1.63   Sodium 135 - 145 mmol/L 136  136  135   Potassium 3.5 - 5.1 mmol/L 3.4  4.3  4.2   Chloride 98 - 111 mmol/L 95  95  91   CO2 22 - 32 mmol/L 32  34  38   Calcium 8.9 - 10.3 mg/dL 9.0  8.9  8.8   Total Protein 6.5 - 8.1 g/dL 6.1   6.0   Total Bilirubin 0.3 - 1.2 mg/dL 0.9   0.6   Alkaline Phos 38 - 126 U/L 202   70   AST 15 - 41 U/L 36   23   ALT 0 - 44 U/L 36   31       Assessment and Plan: * Acute on chronic combined systolic and diastolic CHF (congestive heart failure) (HCC) CHF pathway Tele monitor 2d echo Lasix  '40mg'$  IV in ED and daily Strict intake and output Daily BMP Mild trop elevation: EDP consulted cards But no CP Trop flat thus far Strongly suspect cards going to want to see trop elevate substantially and/or pt be symptomatic before theyd consider taking a 88yo DNR patient for LHC.  CKD (chronic kidney disease) stage 3, GFR 30-59 ml/min (HCC) Creat today appears baseline Monitor daily BMP with diuresis.  Paroxysmal A-fib ([place]) Cont Coreg for rate control Cont eliquis Tele monitor  Benign essential HTN Cont home BP meds (Hydralazine, imdur, coreg)      Advance Care Planning:   Code Status: DNR Yellow form in chart as well  Consults: EDP put in for cards consult  Family Communication: No family in room  Severity of Illness: The appropriate patient status for this patient is OBSERVATION. Observation status is judged to be reasonable and necessary in order to provide the required intensity of service to ensure the patient's safety. The patient's presenting symptoms, physical exam findings, and initial radiographic and laboratory data in the context of their medical condition is felt to place them at decreased risk for further clinical deterioration. Furthermore, it is anticipated that the patient will be medically stable for discharge from the hospital within 2 midnights of admission.   Author: Etta Quill., DO 03/27/2022 10:26 PM  For on call review [email or web address].

## 2022-03-26 NOTE — Assessment & Plan Note (Addendum)
Continue low-sodium diet, strict intake and output and daily weights. -Holding on any further diuresis at this moment.  Patient had developed hyponatremia, elevation in her creatinine level and demonstrating dehydration from lack of oral intake. -Continue to wean oxygen supplementation as tolerated -2D echo demonstrating no significant changes in comparison to prior echo reports with ejection fraction 03-40%, grade 1 diastolic dysfunction and global hypokinesis.   -Continue hydralazine, Imdur and carvedilol when/if able to take p.o.'s. -Patient denies chest pain. -Cardiology service will be involved for further recommendation regarding adjustment in patient's medication therapy. -Patient is currently 4.5 pounds negative since admission. - approximately 3 L negative since admission.

## 2022-03-26 NOTE — Assessment & Plan Note (Addendum)
-  Rate controlled -Prior to admission using carvedilol and Eliquis for secondary prevention. -At this moment transitioning to full comfort care and symptomatic management -Any medications not intended to keep patient comfortable will be discontinued.

## 2022-03-26 NOTE — Assessment & Plan Note (Addendum)
-  Overall appears stable and at her baseline -Continue to follow trend intermittently while receiving diuresis.

## 2022-03-26 NOTE — Assessment & Plan Note (Addendum)
-  Stable and well-controlled -Continue current antihypertensive agents if able to take by mouth. -As needed IV antihypertensive agents can be provided if required.

## 2022-03-26 NOTE — ED Provider Notes (Incomplete)
[place] EMERGENCY DEPARTMENT Provider Note   CSN: [id number] Arrival date & time: 03/01/2022  1407     History {Add pertinent medical, surgical, social history, OB history to HPI:1} Chief Complaint  Patient presents with   Shortness of Breath    [name] is a 88 y.o. female with HTN, combined systolic diastolic CHF, paroxysmal A-fib  and SSS status post pacemaker on eliquis and digoxin, CAD, CKD stage III, malnutrition, respiratory failure with hypoxia on as needed oxygen at home, anxiety who presents with SOB.  Patient presents with her good friend who provides additional history.  Patient c/o shortness of breath, productive cough and weakness worsening for the last 5 days.  She states she has been the doctor several times since beginning November for similar symptoms and has received several rounds of antibiotics as well as a round of prednisone.  She states she got better but then got worse again over the last few days.  She has not had any fevers or chills, no known sick contacts. Patient has a history of COPD and CHF. Reports she wears 2 L oxygen at home for prn use. Since last week she has been currently using her oxygen continuously. Denies any chest pain, hemoptysis, N/V/D/C, urinary symptoms.  She endorses periodic symmetric lower extremity swelling that is not worse today, and improves with her Lasix.  She been taking all her medications as prescribed including her Lasix.  She weighed 106.4 pounds this morning and per chart review her dry weight was 106 pounds in February 2023.  Endorses poor p.o. intake.    Shortness of Breath      Home Medications Prior to Admission medications   Medication Sig Start Date End Date Taking? Authorizing Provider  acetaminophen (TYLENOL) 325 MG tablet Take 2 tablets (650 mg total) by mouth every 6 (six) hours as needed for mild pain (or Fever >/= 101). 04/28/21   Shahmehdi, Valeria Batman, MD  Amino Acids-Protein Hydrolys (FEEDING SUPPLEMENT,  PRO-STAT SUGAR FREE 64,) LIQD Take 30 mLs by mouth 2 (two) times daily.    [provider]  apixaban (ELIQUIS) 2.5 MG TABS tablet Take 1 tablet (2.5 mg total) by mouth 2 (two) times daily. 05/18/21   Gerlene Fee, NP  carvedilol (COREG) 6.25 MG tablet Take 1 tablet (6.25 mg total) by mouth 2 (two) times daily with a meal. 05/18/21 07/15/21  Gerlene Fee, NP  digoxin (LANOXIN) 0.125 MG tablet Take one tablet on Monday - Friday and None on Saturday or Sunday 05/18/21   Gerlene Fee, NP  ergocalciferol (VITAMIN D2) 1.25 MG (50000 UT) capsule Take 1 capsule (50,000 Units total) by mouth once a week. 05/18/21   Gerlene Fee, NP  furosemide (LASIX) 20 MG tablet Take 1 tablet (20 mg total) by mouth 2 (two) times a week. 05/20/21   Gerlene Fee, NP  guaiFENesin (MUCINEX) 600 MG 12 hr tablet Take 1 tablet (600 mg total) by mouth 2 (two) times daily as needed for cough or to loosen phlegm. 11/16/21   Eulogio Bear, NP  hydrALAZINE (APRESOLINE) 100 MG tablet Take 1 tablet (100 mg total) by mouth 2 (two) times daily. 05/18/21   Gerlene Fee, NP  isosorbide mononitrate (IMDUR) 30 MG 24 hr tablet Take 1 tablet (30 mg total) by mouth daily. 05/18/21 07/15/21  Gerlene Fee, NP  levocetirizine (XYZAL) 5 MG tablet Take 5 mg by mouth every morning.     [provider]  levothyroxine (  SYNTHROID) 112 MCG tablet Take 1 tablet (112 mcg total) by mouth daily. 05/18/21   Gerlene Fee, NP  melatonin 1 MG TABS tablet Take 1 mg by mouth at bedtime.    [provider]  Multiple Vitamins-Minerals (PRESERVISION AREDS PO) Take 1 tablet by mouth 2 (two) times daily.    [provider]  Nutritional Supplements (ENSURE ENLIVE PO) 120 ml BID with medpass due to variable intake and malnutrition risk. Twice A Day Between Meals    [provider]  Omega-3 Fatty Acids (FISH OIL) 1000 MG CAPS Take 300-1,000 mg by mouth daily.    [provider]  omeprazole  (PRILOSEC) 20 MG capsule Take 20 mg by mouth daily.    [provider]  Polyethyl Glycol-Propyl Glycol (SYSTANE OP) Place 1 drop into both eyes 3 (three) times daily.     [provider]  Potassium 99 MG TABS Take 1 tablet by mouth 2 (two) times daily.     [provider]  sodium chloride (OCEAN) 0.65 % SOLN nasal spray Place 1 spray into both nostrils as needed for congestion. 11/16/21   Eulogio Bear, NP  VENTOLIN HFA 108 (90 Base) MCG/ACT inhaler Inhale 2 puffs into the lungs every 4 (four) hours as needed for wheezing or shortness of breath. 05/18/21   Gerlene Fee, NP  vitamin C (ASCORBIC ACID) 500 MG tablet Take 500 mg by mouth every morning.     [provider]      Allergies    Macrodantin and Sulfa antibiotics    Review of Systems   Review of Systems  Respiratory:  Positive for shortness of breath.    Review of systems Negative for f/c.  A 10 point review of systems was performed and is negative unless otherwise reported in HPI.  Physical Exam Updated Vital Signs BP (!) 138/59 (BP Location: Right Arm)   Pulse 78   Temp 97.8 F (36.6 C) (Oral)   Resp 17   Ht '5\' 4"'$  (1.626 m)   Wt 43.7 kg   SpO2 100%   BMI 16.55 kg/m  Physical Exam General: Elderly appearing female, lying in bed.  Mild respiratory distress. HEENT: Sclera anicteric, dry mucous membranes, trachea midline.  Cardiology: RRR, no murmurs/rubs/gallops. BL radial and DP pulses equal bilaterally.  Resp: Increased respiratory rate and effort. Breathing every few words on 3L [place]. Inspiratory and exipratory crackles and rales bilaterally, no wheezes.  Abd: Soft, non-tender, non-distended. No rebound tenderness or guarding.  MSK: No peripheral edema or signs of trauma. Extremities without deformity or TTP. No cyanosis or clubbing. Skin: warm, dry. No rashes or lesions. Neuro: A&Ox4, CNs II-XII grossly intact. MAEs. Sensation grossly intact.  Psych: Normal mood and affect.    ED Results / Procedures / Treatments   Labs (all labs ordered are listed, but only abnormal results are displayed) Labs Reviewed  RESP PANEL BY RT-PCR (RSV, FLU A&B, COVID)  [id number]  COMPREHENSIVE METABOLIC PANEL  CBC  BRAIN NATRIURETIC PEPTIDE    EKG EKG Interpretation  Date/Time:  Saturday March 26 2022 14:25:30 EST Ventricular Rate:  74 PR Interval:  193 QRS Duration: 188 QT Interval:  473 QTC Calculation: 525 R Axis:   -84 Text Interpretation: Sinus or ectopic atrial rhythm Multiple premature complexes, vent & supraven LVH with IVCD, LAD and secondary repol abnrm Prolonged QT interval When compared to 04/25/21 paced rhythm no longer present. Prolonged QT now present Confirmed by Margaretmary Eddy [phone]) on 03/21/2022 2:30:10 PM  Radiology DG Chest 2 View  Result Date: 03/12/2022 CLINICAL DATA:  Shortness of breath and cough. EXAM: CHEST - 2 VIEW COMPARISON:  Chest two views 04/26/2021 FINDINGS: Left chest wall cardiac pacer is seen with leads overlying the right atrium and right ventricle, similar to prior. Cardiac silhouette is again moderately enlarged. Mediastinal contours are within normal limits. Moderate to high-grade atherosclerotic calcifications within the aortic arch. Flattening of the diaphragms and moderate hyperinflation, unchanged. Mild bilateral chronic interstitial thickening is unchanged from 04/26/2021. This is slightly increased from 03/25/2021 and 08/2020. Tiny bilateral pleural effusions are decreased from 04/26/2021. No pneumothorax. Moderate multilevel degenerative disc changes of the thoracic spine. Diffuse decreased bone mineralization. IMPRESSION: 1. Cardiomegaly. 2. Mild bilateral chronic interstitial thickening, unchanged from 04/26/2021 but increased from 03/25/2021. This may represent mild interstitial pulmonary edema. 3. Tiny bilateral pleural effusions, decreased from 04/26/2021. Electronically Signed   By: Yvonne Kendall M.D.   On: 03/27/2022 14:58     Procedures Procedures  {Document cardiac monitor, telemetry assessment procedure when appropriate:1}  Medications Ordered in ED Medications - No data to display  ED Course/ Medical Decision Making/ A&P                          Medical Decision Making Amount and/or Complexity of Data Reviewed Labs:  Decision-making details documented in ED Course. Radiology:  Decision-making details documented in ED Course.  Risk Prescription drug management. Decision regarding hospitalization.    This patient presents to the ED for concern of SOB, this involves an extensive number of treatment options, and is a complaint that carries with it a high risk of complications and morbidity.  I considered the following differential and admission for this acute, potentially life threatening condition.   MDM:    DDX for dyspnea includes but is not limited to: Given patient's crackles/rales on exam, high concern for pulmonary edema. No CP to suggest MI but will evaluate for that and Arrhythmia w/ EKG/trop. Patient does have pacemaker in place, currently paced. No murmur on exam to suggest acute valvular heart disease.  Will obtain XR to evaluate for Pneumonia / pulmonary effusion. No CP or asymmetric LEE to suggest PE. No h/o COPD/RAD and no wheezing on exam to suggest this. Also consider sepsis, Anemia, electrolyte abnormalities, renal injury.    Clinical Course as of 03/20/2022 1720  Sat Mar 26, 2022  1512 DG Chest 2 View IMPRESSION: 1. Cardiomegaly. 2. Mild bilateral chronic interstitial thickening, unchanged from 04/26/2021 but increased from 03/25/2021. This may represent mild interstitial pulmonary edema. 3. Tiny bilateral pleural effusions, decreased from 04/26/2021. [HN]  1512 SpO2: 100 % [HN]  1512 O2 Flow Rate (L/min): 2 L/min [HN]  1648 Resp panel by RT-PCR (RSV, Flu A&B, Covid) Anterior Nasal Swab Neg [HN]  1649 Hemoglobin(!): 10.8 Values w/in the last year 10-13 mg/dL [HN]  1649 WBC:  8.5 No leukoctyosis [HN]  1649 Creatinine(!): 1.11 Decreased from prior [HN]  1654 B Natriuretic Peptide(!): 1,060.0 [HN]  1654 Troponin I (High Sensitivity)(!!): 348 Will consult to cardiology for NSTEMI. Give 40 mg IV lasix for pulmonary edema/HF exacerbation [HN]    Clinical Course User Index [HN] Audley Hose, MD     Labs: I Ordered, and personally interpreted labs.  The pertinent results include:  ***  Imaging Studies ordered: I ordered imaging studies including *** I independently visualized and interpreted imaging. I agree with the radiologist interpretation  Additional history obtained from ***.  External records  from outside source obtained and reviewed including ***  Cardiac Monitoring: The patient was maintained on a cardiac monitor.  I personally viewed and interpreted the cardiac monitored which showed an underlying rhythm of: ***  Reevaluation: After the interventions noted above, I reevaluated the patient and found that they have :{resolved/improved/worsened:23923::"improved"}  Social Determinants of Health: ***  Disposition:  ***  Co morbidities that complicate the patient evaluation  Past Medical History:  Diagnosis Date   Cardiomyopathy ([place])    CHF (congestive heart failure) (HCC)    Coronary artery disease    GERD (gastroesophageal reflux disease)    History of sick sinus syndrome    Hypertension    Hypothyroidism    Paroxysmal atrial fibrillation ([place])    Shortness of breath dyspnea    Thyroid disease      Medicines No orders of the defined types were placed in this encounter.   I have reviewed the patients home medicines and have made adjustments as needed  Problem List / ED Course: Problem List Items Addressed This Visit   None        Clinical Course as of 02/28/2022 1720  Sat Mar 26, 2022  1512 DG Chest 2 View IMPRESSION: 1. Cardiomegaly. 2. Mild bilateral chronic interstitial thickening, unchanged from 04/26/2021 but  increased from 03/25/2021. This may represent mild interstitial pulmonary edema. 3. Tiny bilateral pleural effusions, decreased from 04/26/2021. [HN]  1512 SpO2: 100 % [HN]  1512 O2 Flow Rate (L/min): 2 L/min [HN]  1648 Resp panel by RT-PCR (RSV, Flu A&B, Covid) Anterior Nasal Swab Neg [HN]  1649 Hemoglobin(!): 10.8 Values w/in the last year 10-13 mg/dL [HN]  1649 WBC: 8.5 No leukoctyosis [HN]  1649 Creatinine(!): 1.11 Decreased from prior [HN]  1654 B Natriuretic Peptide(!): 1,060.0 [HN]  1654 Troponin I (High Sensitivity)(!!): 348 Will consult to cardiology for NSTEMI. Give 40 mg IV lasix for pulmonary edema/HF exacerbation [HN]    Clinical Course User Index [HN] Audley Hose, MD    {Document critical care time when appropriate:1} {Document review of labs and clinical decision tools ie heart score, Chads2Vasc2 etc:1}  {Document your independent review of radiology images, and any outside records:1} {Document your discussion with family members, caretakers, and with consultants:1} {Document social determinants of health affecting pt's care:1} {Document your decision making why or why not admission, treatments were needed:1}  This note was created using dictation software, which may contain spelling or grammatical errors.

## 2022-03-27 ENCOUNTER — Observation Stay (HOSPITAL_COMMUNITY): Payer: Medicare Other

## 2022-03-27 ENCOUNTER — Other Ambulatory Visit (HOSPITAL_COMMUNITY): Payer: Self-pay | Admitting: *Deleted

## 2022-03-27 DIAGNOSIS — I2489 Other forms of acute ischemic heart disease: Secondary | ICD-10-CM | POA: Diagnosis present

## 2022-03-27 DIAGNOSIS — E46 Unspecified protein-calorie malnutrition: Secondary | ICD-10-CM | POA: Diagnosis present

## 2022-03-27 DIAGNOSIS — J9621 Acute and chronic respiratory failure with hypoxia: Secondary | ICD-10-CM | POA: Diagnosis not present

## 2022-03-27 DIAGNOSIS — J9622 Acute and chronic respiratory failure with hypercapnia: Secondary | ICD-10-CM | POA: Diagnosis not present

## 2022-03-27 DIAGNOSIS — I480 Paroxysmal atrial fibrillation: Secondary | ICD-10-CM | POA: Diagnosis not present

## 2022-03-27 DIAGNOSIS — E870 Hyperosmolality and hypernatremia: Secondary | ICD-10-CM | POA: Diagnosis not present

## 2022-03-27 DIAGNOSIS — Z1152 Encounter for screening for COVID-19: Secondary | ICD-10-CM | POA: Diagnosis not present

## 2022-03-27 DIAGNOSIS — R0602 Shortness of breath: Secondary | ICD-10-CM | POA: Diagnosis present

## 2022-03-27 DIAGNOSIS — G9341 Metabolic encephalopathy: Secondary | ICD-10-CM | POA: Diagnosis not present

## 2022-03-27 DIAGNOSIS — I2510 Atherosclerotic heart disease of native coronary artery without angina pectoris: Secondary | ICD-10-CM | POA: Diagnosis present

## 2022-03-27 DIAGNOSIS — I5021 Acute systolic (congestive) heart failure: Secondary | ICD-10-CM | POA: Diagnosis not present

## 2022-03-27 DIAGNOSIS — N1832 Chronic kidney disease, stage 3b: Secondary | ICD-10-CM | POA: Diagnosis not present

## 2022-03-27 DIAGNOSIS — J449 Chronic obstructive pulmonary disease, unspecified: Secondary | ICD-10-CM | POA: Diagnosis present

## 2022-03-27 DIAGNOSIS — Z515 Encounter for palliative care: Secondary | ICD-10-CM | POA: Diagnosis not present

## 2022-03-27 DIAGNOSIS — F419 Anxiety disorder, unspecified: Secondary | ICD-10-CM | POA: Diagnosis present

## 2022-03-27 DIAGNOSIS — N179 Acute kidney failure, unspecified: Secondary | ICD-10-CM | POA: Diagnosis not present

## 2022-03-27 DIAGNOSIS — F05 Delirium due to known physiological condition: Secondary | ICD-10-CM | POA: Diagnosis not present

## 2022-03-27 DIAGNOSIS — K219 Gastro-esophageal reflux disease without esophagitis: Secondary | ICD-10-CM | POA: Diagnosis present

## 2022-03-27 DIAGNOSIS — E039 Hypothyroidism, unspecified: Secondary | ICD-10-CM | POA: Diagnosis present

## 2022-03-27 DIAGNOSIS — I10 Essential (primary) hypertension: Secondary | ICD-10-CM | POA: Diagnosis not present

## 2022-03-27 DIAGNOSIS — I130 Hypertensive heart and chronic kidney disease with heart failure and stage 1 through stage 4 chronic kidney disease, or unspecified chronic kidney disease: Secondary | ICD-10-CM | POA: Diagnosis present

## 2022-03-27 DIAGNOSIS — Z7189 Other specified counseling: Secondary | ICD-10-CM | POA: Diagnosis not present

## 2022-03-27 DIAGNOSIS — Z681 Body mass index (BMI) 19 or less, adult: Secondary | ICD-10-CM | POA: Diagnosis not present

## 2022-03-27 DIAGNOSIS — Z66 Do not resuscitate: Secondary | ICD-10-CM | POA: Diagnosis present

## 2022-03-27 DIAGNOSIS — I5043 Acute on chronic combined systolic (congestive) and diastolic (congestive) heart failure: Secondary | ICD-10-CM | POA: Diagnosis not present

## 2022-03-27 DIAGNOSIS — E876 Hypokalemia: Secondary | ICD-10-CM | POA: Diagnosis not present

## 2022-03-27 DIAGNOSIS — E860 Dehydration: Secondary | ICD-10-CM | POA: Diagnosis present

## 2022-03-27 DIAGNOSIS — N390 Urinary tract infection, site not specified: Secondary | ICD-10-CM | POA: Diagnosis not present

## 2022-03-27 DIAGNOSIS — R64 Cachexia: Secondary | ICD-10-CM | POA: Diagnosis present

## 2022-03-27 LAB — ECHOCARDIOGRAM COMPLETE
Area-P 1/2: 3.27 cm2
Calc EF: 33.8 %
Height: 64 in
S' Lateral: 4.1 cm
Single Plane A2C EF: 32.1 %
Single Plane A4C EF: 38.6 %
Weight: 1573.2 oz

## 2022-03-27 LAB — BASIC METABOLIC PANEL
Anion gap: 10 (ref 5–15)
BUN: 25 mg/dL — ABNORMAL HIGH (ref 8–23)
CO2: 34 mmol/L — ABNORMAL HIGH (ref 22–32)
Calcium: 9.2 mg/dL (ref 8.9–10.3)
Chloride: 94 mmol/L — ABNORMAL LOW (ref 98–111)
Creatinine, Ser: 1.12 mg/dL — ABNORMAL HIGH (ref 0.44–1.00)
GFR, Estimated: 45 mL/min — ABNORMAL LOW (ref >60)
Glucose, Bld: 103 mg/dL — ABNORMAL HIGH (ref 70–99)
Potassium: 3.6 mmol/L (ref 3.5–5.1)
Sodium: 138 mmol/L (ref 135–145)

## 2022-03-27 MED ORDER — FUROSEMIDE 10 MG/ML IJ SOLN
40.0000 mg | Freq: Two times a day (BID) | INTRAMUSCULAR | Status: DC
  Administered 2022-03-27 – 2022-03-29 (×4): 40 mg via INTRAVENOUS
  Filled 2022-03-27 (×4): qty 4

## 2022-03-27 NOTE — TOC Progression Note (Signed)
  Transition of Care [facility]) Screening Note   Patient Details  Name: [name] Date of Birth: October 08, 1924   Transition of Care [facility]) CM/SW Contact:    Boneta Lucks, RN Phone Number: 03/27/2022, 8:34 AM    Transition of Care Department [facility]) has reviewed patient and no TOC needs have been identified at this time. We will continue to monitor patient advancement through interdisciplinary progression rounds. If new patient transition needs arise, please place a TOC consult.      Barriers to Discharge: Continued Medical Work up  Expected Discharge Plan and Services       Social Determinants of Health (SDOH) Interventions [place]: No Food Insecurity (03/16/2022)  Housing: Low Risk  (03/21/2022)  Transportation Needs: No Transportation Needs (03/07/2022)  Utilities: Not At Risk (03/06/2022)  Tobacco Use: Medium Risk (03/27/2022)    Readmission Risk Interventions    04/25/2021   12:12 PM  Readmission Risk Prevention Plan  Transportation Screening Complete  HRI or Home Care Consult Complete  Social Work Consult for [place] Planning/Counseling Complete  Palliative Care Screening Not Applicable  Medication Review [employer]) Complete

## 2022-03-27 NOTE — Progress Notes (Signed)
Progress Note   Patient: [name]:[id number] DOB: 1924-06-09 DOA: 03/23/2022     0 DOS: the patient was seen and examined on 03/27/2022   Brief hospital course: As per H&P written by Dr. Alcario Drought on 03/22/2022 [name] is a 88 y.o. female with medical history significant of HFrEF, PAF on eliquis, SSS s/p PPM.  Pt on PRN O2 at home.   Pt with SOB, productive cough, generalized weakness over past couple of days.  Worsening.  She states she has been the doctor several times since beginning November for similar symptoms and has received several rounds of antibiotics as well as a round of prednisone.  She states she got better but then got worse again over the last few days.  She has not had any fevers or chills, no known sick contacts.   Patient has a history of COPD and CHF. Reports she wears 2 L oxygen at home for prn use. Since last week she has been currently using her oxygen continuously. Denies any chest pain, hemoptysis, N/V/D/C, urinary symptoms.  She endorses periodic symmetric lower extremity swelling that is not worse today, and improves with her Lasix.  Assessment and Plan: * Acute on chronic combined systolic and diastolic CHF (congestive heart failure) (HCC) Continue low-sodium diet, strict intake and output and daily weights. -Continue IV diuresis (Lasix IV 40 mg every 12 hours) -Continue to wean oxygen supplementation as tolerated -2D echo demonstrating no significant changes in comparison to prior echo reports with ejection fraction 09-73%, grade 1 diastolic dysfunction and global hypokinesis.   -Continue hydralazine, Imdur and carvedilol -Patient denies chest pain. -Cardiology service will be involved for further recommendation regarding adjustment in patient's medication therapy.  CKD (chronic kidney disease) stage 3, GFR 30-59 ml/min (HCC) -Stable and at baseline -Continue to follow trend with acute diuresis.  Paroxysmal A-fib (HCC) -Continue telemetry  monitoring -Continue Eliquis for secondary prevention -Continue carvedilol for rate management.  Benign essential HTN -Stable and well-controlled -Continue current antihypertensive agents.  Hypothyroidism: -Continue Synthroid.  Subjective:  Appears calm and in no major distress; reports shortness of breath, orthopnea and short winded sensation with minimal activity.  Positive crackles on physical examination appreciated.  Using 4 L nasal cannula supplementation.  Physical Exam: Vitals:   03/15/2022 2301 03/27/22 0159 03/27/22 0421 03/27/22 1632  BP: (!) 159/60  (!) 160/47 (!) 141/55  Pulse: 72  71 72  Resp: 14  16   Temp: 98.1 F (36.7 C)  98.5 F (36.9 C) 98.6 F (37 C)  TempSrc: Oral  Oral   SpO2: 94% 90% 97% 99%  Weight:      Height:       General exam: Alert, awake, oriented x 3; hearing aids in place; reports no chest pain.  Expressing complaints of shortness of breath and orthopnea.  Reported intermittent nonproductive coughing spells. Respiratory system: Positive crackles at the bases; no using accessory muscles.  No wheezing on exam. Cardiovascular system: Rate controlled. No rubs or gallops; no JVD appreciated. Gastrointestinal system: Abdomen is nondistended, soft and nontender. No organomegaly or masses felt. Normal bowel sounds heard. Central nervous system: Alert and oriented. No focal neurological deficits. Extremities: No cyanosis or clubbing; no edema appreciated. Skin: No petechiae. Psychiatry: Judgement and insight appear normal. Mood & affect appropriate.   Data Reviewed: Basic metabolic panel: Sodium 532, potassium 3.6, chloride 94, BUN 25, creatinine 1.12, GFR 45. 2D echo: 1. Left ventricular ejection fraction, by estimation, is 20 to 25%. The  left  ventricle has severely decreased function. The left ventricle  demonstrates global hypokinesis. Left ventricular diastolic parameters are  consistent with Grade I diastolic  dysfunction (impaired  relaxation).   2. Right ventricular systolic function is normal. The right ventricular  size is normal.   3. Left atrial size was mildly dilated.   4. Right atrial size was mildly dilated.   5. A small pericardial effusion is present.   6. The mitral valve is normal in structure. Trivial mitral valve  regurgitation.   7. The aortic valve is tricuspid. There is mild calcification of the  aortic valve. There is mild thickening of the aortic valve. Aortic valve  regurgitation is not visualized. Aortic valve sclerosis/calcification is  present, without any evidence of  aortic stenosis.   8. The inferior vena cava is dilated in size with >50% respiratory  variability, suggesting right atrial pressure of 8 mmHg.  Family Communication: Close family friend updated at bedside as per patient request.  Disposition: Status is: Inpatient Remains inpatient appropriate because: Continue IV diuresis.   Planned Discharge Destination: Home  Time spent: 45 minutes  Author: Barton Dubois, MD 03/27/2022 5:48 PM  For on call review [email or web address].

## 2022-03-27 NOTE — Care Management Obs Status (Signed)
[place] NOTIFICATION   Patient Details  Name: [name] MRN: [id number] Date of Birth: September 11, 1924   Medicare Observation Status Notification Given:  Yes    Boneta Lucks, RN 03/27/2022, 11:28 AM

## 2022-03-27 NOTE — Progress Notes (Signed)
*  PRELIMINARY RESULTS* Echocardiogram 2D Echocardiogram has been performed.  [name] 03/27/2022, 12:28 PM

## 2022-03-27 NOTE — Progress Notes (Signed)
   03/27/22 1056  ReDS Vest / Clip  Station Marker A  Ruler Value 32  ReDS Value Range < 36  ReDS Actual Value 19

## 2022-03-28 DIAGNOSIS — E876 Hypokalemia: Secondary | ICD-10-CM | POA: Insufficient documentation

## 2022-03-28 LAB — BASIC METABOLIC PANEL
Anion gap: 11 (ref 5–15)
BUN: 27 mg/dL — ABNORMAL HIGH (ref 8–23)
CO2: 36 mmol/L — ABNORMAL HIGH (ref 22–32)
Calcium: 9 mg/dL (ref 8.9–10.3)
Chloride: 89 mmol/L — ABNORMAL LOW (ref 98–111)
Creatinine, Ser: 1.34 mg/dL — ABNORMAL HIGH (ref 0.44–1.00)
GFR, Estimated: 37 mL/min — ABNORMAL LOW (ref >60)
Glucose, Bld: 107 mg/dL — ABNORMAL HIGH (ref 70–99)
Potassium: 3.3 mmol/L — ABNORMAL LOW (ref 3.5–5.1)
Sodium: 136 mmol/L (ref 135–145)

## 2022-03-28 MED ORDER — POTASSIUM CHLORIDE CRYS ER 20 MEQ PO TBCR
40.0000 meq | EXTENDED_RELEASE_TABLET | Freq: Once | ORAL | Status: AC
  Administered 2022-03-28: 40 meq via ORAL
  Filled 2022-03-28: qty 2

## 2022-03-28 NOTE — Assessment & Plan Note (Addendum)
-  In the setting of diuresis. -latest Potassium 3.8; continue to follow electrolytes intermittently.  -further replete as needed.

## 2022-03-28 NOTE — Progress Notes (Signed)
Progress Note   Patient: [name]:[id number] DOB: September 29, 1924 DOA: 03/08/2022     1 DOS: the patient was seen and examined on 03/28/2022   Brief hospital course: As per H&P written by Dr. Alcario Drought on 03/06/2022 [name] is a 89 y.o. female with medical history significant of HFrEF, PAF on eliquis, SSS s/p PPM.  Pt on PRN O2 at home.   Pt with SOB, productive cough, generalized weakness over past couple of days.  Worsening.  She states she has been the doctor several times since beginning November for similar symptoms and has received several rounds of antibiotics as well as a round of prednisone.  She states she got better but then got worse again over the last few days.  She has not had any fevers or chills, no known sick contacts.   Patient has a history of COPD and CHF. Reports she wears 2 L oxygen at home for prn use. Since last week she has been currently using her oxygen continuously. Denies any chest pain, hemoptysis, N/V/D/C, urinary symptoms.  She endorses periodic symmetric lower extremity swelling that is not worse today, and improves with her Lasix.  Assessment and Plan: * Acute on chronic combined systolic and diastolic CHF (congestive heart failure) (HCC) Continue low-sodium diet, strict intake and output and daily weights. -Continue IV diuresis (Lasix IV 40 mg every 12 hours) -Continue to wean oxygen supplementation as tolerated -2D echo demonstrating no significant changes in comparison to prior echo reports with ejection fraction 16-38%, grade 1 diastolic dysfunction and global hypokinesis.   -Continue hydralazine, Imdur and carvedilol -Patient denies chest pain. -Cardiology service will be involved for further recommendation regarding adjustment in patient's medication therapy. -Patient is currently 4.5 pounds negative since admission.  CKD (chronic kidney disease) stage 3, GFR 30-59 ml/min (HCC) -Stable and at baseline -Continue to follow trend with acute  diuresis.  Paroxysmal A-fib (HCC) -Continue telemetry monitoring -Continue Eliquis for secondary prevention -Continue carvedilol for rate management.  Benign essential HTN -Stable and well-controlled -Continue current antihypertensive agents.  Hypokalemia -In the setting of diuresis -Continue to follow electrolytes trend and further replete as needed.  Hypothyroidism: -Continue Synthroid.  Subjective:  Appears calm, in no major distress and denying chest pain.  Requiring 3 L of oxygen supplementation, short winded with exertion and expressing complaints of orthopnea.  Good urine output has been appreciated.  Patient roughly 1.8 L negative since admission.  Physical Exam: Vitals:   03/27/22 2004 03/28/22 0336 03/28/22 0500 03/28/22 0910  BP: (!) 141/41 (!) 114/44  (!) 145/60  Pulse: 79 71  68  Resp: 15 18    Temp: 98.4 F (36.9 C) 97.9 F (36.6 C)    TempSrc: Oral     SpO2: 97% 96%    Weight:   43.6 kg   Height:       General exam: Alert, awake, oriented and following commands appropriately; patient had of hearing with hearing aids in place.  Reports no chest pain.  Is present improvement in her breathing.  Still having some orthopnea, requiring oxygen supplementation and feeling short winded with activity. Respiratory system: Positive crackles on auscultation; no wheezing, positive rhonchi appreciated bilaterally.  No using accessory muscle. Cardiovascular system: Rate controlled, no rubs, no gallops, no JVD on exam. Gastrointestinal system: Abdomen is nondistended, soft and nontender. No organomegaly or masses felt. Normal bowel sounds heard. Central nervous system: Alert and oriented.  Hard of hearing; no new focal deficits. Extremities: No cyanosis, clubbing or  edema. Skin: No petechiae. Psychiatry: Judgement and insight appear normal. Mood & affect appropriate.   Data Reviewed: Basic metabolic panel: Sodium 258, potassium 3.3, chloride 89, bicarb 36, BUN 27 and  creatinine 1.34; GFR 37.  2D echo: 1. Left ventricular ejection fraction, by estimation, is 20 to 25%. The  left ventricle has severely decreased function. The left ventricle  demonstrates global hypokinesis. Left ventricular diastolic parameters are  consistent with Grade I diastolic  dysfunction (impaired relaxation).   2. Right ventricular systolic function is normal. The right ventricular  size is normal.   3. Left atrial size was mildly dilated.   4. Right atrial size was mildly dilated.   5. A small pericardial effusion is present.   6. The mitral valve is normal in structure. Trivial mitral valve  regurgitation.   7. The aortic valve is tricuspid. There is mild calcification of the  aortic valve. There is mild thickening of the aortic valve. Aortic valve  regurgitation is not visualized. Aortic valve sclerosis/calcification is  present, without any evidence of  aortic stenosis.   8. The inferior vena cava is dilated in size with >50% respiratory  variability, suggesting right atrial pressure of 8 mmHg.   Family Communication: Family updated over the phone.  Disposition: Status is: Inpatient Remains inpatient appropriate because: Continue IV diuresis.   Planned Discharge Destination: Home  Time spent: 45 minutes  Author: Barton Dubois, MD 03/28/2022 1:10 PM  For on call review [email or web address].

## 2022-03-28 DEATH — deceased

## 2022-03-29 LAB — BASIC METABOLIC PANEL
Anion gap: 12 (ref 5–15)
BUN: 37 mg/dL — ABNORMAL HIGH (ref 8–23)
CO2: 37 mmol/L — ABNORMAL HIGH (ref 22–32)
Calcium: 9.5 mg/dL (ref 8.9–10.3)
Chloride: 88 mmol/L — ABNORMAL LOW (ref 98–111)
Creatinine, Ser: 1.28 mg/dL — ABNORMAL HIGH (ref 0.44–1.00)
GFR, Estimated: 39 mL/min — ABNORMAL LOW (ref >60)
Glucose, Bld: 130 mg/dL — ABNORMAL HIGH (ref 70–99)
Potassium: 3.7 mmol/L (ref 3.5–5.1)
Sodium: 137 mmol/L (ref 135–145)

## 2022-03-29 LAB — URINALYSIS, ROUTINE W REFLEX MICROSCOPIC
Bilirubin Urine: NEGATIVE
Glucose, UA: NEGATIVE mg/dL
Hgb urine dipstick: NEGATIVE
Ketones, ur: NEGATIVE mg/dL
Nitrite: POSITIVE — AB
Protein, ur: NEGATIVE mg/dL
Specific Gravity, Urine: 1.009 (ref 1.005–1.030)
pH: 5 (ref 5.0–8.0)

## 2022-03-29 LAB — GLUCOSE, CAPILLARY: Glucose-Capillary: 121 mg/dL — ABNORMAL HIGH (ref 70–99)

## 2022-03-29 MED ORDER — FUROSEMIDE 10 MG/ML IJ SOLN
40.0000 mg | Freq: Every day | INTRAMUSCULAR | Status: DC
  Administered 2022-03-30: 40 mg via INTRAVENOUS
  Filled 2022-03-29: qty 4

## 2022-03-29 MED ORDER — QUETIAPINE FUMARATE 25 MG PO TABS
12.5000 mg | ORAL_TABLET | Freq: Every day | ORAL | Status: DC
  Administered 2022-03-29 – 2022-03-30 (×2): 12.5 mg via ORAL
  Filled 2022-03-29 (×2): qty 1

## 2022-03-29 NOTE — TOC Initial Note (Signed)
Transition of Care [facility]) - Initial/Assessment Note    Patient Details  Name: [name] MRN: [id number] Date of Birth: 03-31-1924  Transition of Care [facility]) CM/SW Contact:    Ihor Gully, LCSW Phone Number: 03/29/2022, 12:04 PM  Clinical Narrative:                 Patient from home, admitted for CHF. TOC consulted for HF home screen. [facility] order placed for gero NP to follow up with patient for CHF.   Expected Discharge Plan: Skilled Nursing Facility Barriers to Discharge: Continued Medical Work up   Patient Goals and CMS Choice            Expected Discharge Plan and Services                                              Prior Living Arrangements/Services                       Activities of Daily Living Home Assistive Devices/Equipment: None ADL Screening (condition at time of admission) Patient's cognitive ability adequate to safely complete daily activities?: Yes Is the patient deaf or have difficulty hearing?: No Does the patient have difficulty seeing, even when wearing glasses/contacts?: No Does the patient have difficulty concentrating, remembering, or making decisions?: No Does the patient have difficulty dressing or bathing?: No Does the patient have difficulty walking or climbing stairs?: No Weakness of Legs: Both Weakness of Arms/Hands: Both  Permission Sought/Granted                  Emotional Assessment              Admission diagnosis:  Acute on chronic combined systolic and diastolic CHF (congestive heart failure) ([place]) [I50.43] Patient Active Problem List   Diagnosis Date Noted   Hypokalemia 03/28/2022   Acute renal failure superimposed on stage 3 chronic kidney disease ([place]) 05/10/2021   Severe protein-calorie malnutrition ([place]) 05/10/2021   Paroxysmal A-fib ([place]) 05/04/2021   Acquired hypothyroidism 05/04/2021   Vitamin D deficiency 05/04/2021   Chronic non-seasonal allergic rhinitis 05/04/2021   CAD (coronary  artery disease), native coronary artery 05/04/2021   Aortic atherosclerosis ([place]) 05/04/2021   Anxiety 05/04/2021   Respiratory failure with hypoxia ([place]) 05/04/2021   Leukocytosis 05/04/2021   CKD (chronic kidney disease) stage 3, GFR 30-59 ml/min ([place]) 05/04/2021   Hypertensive urgency 05/01/2021   E. coli UTI 05/01/2021   Acute exacerbation of CHF (congestive heart failure) ([place]) 04/24/2021   Acute on chronic combined systolic and diastolic CHF (congestive heart failure) ([place]) 04/24/2021   Hypertensive emergency 04/24/2021   SOB (shortness of breath) 12/26/2014   Cardiac pacemaker in situ 09/30/2013   Benign essential HTN 09/30/2013   Plantar fasciitis of left foot 06/21/2011   PCP:  Sharilyn Sites, MD Pharmacy:  No Pharmacies Listed    Social Determinants of Health (SDOH) Social History: SDOH Screenings   Food Insecurity: No Food Insecurity (03/03/2022)  Housing: Low Risk  (03/09/2022)  Transportation Needs: No Transportation Needs (03/11/2022)  Utilities: Not At Risk (03/12/2022)  Tobacco Use: Medium Risk (02/28/2022)   SDOH Interventions:     Readmission Risk Interventions    04/25/2021   12:12 PM  Readmission Risk Prevention Plan  Transportation Screening Complete  HRI or Home Care Consult Complete  Social Work Consult for [place]  Care Planning/Counseling Complete  Palliative Care Screening Not Applicable  Medication Review (RN Care Manager) Complete

## 2022-03-29 NOTE — Progress Notes (Signed)
Went into pt's room d/t c/o abd pain. Bladder scanned pt, >580 mL in bladder. This nurse and NT helped pt to [facility]. Output 400 mL. Pt also c/o pain while urinating. MD notified. See new orders.

## 2022-03-29 NOTE — Progress Notes (Signed)
Progress Note   Patient: [name] DSK:[id number] DOB: October 13, 1924 DOA: 03/07/2022     2 DOS: the patient was seen and examined on 03/29/2022   Brief hospital course: As per H&P written by Dr. Alcario Drought on 03/17/2022 [name] is a 89 y.o. female with medical history significant of HFrEF, PAF on eliquis, SSS s/p PPM.  Pt on PRN O2 at home.   Pt with SOB, productive cough, generalized weakness over past couple of days.  Worsening.  She states she has been the doctor several times since beginning November for similar symptoms and has received several rounds of antibiotics as well as a round of prednisone.  She states she got better but then got worse again over the last few days.  She has not had any fevers or chills, no known sick contacts.   Patient has a history of COPD and CHF. Reports she wears 2 L oxygen at home for prn use. Since last week she has been currently using her oxygen continuously. Denies any chest pain, hemoptysis, N/V/D/C, urinary symptoms.  She endorses periodic symmetric lower extremity swelling that is not worse today, and improves with her Lasix.  Assessment and Plan: * Acute on chronic combined systolic and diastolic CHF (congestive heart failure) (HCC) Continue low-sodium diet, strict intake and output and daily weights. -Continue IV diuresis (Lasix IV 40 mg every 12 hours) -Continue to wean oxygen supplementation as tolerated -2D echo demonstrating no significant changes in comparison to prior echo reports with ejection fraction 62-03%, grade 1 diastolic dysfunction and global hypokinesis.   -Continue hydralazine, Imdur and carvedilol -Patient denies chest pain. -Cardiology service will be involved for further recommendation regarding adjustment in patient's medication therapy. -Patient is currently 4.5 pounds negative since admission.  CKD (chronic kidney disease) stage 3, GFR 30-59 ml/min (HCC) -Stable and at baseline -Continue to follow trend with acute  diuresis.  Paroxysmal A-fib (HCC) -Continue telemetry monitoring -Continue Eliquis for secondary prevention -Continue carvedilol for rate management.  Benign essential HTN -Stable and well-controlled -Continue current antihypertensive agents.  Hypokalemia -In the setting of diuresis -Continue to follow electrolytes trend and further replete as needed.  Hypothyroidism: -Continue Synthroid.  Dysuria -Will check urinalysis and urine culture -Currently afebrile and without elevation in WBCs -Advised to maintain adequate hydration.  Weakness/physical deconditioning -Prior to admission was living independently; family concern of his ability to perform activities of daily living in current condition. -Physical therapy evaluation has been requested and TOC has been made aware.  Sundowning/hospital-acquired delirium -Minimize sedative agents -Low-dose Seroquel at bedtime will be initiated -Continue constant reorientation.  Subjective:  Still requiring 3-4 L nasal cannula supplementation to maintain saturations; no fever, no chest pain.  Patient significantly weak/deconditioned, complaining of dysuria and oriented only to person.  Physical Exam: Vitals:   03/28/22 1522 03/28/22 1958 03/29/22 0513 03/29/22 1355  BP: (!) 143/64 (!) 158/63 (!) 151/54 (!) 155/58  Pulse: 72 75 74 80  Resp: 16 17 (!) 23 18  Temp: (!) 97.5 F (36.4 C) (!) 97.4 F (36.3 C) (!) 97.4 F (36.3 C) (!) 97.3 F (36.3 C)  TempSrc: Oral   Oral  SpO2: 97% 92% 97% 90%  Weight:   44.2 kg   Height:       General exam: Alert, awake, oriented x 1; weak, deconditioned and short winded with minimal exertion.  Reporting dysuria. Respiratory system: Improved air movement bilaterally; no wheezes, no frank crackles. Cardiovascular system: Rate controlled, no rubs, no gallops, no JVD Gastrointestinal  system: Abdomen is nondistended, soft and nontender. No organomegaly or masses felt. Normal bowel sounds  heard. Central nervous system: Alert and oriented. No focal neurological deficits. Extremities: No cyanosis or clubbing. Skin: No petechiae. Psychiatry: Judgement and insight appear normal. Mood & affect appropriate.   Data Reviewed: Basic metabolic panel: Sodium 664, potassium 3.7, chloride 88, bicarb 37, BUN 37, creatinine 1.28 and GFR 39.  2D echo: 1. Left ventricular ejection fraction, by estimation, is 20 to 25%. The  left ventricle has severely decreased function. The left ventricle  demonstrates global hypokinesis. Left ventricular diastolic parameters are  consistent with Grade I diastolic  dysfunction (impaired relaxation).   2. Right ventricular systolic function is normal. The right ventricular  size is normal.   3. Left atrial size was mildly dilated.   4. Right atrial size was mildly dilated.   5. A small pericardial effusion is present.   6. The mitral valve is normal in structure. Trivial mitral valve  regurgitation.   7. The aortic valve is tricuspid. There is mild calcification of the  aortic valve. There is mild thickening of the aortic valve. Aortic valve  regurgitation is not visualized. Aortic valve sclerosis/calcification is  present, without any evidence of  aortic stenosis.   8. The inferior vena cava is dilated in size with >50% respiratory  variability, suggesting right atrial pressure of 8 mmHg.   Family Communication: Family updated over the phone.  Disposition: Status is: Inpatient Remains inpatient appropriate because: Continue IV diuresis.   Planned Discharge Destination: Home  Time spent: 45 minutes  Author: Barton Dubois, MD 03/29/2022 5:41 PM  For on call review [email or web address].

## 2022-03-30 ENCOUNTER — Inpatient Hospital Stay (HOSPITAL_COMMUNITY): Payer: Medicare Other

## 2022-03-30 DIAGNOSIS — I5043 Acute on chronic combined systolic (congestive) and diastolic (congestive) heart failure: Secondary | ICD-10-CM

## 2022-03-30 DIAGNOSIS — N1832 Chronic kidney disease, stage 3b: Secondary | ICD-10-CM

## 2022-03-30 DIAGNOSIS — I10 Essential (primary) hypertension: Secondary | ICD-10-CM

## 2022-03-30 DIAGNOSIS — I480 Paroxysmal atrial fibrillation: Secondary | ICD-10-CM

## 2022-03-30 LAB — BASIC METABOLIC PANEL
Anion gap: 10 (ref 5–15)
BUN: 47 mg/dL — ABNORMAL HIGH (ref 8–23)
CO2: 41 mmol/L — ABNORMAL HIGH (ref 22–32)
Calcium: 9.8 mg/dL (ref 8.9–10.3)
Chloride: 87 mmol/L — ABNORMAL LOW (ref 98–111)
Creatinine, Ser: 1.24 mg/dL — ABNORMAL HIGH (ref 0.44–1.00)
GFR, Estimated: 40 mL/min — ABNORMAL LOW (ref >60)
Glucose, Bld: 111 mg/dL — ABNORMAL HIGH (ref 70–99)
Potassium: 3.5 mmol/L (ref 3.5–5.1)
Sodium: 138 mmol/L (ref 135–145)

## 2022-03-30 MED ORDER — FUROSEMIDE 10 MG/ML IJ SOLN
20.0000 mg | Freq: Every day | INTRAMUSCULAR | Status: DC
  Administered 2022-03-31 – 2022-04-02 (×3): 20 mg via INTRAVENOUS
  Filled 2022-03-30 (×3): qty 2

## 2022-03-30 MED ORDER — SODIUM CHLORIDE 0.9 % IV SOLN
1.0000 g | INTRAVENOUS | Status: DC
  Administered 2022-03-30 – 2022-04-01 (×3): 1 g via INTRAVENOUS
  Filled 2022-03-30 (×3): qty 10

## 2022-03-30 NOTE — TOC Initial Note (Signed)
Transition of Care [facility]) - Initial/Assessment Note    Patient Details  Name: [name] MRN: [id number] Date of Birth: 09/30/1924  Transition of Care [facility]) CM/SW Contact:    Ihor Gully, LCSW Phone Number: 03/30/2022, 12:11 PM  Clinical Narrative:                 Patient from home alone. Admitted for CHF. Has caregiver who checks on her, assists with baths/showers, medications, runs errands, and transports to medical appointments. At baseline, she is independent with ambulation. PT recommends SNF. [name], is agreeable to SNF. Referred to facility of choice.   Expected Discharge Plan: Skilled Nursing Facility Barriers to Discharge: Continued Medical Work up   Patient Goals and CMS Choice Patient states their goals for this hospitalization and ongoing recovery are:: possibly rehab          Expected Discharge Plan and Services     Post Acute Care Choice: [place] arrangements for the past 2 months: Single Family Home                                      Prior Living Arrangements/Services Living arrangements for the past 2 months: Single Family Home Lives with:: Self Patient language and need for interpreter reviewed:: Yes        Need for Family Participation in Patient Care: Yes (Comment) Care giver support system in place?: Yes (comment) Current home services: DME Criminal Activity/Legal Involvement Pertinent to Current Situation/Hospitalization: No - Comment as needed  Activities of Daily Living Home Assistive Devices/Equipment: None ADL Screening (condition at time of admission) Patient's cognitive ability adequate to safely complete daily activities?: Yes Is the patient deaf or have difficulty hearing?: No Does the patient have difficulty seeing, even when wearing glasses/contacts?: No Does the patient have difficulty concentrating, remembering, or making decisions?: No Does the patient have difficulty dressing or  bathing?: No Does the patient have difficulty walking or climbing stairs?: No Weakness of Legs: Both Weakness of Arms/Hands: Both  Permission Sought/Granted Permission sought to share information with : Family Supports    Share Information with NAME: Joneen Caraway           Emotional Assessment       Orientation: : Oriented to Self Alcohol / Substance Use: Not Applicable Psych Involvement: No (comment)  Admission diagnosis:  Acute on chronic combined systolic and diastolic CHF (congestive heart failure) ([place]) [I50.43] Patient Active Problem List   Diagnosis Date Noted   Hypokalemia 03/28/2022   Acute renal failure superimposed on stage 3 chronic kidney disease ([place]) 05/10/2021   Severe protein-calorie malnutrition ([place]) 05/10/2021   Paroxysmal A-fib ([place]) 05/04/2021   Acquired hypothyroidism 05/04/2021   Vitamin D deficiency 05/04/2021   Chronic non-seasonal allergic rhinitis 05/04/2021   CAD (coronary artery disease), native coronary artery 05/04/2021   Aortic atherosclerosis ([place]) 05/04/2021   Anxiety 05/04/2021   Respiratory failure with hypoxia ([place]) 05/04/2021   Leukocytosis 05/04/2021   CKD (chronic kidney disease) stage 3, GFR 30-59 ml/min ([place]) 05/04/2021   Hypertensive urgency 05/01/2021   E. coli UTI 05/01/2021   Acute exacerbation of CHF (congestive heart failure) ([place]) 04/24/2021   Acute on chronic combined systolic and diastolic CHF (congestive heart failure) ([place]) 04/24/2021   Hypertensive emergency 04/24/2021   SOB (shortness of breath) 12/26/2014   Cardiac pacemaker in situ 09/30/2013   Benign essential HTN 09/30/2013   Plantar  fasciitis of left foot 06/21/2011   PCP:  Sharilyn Sites, MD Pharmacy:  No Pharmacies Listed    Social Determinants of Health (SDOH) Social History: [place]: No Food Insecurity (02/27/2022)  Housing: Low Risk  (03/24/2022)  Transportation Needs: No Transportation Needs (03/21/2022)  Utilities: Not At  Risk (03/17/2022)  Tobacco Use: Medium Risk (03/21/2022)   SDOH Interventions:     Readmission Risk Interventions    04/25/2021   12:12 PM  Readmission Risk Prevention Plan  Transportation Screening Complete  HRI or [place] Complete  Social Work Consult for [place] Planning/Counseling Complete  Palliative Care Screening Not Applicable  Medication Review Press photographer) Complete

## 2022-03-30 NOTE — NC FL2 (Signed)
[place] LEVEL OF CARE FORM     IDENTIFICATION  Patient Name: [name] Birthdate: 1924/12/02 Sex: female Admission Date (Current Location): 03/12/2022  [facility] and [place] Number:  [employer] and Address:  [place] [address] [place]      Provider Number: [id number]  Attending Physician Name and Address:  Barton Dubois, MD  Relative Name and Phone Number:  Jones Broom  [phone], [facility]) [phone]    Current Level of Care: Hospital Recommended Level of Care: [place] Prior Approval Number:    Date Approved/Denied:   PASRR Number: [id number]  Discharge Plan: SNF    Current Diagnoses: Patient Active Problem List   Diagnosis Date Noted   Hypokalemia 03/28/2022   Acute renal failure superimposed on stage 3 chronic kidney disease ([place]) 05/10/2021   Severe protein-calorie malnutrition ([place]) 05/10/2021   Paroxysmal A-fib ([place]) 05/04/2021   Acquired hypothyroidism 05/04/2021   Vitamin D deficiency 05/04/2021   Chronic non-seasonal allergic rhinitis 05/04/2021   CAD (coronary artery disease), native coronary artery 05/04/2021   Aortic atherosclerosis ([place]) 05/04/2021   Anxiety 05/04/2021   Respiratory failure with hypoxia ([place]) 05/04/2021   Leukocytosis 05/04/2021   CKD (chronic kidney disease) stage 3, GFR 30-59 ml/min (HCC) 05/04/2021   Hypertensive urgency 05/01/2021   E. coli UTI 05/01/2021   Acute exacerbation of CHF (congestive heart failure) ([place]) 04/24/2021   Acute on chronic combined systolic and diastolic CHF (congestive heart failure) ([place]) 04/24/2021   Hypertensive emergency 04/24/2021   SOB (shortness of breath) 12/26/2014   Cardiac pacemaker in situ 09/30/2013   Benign essential HTN 09/30/2013   Plantar fasciitis of left foot 06/21/2011    Orientation RESPIRATION BLADDER Height & Weight     Self  Normal Continent Weight: 98 lb 8.7 oz (44.7  kg) Height:  '5\' 4"'$  (162.6 cm)  BEHAVIORAL SYMPTOMS/MOOD NEUROLOGICAL BOWEL NUTRITION STATUS      Continent Diet (heart healthy)  AMBULATORY STATUS COMMUNICATION OF NEEDS Skin   Extensive Assist Verbally Normal                       Personal Care Assistance Level of Assistance  Bathing, Feeding, Dressing Bathing Assistance: Maximum assistance Feeding assistance: Limited assistance Dressing Assistance: Maximum assistance     Functional Limitations Info  Sight, Hearing, Speech Sight Info: Adequate Hearing Info: Adequate Speech Info: Adequate    SPECIAL CARE FACTORS FREQUENCY  PT (By licensed PT)     PT Frequency: 5x/week              Contractures Contractures Info: Not present    Additional Factors Info  Code Status, Allergies Code Status Info: DNR Allergies Info: Macrodantin, Sulfa antibiotics           Current Medications (03/30/2022):  This is the current hospital active medication list Current Facility-Administered Medications  Medication Dose Route Frequency Provider Last Rate Last Admin   0.9 %  sodium chloride infusion  250 mL Intravenous PRN Etta Quill, DO       acetaminophen (TYLENOL) tablet 650 mg  650 mg Oral Q4H PRN Etta Quill, DO   650 mg at 03/28/22 0122   albuterol (PROVENTIL) (2.5 MG/3ML) 0.083% nebulizer solution 2.5 mg  2.5 mg Inhalation Q4H PRN Etta Quill, DO   2.5 mg at 03/27/22 0152   apixaban (ELIQUIS) tablet 2.5 mg  2.5 mg Oral BID Etta Quill, DO  2.5 mg at 03/30/22 7622   carvedilol (COREG) tablet 6.25 mg  6.25 mg Oral BID WC Jennette Kettle M, DO   6.25 mg at 03/29/22 1711   cefTRIAXone (ROCEPHIN) 1 g in sodium chloride 0.9 % 100 mL IVPB  1 g Intravenous Q24H Barton Dubois, MD 200 mL/hr at 03/30/22 1033 1 g at 03/30/22 1033   feeding supplement (ENSURE ENLIVE / ENSURE PLUS) liquid 120 mL  120 mL Oral BID BM Etta Quill, DO   120 mL at 03/29/22 1101   furosemide (LASIX) injection 40 mg  40 mg Intravenous Daily  Barton Dubois, MD   40 mg at 03/30/22 6333   hydrALAZINE (APRESOLINE) tablet 100 mg  100 mg Oral BID Etta Quill, DO   100 mg at 03/29/22 2140   ipratropium (ATROVENT) 0.06 % nasal spray 1 spray  1 spray Each Nare BID Etta Quill, DO   1 spray at 03/29/22 2142   isosorbide mononitrate (IMDUR) 24 hr tablet 30 mg  30 mg Oral Daily Jennette Kettle M, DO   30 mg at 03/30/22 5456   levothyroxine (SYNTHROID) tablet 112 mcg  112 mcg Oral Daily Etta Quill, DO   112 mcg at 03/30/22 0455   loratadine (CLARITIN) tablet 10 mg  10 mg Oral q morning Etta Quill, DO   10 mg at 03/30/22 2563   melatonin tablet 3 mg  3 mg Oral QHS Etta Quill, DO   3 mg at 03/29/22 2140   ondansetron (ZOFRAN) injection 4 mg  4 mg Intravenous Q6H PRN Etta Quill, DO       polyvinyl alcohol (LIQUIFILM TEARS) 1.4 % ophthalmic solution 1 drop  1 drop Both Eyes TID Etta Quill, DO   1 drop at 03/29/22 2142   QUEtiapine (SEROQUEL) tablet 12.5 mg  12.5 mg Oral QHS Barton Dubois, MD   12.5 mg at 03/29/22 2140   sodium chloride flush (NS) 0.9 % injection 3 mL  3 mL Intravenous Q12H Jennette Kettle M, DO   3 mL at 03/30/22 8937   sodium chloride flush (NS) 0.9 % injection 3 mL  3 mL Intravenous PRN Etta Quill, DO         Discharge Medications: Please see discharge summary for a list of discharge medications.  Relevant Imaging Results:  Relevant Lab Results:   Additional Information SS# [id number]  Ihor Gully, LCSW

## 2022-03-30 NOTE — Progress Notes (Signed)
Pt at this time is refusing morning medication she states " please I don't want it" repeatedly. Friend at bedside and states that she is weaker than she has been and refused to eat or drink anything for him. MD notified

## 2022-03-30 NOTE — Progress Notes (Addendum)
Patient slept well until 0430. She woke up at 0430 requesting to get up. This Probation officer and [place], NT got patient up to recliner. She is a 2+ max assist with transfers. She continued to yell I need to get up. Attempted to reorient patient numerous times.   7034: Pt back in bed.

## 2022-03-30 NOTE — Progress Notes (Signed)
Pt has been ambulated to the [facility] with max assist. Pt is very weak and unsteady. Pt was then placed back in the bed with maximum assist. Pt family is bedside.

## 2022-03-30 NOTE — Progress Notes (Signed)
Nurse called bedside by pts family member . Pts family stated that the pt was more confused and is incomprehensible. Upon assessment pt is unable to state her name and was rambling incomprehensible speech. Vitals obtained and Lonn Georgia RN called to help assess pt. MD notified and CT ordered.

## 2022-03-30 NOTE — Evaluation (Signed)
Physical Therapy Evaluation Patient Details Name: [name] MRN: [id number] DOB: 07-30-1924 Today's Date: 03/30/2022  History of Present Illness  [name] is a 89 y.o. female with medical history significant of HFrEF, PAF on eliquis, SSS s/p PPM.  Pt on PRN O2 at home.     Pt with SOB, productive cough, generalized weakness over past couple of days.  Worsening.  She states she has been the doctor several times since beginning November for similar symptoms and has received several rounds of antibiotics as well as a round of prednisone.  She states she got better but then got worse again over the last few days.  She has not had any fevers or chills, no known sick contacts.     Patient has a history of COPD and CHF. Reports she wears 2 L oxygen at home for prn use. Since last week she has been currently using her oxygen continuously. Denies any chest pain, hemoptysis, N/V/D/C, urinary symptoms.  She endorses periodic symmetric lower extremity swelling that is not worse today, and improves with her Lasix.    Clinical Impression  Patient limited for functional mobility as stated below secondary to weakness, fatigue and lethargy. Patient's friend present for session.At beginning of session, patient was curled up in sidelying in chair. Patient given cueing for UE/LE mobility with poor to fair carry over. She is able to pull with BUE some initially but then participation was limited due to confusion and lethargy. Repositioned patient in chair in which she requires max assist. Patient will benefit from continued physical therapy in hospital and recommended venue below to increase strength, balance, endurance for safe ADLs and gait.        Recommendations for follow up therapy are one component of a multi-disciplinary discharge planning process, led by the attending physician.  Recommendations may be updated based on patient status, additional functional criteria and insurance authorization.  Follow Up  Recommendations Skilled nursing-short term rehab (<3 hours/day) Can patient physically be transported by private vehicle: No    Assistance Recommended at Discharge Frequent or constant Supervision/Assistance  Patient can return home with the following  A lot of help with walking and/or transfers;A lot of help with bathing/dressing/bathroom;Assistance with cooking/housework    Equipment Recommendations None recommended by PT  Recommendations for Other Services       Functional Status Assessment Patient has had a recent decline in their functional status and demonstrates the ability to make significant improvements in function in a reasonable and predictable amount of time.     Precautions / Restrictions Precautions Precautions: Fall Restrictions Weight Bearing Restrictions: No      Mobility  Bed Mobility Overal bed mobility: Needs Assistance Bed Mobility: Sidelying to Sit   Sidelying to sit: Max assist       General bed mobility comments: patient sidelying in chair, requires max assist for repositoning/uprighting trunk    Transfers                        Ambulation/Gait                  Stairs            Wheelchair Mobility    Modified Rankin (Stroke Patients Only)       Balance  Pertinent Vitals/Pain Pain Assessment Pain Assessment: Faces Faces Pain Scale: Hurts a little bit Pain Location: legs with mobility Pain Descriptors / Indicators: Guarding Pain Intervention(s): Limited activity within patient's tolerance, Monitored during session, Repositioned    Home Living Family/patient expects to be discharged to:: Private residence Living Arrangements: Alone Available Help at Discharge: Friend(s) Type of Home: House Home Access: Stairs to enter Entrance Stairs-Rails: Psychiatric nurse of Steps: 2-3   Home Layout: One level Home Equipment: Grab bars -  tub/shower;Rolling Environmental consultant (2 wheels) Additional Comments: info from prior admission and friend present    Prior Function Prior Level of Function : Independent/Modified Independent             Mobility Comments: Hydrographic surveyor, drives ADLs Comments: Independent     Hand Dominance        Extremity/Trunk Assessment   Upper Extremity Assessment Upper Extremity Assessment: Generalized weakness    Lower Extremity Assessment Lower Extremity Assessment: Generalized weakness    Cervical / Trunk Assessment Cervical / Trunk Assessment: Kyphotic  Communication      Cognition Arousal/Alertness: Lethargic Behavior During Therapy: Flat affect Overall Cognitive Status: Impaired/Different from baseline                                 General Comments: patient confused and lethargic        General Comments      Exercises     Assessment/Plan    PT Assessment Patient needs continued PT services  PT Problem List Decreased mobility;Decreased strength;Decreased activity tolerance;Decreased balance;Decreased cognition       PT Treatment Interventions DME instruction;Therapeutic exercise;Gait training;Balance training;Manual techniques;Stair training;Neuromuscular re-education;Functional mobility training;Therapeutic activities;Patient/family education    PT Goals (Current goals can be found in the Care Plan section)  Acute Rehab PT Goals Patient Stated Goal: none stated PT Goal Formulation: With patient Time For Goal Achievement: 04/13/22 Potential to Achieve Goals: Fair    Frequency Min 3X/week     Co-evaluation               AM-PAC PT "6 Clicks" Mobility  Outcome Measure Help needed turning from your back to your side while in a flat bed without using bedrails?: A Lot Help needed moving from lying on your back to sitting on the side of a flat bed without using bedrails?: A Lot Help needed moving to and from a bed to a chair (including a  wheelchair)?: A Lot Help needed standing up from a chair using your arms (e.g., wheelchair or bedside chair)?: A Lot Help needed to walk in hospital room?: A Lot Help needed climbing 3-5 steps with a railing? : A Lot 6 Click Score: 12    End of Session   Activity Tolerance: Patient limited by lethargy Patient left: in chair;with chair alarm set;with family/visitor present;with call bell/phone within reach Nurse Communication: Mobility status PT Visit Diagnosis: Unsteadiness on feet (R26.81);Other abnormalities of gait and mobility (R26.89);Muscle weakness (generalized) (M62.81)    Time: 1275-1700 PT Time Calculation (min) (ACUTE ONLY): 13 min   Charges:   PT Evaluation $PT Eval Moderate Complexity: 1 Mod          10:28 AM, 03/30/22 Mearl Latin PT, DPT Physical Therapist at [facility]

## 2022-03-30 NOTE — Progress Notes (Signed)
Progress Note   Patient: [name] GYJ:[id number] DOB: 22-Apr-1924 DOA: 02/27/2022     3 DOS: the patient was seen and examined on 03/30/2022   Brief hospital course: As per H&P written by Dr. Alcario Drought on 03/15/2022 [name] is a 89 y.o. female with medical history significant of HFrEF, PAF on eliquis, SSS s/p PPM.  Pt on PRN O2 at home.   Pt with SOB, productive cough, generalized weakness over past couple of days.  Worsening.  She states she has been the doctor several times since beginning November for similar symptoms and has received several rounds of antibiotics as well as a round of prednisone.  She states she got better but then got worse again over the last few days.  She has not had any fevers or chills, no known sick contacts.   Patient has a history of COPD and CHF. Reports she wears 2 L oxygen at home for prn use. Since last week she has been currently using her oxygen continuously. Denies any chest pain, hemoptysis, N/V/D/C, urinary symptoms.  She endorses periodic symmetric lower extremity swelling that is not worse today, and improves with her Lasix.  Assessment and Plan: * Acute on chronic combined systolic and diastolic CHF (congestive heart failure) (HCC) Continue low-sodium diet, strict intake and output and daily weights. -Continue IV diuresis; based on patient improvement in response will transition to once a day dosage. -Continue to wean oxygen supplementation as tolerated -2D echo demonstrating no significant changes in comparison to prior echo reports with ejection fraction 26-37%, grade 1 diastolic dysfunction and global hypokinesis.   -Continue hydralazine, Imdur and carvedilol -Patient denies chest pain. -Cardiology service will be involved for further recommendation regarding adjustment in patient's medication therapy. -Patient is currently 4.5 pounds negative since admission. -2.45 L negative since admission.  CKD (chronic kidney disease) stage 3, GFR 30-59  ml/min (HCC) -Stable and at baseline -Continue to follow trend with acute diuresis.  Paroxysmal A-fib (HCC) -Continue telemetry monitoring -Continue Eliquis for secondary prevention -Continue carvedilol for rate management.  Benign essential HTN -Stable and well-controlled -Continue current antihypertensive agents.  Hypokalemia -In the setting of diuresis -Potassium 3.5 currently. -Continue to follow trend and further replete as needed.  Hypothyroidism: -Continue Synthroid.  Dysuria/UTI -Urinalysis suggesting the presence of UTI -Patient advised to maintain adequate hydration. -Started empirically on Rocephin -Follow urine culture results.  Weakness/physical deconditioning -Prior to admission was living independently; family concern of his ability to perform activities of daily living in current condition. -Physical therapy evaluation has been requested and TOC has been made aware.  Sundowning/hospital-acquired delirium/metabolic encephalopathy due to UTI. -Minimize sedative agents -Continue low-dose Seroquel at bedtime  -Continue constant reorientation. -Follow urine culture results and treat empirically with Rocephin.  Subjective:  Orbital improved breathing; still using 3-4 L nasal cannula supplementation.  No fever, no nausea, no vomiting, no chest pain.  Per family at bedside mentation not back to baseline.  Patient is weak and deconditioned.  Physical Exam: Vitals:   03/29/22 2003 03/30/22 0435 03/30/22 0500 03/30/22 0820  BP: (!) 122/48 (!) 157/55  (!) 157/49  Pulse: 76 71  69  Resp: '19 18  18  '$ Temp: (!) 96.8 F (36 C) (!) 96.5 F (35.8 C)    TempSrc: Axillary Axillary    SpO2: 98% 95%    Weight:   44.7 kg   Height:       General exam: Alert, awake, oriented x 1; 4 L nasal cannula in place.  Decreased appetite, refusing some meds.  No chest pain, no nausea, no vomiting, no fever. Respiratory system: Improved air movement bilaterally; no wheezing, no  crackles, no using accessory muscle. Cardiovascular system: Rate controlled, no rubs, no gallops, no JVD on exam. Gastrointestinal system: Abdomen is nondistended, soft and nontender. No organomegaly or masses felt. Normal bowel sounds heard. Central nervous system: Alert and oriented. No focal neurological deficits. Extremities: No cyanosis or clubbing; no edema. Skin: No petechiae. Psychiatry: Judgement and insight appear impaired secondary to hospital-acquired delirium.  Data Reviewed: Basic metabolic panel: Sodium 338, potassium 3.5, chloride 87, bicarb 41, glucose 111, BUN 47 and creatinine 1.24 Urinalysis: Positive nitrite and leukocyte esterase; urine culture pending.  2D echo: 1. Left ventricular ejection fraction, by estimation, is 20 to 25%. The  left ventricle has severely decreased function. The left ventricle  demonstrates global hypokinesis. Left ventricular diastolic parameters are  consistent with Grade I diastolic  dysfunction (impaired relaxation).   2. Right ventricular systolic function is normal. The right ventricular  size is normal.   3. Left atrial size was mildly dilated.   4. Right atrial size was mildly dilated.   5. A small pericardial effusion is present.   6. The mitral valve is normal in structure. Trivial mitral valve  regurgitation.   7. The aortic valve is tricuspid. There is mild calcification of the  aortic valve. There is mild thickening of the aortic valve. Aortic valve  regurgitation is not visualized. Aortic valve sclerosis/calcification is  present, without any evidence of  aortic stenosis.   8. The inferior vena cava is dilated in size with >50% respiratory  variability, suggesting right atrial pressure of 8 mmHg.   Family Communication: Family updated over the phone.  Disposition: Status is: Inpatient Remains inpatient appropriate because: Continue IV diuresis.   Planned Discharge Destination: Home  Time spent: 45  minutes  Author: Barton Dubois, MD 03/30/2022 12:46 PM  For on call review [email or web address].

## 2022-03-30 NOTE — Plan of Care (Signed)
  Problem: Acute Rehab PT Goals(only PT should resolve) Goal: Pt Will Go Supine/Side To Sit Outcome: Progressing Flowsheets (Taken 03/30/2022 1029) Pt will go Supine/Side to Sit:  with moderate assist  with minimal assist Goal: Pt Will Go Sit To Supine/Side Outcome: Progressing Flowsheets (Taken 03/30/2022 1029) Pt will go Sit to Supine/Side:  with minimal assist  with moderate assist Goal: Patient Will Transfer Sit To/From Stand Outcome: Progressing Flowsheets (Taken 03/30/2022 1029) Patient will transfer sit to/from stand: with moderate assist Goal: Pt Will Transfer Bed To Chair/Chair To Bed Outcome: Progressing Flowsheets (Taken 03/30/2022 1029) Pt will Transfer Bed to Chair/Chair to Bed: with mod assist Goal: Pt Will Ambulate Outcome: Progressing Flowsheets (Taken 03/30/2022 1029) Pt will Ambulate:  25 feet  with moderate assist  with least restrictive assistive device Goal: Pt/caregiver will Perform Home Exercise Program Outcome: Progressing Flowsheets (Taken 03/30/2022 1029) Pt/caregiver will Perform Home Exercise Program:  For increased strengthening  For improved balance  With Supervision, verbal cues required/provided   10:30 AM, 03/30/22 Mearl Latin PT, DPT Physical Therapist at [facility]

## 2022-03-31 ENCOUNTER — Inpatient Hospital Stay (HOSPITAL_COMMUNITY): Payer: Medicare Other

## 2022-03-31 DIAGNOSIS — Z515 Encounter for palliative care: Secondary | ICD-10-CM

## 2022-03-31 DIAGNOSIS — G9341 Metabolic encephalopathy: Secondary | ICD-10-CM

## 2022-03-31 DIAGNOSIS — Z7189 Other specified counseling: Secondary | ICD-10-CM

## 2022-03-31 DIAGNOSIS — N390 Urinary tract infection, site not specified: Secondary | ICD-10-CM

## 2022-03-31 LAB — BASIC METABOLIC PANEL
Anion gap: 15 (ref 5–15)
BUN: 59 mg/dL — ABNORMAL HIGH (ref 8–23)
CO2: 39 mmol/L — ABNORMAL HIGH (ref 22–32)
Calcium: 9.6 mg/dL (ref 8.9–10.3)
Chloride: 87 mmol/L — ABNORMAL LOW (ref 98–111)
Creatinine, Ser: 1.5 mg/dL — ABNORMAL HIGH (ref 0.44–1.00)
GFR, Estimated: 32 mL/min — ABNORMAL LOW (ref >60)
Glucose, Bld: 85 mg/dL (ref 70–99)
Potassium: 3.8 mmol/L (ref 3.5–5.1)
Sodium: 141 mmol/L (ref 135–145)

## 2022-03-31 LAB — TROPONIN I (HIGH SENSITIVITY): Troponin I (High Sensitivity): 66 ng/L — ABNORMAL HIGH (ref <18)

## 2022-03-31 MED ORDER — SCOPOLAMINE 1 MG/3DAYS TD PT72
1.0000 | MEDICATED_PATCH | TRANSDERMAL | Status: DC
  Administered 2022-03-31 – 2022-04-03 (×2): 1.5 mg via TRANSDERMAL
  Filled 2022-03-31 (×2): qty 1

## 2022-03-31 MED ORDER — GUAIFENESIN ER 600 MG PO TB12
600.0000 mg | ORAL_TABLET | Freq: Two times a day (BID) | ORAL | Status: DC
  Filled 2022-03-31: qty 1

## 2022-03-31 MED ORDER — SODIUM CHLORIDE 3 % IN NEBU
4.0000 mL | INHALATION_SOLUTION | Freq: Every day | RESPIRATORY_TRACT | Status: AC
  Administered 2022-03-31 – 2022-04-01 (×3): 4 mL via RESPIRATORY_TRACT
  Filled 2022-03-31 (×3): qty 4

## 2022-03-31 MED ORDER — BUDESONIDE 0.5 MG/2ML IN SUSP
0.5000 mg | Freq: Two times a day (BID) | RESPIRATORY_TRACT | Status: DC
  Administered 2022-03-31 – 2022-04-04 (×8): 0.5 mg via RESPIRATORY_TRACT
  Filled 2022-03-31 (×8): qty 2

## 2022-03-31 NOTE — Progress Notes (Addendum)
This nurse was rounding on patients and noticed that this patient was in respiratory distress. Asked Einar Pheasant, RN (charge nurse for unit) to come to patients bedside. VS obtained. RN placed patient on NRB. Notified Dr. Clearence Ped and consulted respiratory. Respiratory came to bedside placed patient on HFNC 15L and then back on NRB at 15L. Respiratory administered PRN albuterol neb, and suctioned patient. EKG completed, CXR completed. Yellow mews triggered and yellow mews protocol initiated.    03/31/22 0337  Assess: MEWS Score  Temp (!) 97.5 F (36.4 C)  BP (!) 142/62  MAP (mmHg) 81  Pulse Rate 79  Resp (!) 28  SpO2 97 %  O2 Device Non-rebreather Mask  O2 Flow Rate (L/min) 15 L/min  Assess: MEWS Score  MEWS Temp 0  MEWS Systolic 0  MEWS Pulse 0  MEWS RR 2  MEWS LOC 0  MEWS Score 2  MEWS Score Color Yellow  Assess: if the MEWS score is Yellow or Red  Were vital signs taken at a resting state? Yes  Focused Assessment Change from prior assessment (see assessment flowsheet)  Does the patient meet 2 or more of the SIRS criteria? Yes  Does the patient have a confirmed or suspected source of infection? No  MEWS guidelines implemented *See Row Information* Yes  Treat  MEWS Interventions Administered prn meds/treatments;Consulted Respiratory Therapy  Pain Scale Faces  Pain Score 4  Take Vital Signs  Increase Vital Sign Frequency  Yellow: Q 2hr X 2 then Q 4hr X 2, if remains yellow, continue Q 4hrs  Escalate  MEWS: Escalate Yellow: discuss with charge nurse/RN and consider discussing with provider and RRT  Notify: Charge Nurse/RN  Name of Charge Nurse/RN Notified El [place], RN  Date Charge Nurse/RN Notified 03/31/22  Time Charge Nurse/RN Notified [phone]  Provider Notification  Provider Name/Title Dr. Clearence Ped  Date Provider Notified 03/31/22  Time Provider Notified [phone]  Method of Notification Page  Notification Reason Change in status  Provider response See new orders  Date of  Provider Response 03/31/22  Time of Provider Response [phone]  Document  Patient Outcome Other (Comment) (remains on unit)  Progress note created (see row info) Yes  Assess: SIRS CRITERIA  SIRS Temperature  0  SIRS Pulse 0  SIRS Respirations  1  SIRS WBC 0  SIRS Score Sum  1

## 2022-03-31 NOTE — Progress Notes (Signed)
Has been drowsy today. Opens eyes to voice but not communicating with staff or visitors.  Unable to give po meds or to be fed.  On 15 liters and saturations in mid to upper 90's

## 2022-03-31 NOTE — Progress Notes (Signed)
Progress Note   Patient: [name] OZH:[id number] DOB: 10-23-1924 DOA: 03/07/2022     4 DOS: the patient was seen and examined on 03/31/2022   Brief hospital course: As per H&P written by Dr. Alcario Drought on 03/03/2022 [name] is a 89 y.o. female with medical history significant of HFrEF, PAF on eliquis, SSS s/p PPM.  Pt on PRN O2 at home.   Pt with SOB, productive cough, generalized weakness over past couple of days.  Worsening.  She states she has been the doctor several times since beginning November for similar symptoms and has received several rounds of antibiotics as well as a round of prednisone.  She states she got better but then got worse again over the last few days.  She has not had any fevers or chills, no known sick contacts.   Patient has a history of COPD and CHF. Reports she wears 2 L oxygen at home for prn use. Since last week she has been currently using her oxygen continuously. Denies any chest pain, hemoptysis, N/V/D/C, urinary symptoms.  She endorses periodic symmetric lower extremity swelling that is not worse today, and improves with her Lasix.  Assessment and Plan: * Acute on chronic combined systolic and diastolic CHF (congestive heart failure) (HCC) Continue low-sodium diet, strict intake and output and daily weights. -Continue IV diuresis; based on patient improvement in response will transition to once a day dosage (using only '20mg'$  daily). -Continue to wean oxygen supplementation as tolerated -2D echo demonstrating no significant changes in comparison to prior echo reports with ejection fraction 62-95%, grade 1 diastolic dysfunction and global hypokinesis.   -Continue hydralazine, Imdur and carvedilol -Patient denies chest pain. -Cardiology service will be involved for further recommendation regarding adjustment in patient's medication therapy. -Patient is currently 4.5 pounds negative since admission. -2.45 L negative since admission.  CKD (chronic kidney  disease) stage 3, GFR 30-59 ml/min (HCC) -Stable and at baseline -Continue to follow trend with acute diuresis.  Paroxysmal A-fib (HCC) -Continue telemetry monitoring -Continue Eliquis for secondary prevention -Continue carvedilol for rate management.  Benign essential HTN -Stable and well-controlled -Continue current antihypertensive agents.  Hypokalemia -In the setting of diuresis -Potassium 3.8 currently. -Continue to follow trend and further replete as needed.  Hypothyroidism: -Continue Synthroid.  Dysuria/UTI -Urinalysis suggesting the presence of UTI -Patient advised to maintain adequate hydration. -Started empirically on Rocephin -continue to follow urine culture results.  Weakness/physical deconditioning -Prior to admission was living independently; family concern of his ability to perform activities of daily living in current condition. -Physical therapy evaluation has been requested and TOC has been made aware.  Sundowning/hospital-acquired delirium/metabolic encephalopathy due to UTI. -Minimize sedative agents -Continue low-dose Seroquel at bedtime  -Continue constant reorientation. -Follow urine culture results and continue tx empirically with Rocephin. -in the setting of decline and not eating palliative service consulted.  Subjective:  Overnight with decompensated breathing requiring up to 15L [place] and transient use of NRB mask. No fever, CXR reassuring and with troponin of 65 (trending down from 300 range). No CP, no nausea, no vomiting. Patient is not eating or drinking and appears very weak.deconditioned and somnolent currently.  Physical Exam: Vitals:   03/31/22 0553 03/31/22 0619 03/31/22 0757 03/31/22 0928  BP: (!) 143/79   (!) 120/46  Pulse: 95   83  Resp: 18   20  Temp: 98.1 F (36.7 C)   98.4 F (36.9 C)  TempSrc:    Oral  SpO2: 95%  100% 96%  Weight:  41.6 kg    Height:       General exam: somnolent, no CP, no nausea, no vomiting. Extremely  tired and weak on examination. No fever. Per family patient is not eating, not drinking and refusing meds. Respiratory system: positive rhonchi bilaterally, no frank wheezing, no crackles. Mouth breathing appreciated. 10L [place] supplementation in place. Cardiovascular system:Rate controlled, no rubs, no gallops. Gastrointestinal system: Abdomen is nondistended, soft and nontender. No organomegaly or masses felt. Normal bowel sounds heard. Central nervous system: No focal neurological deficits. Extremities: No Cyanosis, no Clubbing, no edema. Skin: No petechiae. Psychiatry: Judgement and insight impaired secondary to acute encephalopathy/delirium.  Data Reviewed: Basic metabolic panel: Sodium 845, potassium 3.8, chloride 87, bicarb 39, BUN59 and Cr 1.50 Troponin: 66  2D echo: 1. Left ventricular ejection fraction, by estimation, is 20 to 25%. The  left ventricle has severely decreased function. The left ventricle  demonstrates global hypokinesis. Left ventricular diastolic parameters are  consistent with Grade I diastolic  dysfunction (impaired relaxation).   2. Right ventricular systolic function is normal. The right ventricular  size is normal.   3. Left atrial size was mildly dilated.   4. Right atrial size was mildly dilated.   5. A small pericardial effusion is present.   6. The mitral valve is normal in structure. Trivial mitral valve  regurgitation.   7. The aortic valve is tricuspid. There is mild calcification of the  aortic valve. There is mild thickening of the aortic valve. Aortic valve  regurgitation is not visualized. Aortic valve sclerosis/calcification is  present, without any evidence of  aortic stenosis.   8. The inferior vena cava is dilated in size with >50% respiratory  variability, suggesting right atrial pressure of 8 mmHg.   Family Communication: Family updated over the phone.  Disposition: Status is: Inpatient Remains inpatient appropriate because: Continue  IV diuresis.   Planned Discharge Destination: Home  Time spent: 45 minutes  Author: Barton Dubois, MD 03/31/2022 11:03 AM  For on call review [email or web address].

## 2022-03-31 NOTE — Progress Notes (Signed)
Called to patient room for desat episode and RN felt that patient would benefit from breathing treatment due to adventitious BS.  Auscultation did reveal some inspiratory wheeze and Rhonchi/Coarse sounds.  RN had placed patient on NRB and sat was 100%.  Tried [place] HFNC, but patient breathing through mouth and did not tolerate.  Placed patient back on NRB; sat fluctuating between 96% to 98%, and sometimes slipping down into lower 90s.

## 2022-03-31 NOTE — Progress Notes (Signed)
Left message on voicemail for Dr. Corey Skains [facility]) to return call for update.

## 2022-03-31 NOTE — Progress Notes (Signed)
Paged for hypoxia. EKG ordered - paced. Troponin and stat CXR ordered. Patient was satting 86% on 15L NRB per report.

## 2022-03-31 NOTE — Consult Note (Signed)
Consultation Note Date: 03/31/2022   Patient Name: [name]  DOB: 24-Feb-1925  MRN: [id number]  Age / Sex: 89 y.o., female  PCP: [name] Sites, MD Referring Physician: Barton Dubois, MD  Reason for Consultation: Establishing goals of care  HPI/Patient Profile: 89 y.o. female  with past medical history of HFrEF EF 20-25% with G1DD, chronic 2L oxygen PRN at home, PAF on Eliquis, SSS s/p pacemaker, CAD, hypertension, CKD stage 3, hypothyroidism, GERD admitted on 03/01/2022 with shortness of breath, BLE edema related to .   Clinical Assessment and Goals of Care: I met today at [name]'s ("Pete's") bedside after extensive chart review. She is somnolent and non-verbal at current time - unable to participate in conversation. [name], and his wife are at bedside. [name] significant other is also at bedside. I had conversation with [name] who shares that his father used to be HCPOA but since he has passed his brother, [name], is [name]. [name] and [name] are next of kin and both assist with [name]'s care. [name] reports that she has lived very well independently but has had increasing illness and difficulties over the past 1-2 months. She has been a widow for many decades and had no children of her own. HCPOA [name] is currently vacationing on cruise in the [place] and unreachable - in his absence [name] is next of kin and [name].   We discussed that [name] is seriously ill and we are concerned as she continues to decline despite aggressive care and some signs of improvement in her fluid status. We discussed the burdens of severe heart failure and delirium with UTI and hospitalization. Unfortunately I worry that this may be too much for [name] and she is high risk for further decline and end of life. DNR is confirmed. We discussed the risks vs benefits of pursuing more invasive/aggressive measures and  determine this would not be in her best interest as this makes it less likely that she will be able to improve to an acceptable quality of life (she would not be interested in ALF/SNF placement and wants to be at home per family). We discussed the risks of artificial feeding with concern for refeeding syndrome, causing nausea/vomiting and aspiration/pneumonia and determined that the risks outweigh the benefits. I explained that we will take our lead from [name] as her body will let us know what we need to do - either she will show signs of improvement or further decline indicating we should consider comfort focused care. [name] and his wife understand. They agree that we can share and gently explain to significant other, [name], but he is not the decision maker.   All questions/concerns addressed. Emotional support provided.   Primary Decision Maker NEXT OF KIN nephew [name] in the absence of HCPOA [name] who is not available at this time    SUMMARY OF RECOMMENDATIONS   - DNR per patient's previous stated wishes - Continue current level of care  - No plans to escalate care to more  aggressive/invasive interventions or ICU level care - Family open to comfort measures if she declines further  Code Status/Advance Care Planning: DNR   Symptom Management:  Per attending.   Prognosis:  Overall prognosis poor with severity of acute illness and underlying severe CHF and advanced age.   Discharge Planning: To Be Determined      Primary Diagnoses: Present on Admission:  Acute on chronic combined systolic and diastolic CHF (congestive heart failure) (HCC)  Benign essential HTN  CKD (chronic kidney disease) stage 3, GFR 30-59 ml/min (HCC)  Paroxysmal A-fib ([place])   I have reviewed the medical record, interviewed the patient and family, and examined the patient. The following aspects are pertinent.  Past Medical History:  Diagnosis Date   Cardiomyopathy ([place])    CHF (congestive heart failure)  (HCC)    Coronary artery disease    GERD (gastroesophageal reflux disease)    History of sick sinus syndrome    Hypertension    Hypothyroidism    Paroxysmal atrial fibrillation (HCC)    Shortness of breath dyspnea    Thyroid disease    Social History   Socioeconomic History   Marital status: Widowed    Spouse name: Not on file   Number of children: Not on file   Years of education: Not on file   Highest education level: Not on file  Occupational History   Not on file  Tobacco Use   Smoking status: Former    Packs/day: 0.25    Years: 15.00    Total pack years: 3.75    Types: Cigarettes    Quit date: 08/13/1970    Years since quitting: 51.6   Smokeless tobacco: Never  Vaping Use   Vaping Use: Never used  Substance and Sexual Activity   Alcohol use: No    Alcohol/week: 0.0 standard drinks of alcohol   Drug use: No   Sexual activity: Never    Birth control/protection: Post-menopausal  Other Topics Concern   Not on file  Social History Narrative   Not on file   Social Determinants of Health   Financial Resource Strain: Not on file  Food Insecurity: No Food Insecurity (03/24/2022)   Hunger Vital Sign    Worried About Running Out of Food in the Last Year: Never true    [place] in the Last Year: Never true  Transportation Needs: No Transportation Needs (03/14/2022)   PRAPARE - Hydrologist (Medical): No    Lack of Transportation (Non-Medical): No  Physical Activity: Not on file  Stress: Not on file  Social Connections: Not on file   Family History  Problem Relation Age of Onset   Heart disease Other    Cancer Other    Kidney disease Other    Scheduled Meds:  apixaban  2.5 mg Oral BID   budesonide (PULMICORT) nebulizer solution  0.5 mg Nebulization BID   carvedilol  6.25 mg Oral BID WC   feeding supplement  120 mL Oral BID BM   furosemide  20 mg Intravenous Daily   guaiFENesin  600 mg Oral BID   hydrALAZINE  100 mg Oral  BID   ipratropium  1 spray Each Nare BID   isosorbide mononitrate  30 mg Oral Daily   levothyroxine  112 mcg Oral Daily   loratadine  10 mg Oral q morning   melatonin  3 mg Oral QHS   polyvinyl alcohol  1 drop Both Eyes TID   QUEtiapine  12.5 mg Oral QHS   scopolamine  1 patch Transdermal Q72H   sodium chloride flush  3 mL Intravenous Q12H   sodium chloride HYPERTONIC  4 mL Nebulization Daily   Continuous Infusions:  sodium chloride     cefTRIAXone (ROCEPHIN)  IV 1 g (03/31/22 1010)   PRN Meds:.sodium chloride, acetaminophen, albuterol, ondansetron (ZOFRAN) IV, sodium chloride flush Allergies  Allergen Reactions   Macrodantin Shortness Of Breath   Sulfa Antibiotics Shortness Of Breath   Review of Systems  Unable to perform ROS: Acuity of condition    Physical Exam Vitals and nursing note reviewed.  Constitutional:      General: She is sleeping. She is not in acute distress.    Appearance: She is cachectic. She is ill-appearing.  Cardiovascular:     Rate and Rhythm: Normal rate.  Pulmonary:     Effort: No tachypnea, accessory muscle usage or respiratory distress.     Comments: 15L [place] Abdominal:     General: Abdomen is flat.  Neurological:     Comments: Somnolent Not following commands Opens eyes spontaneously occasionally but no tracking     Vital Signs: BP (!) 120/46 (BP Location: Left Arm)   Pulse 83   Temp 98.4 F (36.9 C) (Oral)   Resp 20   Ht _0  (1.626 m)   Wt 41.6 kg   SpO2 96%   BMI 15.74 kg/m  Pain Scale: Faces   Pain Score: 0-No pain   SpO2: SpO2: 96 % O2 Device:SpO2: 96 % O2 Flow Rate: .O2 Flow Rate (L/min): 10 L/min  IO: Intake/output summary:  Intake/Output Summary (Last 24 hours) at 03/31/2022 1147 Last data filed at 03/31/2022 0300 Gross per 24 hour  Intake 103.16 ml  Output --  Net 103.16 ml    LBM:   Baseline Weight: Weight: 43.7 kg Most recent weight: Weight: 41.6 kg     Palliative Assessment/Data:     Time In:  1215  Time Total: 80 min  Greater than 50%  of this time was spent counseling and coordinating care related to the above assessment and plan.  Signed by: Vinie Sill, NP Palliative Medicine Team Pager # [phone] (M-F 8a-5p) Team Phone # [phone] (Nights/Weekends)

## 2022-03-31 NOTE — Progress Notes (Signed)
Has not voided.  Scan showed 515.  In and out yielded 900 amber urine.

## 2022-04-01 DIAGNOSIS — Z7189 Other specified counseling: Secondary | ICD-10-CM

## 2022-04-01 DIAGNOSIS — N390 Urinary tract infection, site not specified: Secondary | ICD-10-CM

## 2022-04-01 DIAGNOSIS — G9341 Metabolic encephalopathy: Secondary | ICD-10-CM

## 2022-04-01 DIAGNOSIS — Z515 Encounter for palliative care: Secondary | ICD-10-CM

## 2022-04-01 LAB — URINE CULTURE: Culture: >=100000 — AB

## 2022-04-01 MED ORDER — DEXTROSE 5 % IV SOLN
500.0000 mg | INTRAVENOUS | Status: AC
  Administered 2022-04-01 – 2022-04-03 (×3): 500 mg via INTRAVENOUS
  Filled 2022-04-01 (×3): qty 5

## 2022-04-01 MED ORDER — CHLORHEXIDINE GLUCONATE CLOTH 2 % EX PADS
6.0000 | MEDICATED_PAD | Freq: Every day | CUTANEOUS | Status: DC
  Administered 2022-04-01 – 2022-04-03 (×3): 6 via TOPICAL

## 2022-04-01 NOTE — Care Management Important Message (Signed)
Important Message  Patient Details  Name: [name] MRN: [id number] Date of Birth: [birth year]   Medicare Important Message Given:  Yes     Tommy Medal 04/01/2022, 11:58 AM

## 2022-04-01 NOTE — Progress Notes (Signed)
Reduced patient to Venti mask 55 at 15 liter. Removed NRB mask , patient appears to be retaining PCO2.

## 2022-04-01 NOTE — Progress Notes (Signed)
Palliative:  HPI: 89 y.o. female  with past medical history of HFrEF EF 20-25% with G1DD, chronic 2L oxygen PRN at home, PAF on Eliquis, SSS s/p pacemaker, CAD, hypertension, CKD stage 3, hypothyroidism, GERD admitted on 02/26/2022 with shortness of breath, BLE edema related to CHF exacerbation.   I met again at [name]'s bedside with next-of-kin decision maker nephew [name]. We discussed that there are no changes in her condition today. [name] understands and accepts that her prognosis is poor. He understands that she would not desire a decreased quality of life either. He continues to hope that she will awaken one day back to herself as she did during a hospital admission ~1 year ago but he is accepting if this does not happen. He continues to follow along with medical recommendations for next steps. IVF were brought up and we discussed the risks and benefits and I suggested that sometimes in these situations the less we do to [name] the better. I suggest that if she declines further that we consider transition to comfort care. Continue watchful waiting at this time.   All questions/concerns addressed. Emotional support provided.   Exam: Opens eyes spontaneously but no tracking. No following commands. Thin, frail. Breathing regular, unlabored on 15L. Abd flat. Warm to touch.   Plan: - Watchful waiting.  - DNR.  - Avoid escalation to invasive/aggressive measures.  - Consideration of transition to comfort measures if further decline or lack of improvement.   [place], NP Palliative Medicine Team Pager [phone] (Please see [email or web address] for schedule) Team Phone [phone]    Greater than 50%  of this time was spent counseling and coordinating care related to the above assessment and plan

## 2022-04-01 NOTE — Progress Notes (Signed)
Patient rested well during the night. VS remained stable during the night. She is still drowsy and all medications are held due to patient being drowsy. Patient did not void during the 7P-7A shift. Bladder scan completed at 0340 and it showed 128 mL. Patient remains on 15L high flow nasal cannula.

## 2022-04-01 NOTE — Progress Notes (Signed)
Family came to desk and spoke with this nurse saying he noticed patient's oxygen level on monitor was in the 80's. This nurse entered room to find patient asleep with high flow cannula on 15L ad O2 sat 86%. Patient easily aroused but O2 sat remained the same. RT aware and advised this nurse to place patient on NRB. After applying NRB, O2 sat increased to 98-100%. Foley inserted per order and CHG bath given. No family currently at bedside, call bell within reach.

## 2022-04-01 NOTE — Progress Notes (Signed)
Patient unable to safely take morning medications. Patient alert but non-verbal at this time and unable to follow commands. Family at bedside reports attempting to give drink of water but patient could not drink from straw. Advised family of risk of aspiration, verbalized understanding. Patient has had decreased urine output, bladder scan revealed 235m. Dr. MDyann Kiefmade aware. Call bell in reach, family remains at bedside.

## 2022-04-01 NOTE — Progress Notes (Signed)
Progress Note   Patient: [name] EXB:[id number] DOB: [birth year] DOA: 02/28/2022     5 DOS: the patient was seen and examined on 04/01/2022   Brief hospital course: As per H&P written by Dr. Alcario Drought on 02/28/2022 [name] is a 89 y.o. female with medical history significant of HFrEF, PAF on eliquis, SSS s/p PPM.  Pt on PRN O2 at home.   Pt with SOB, productive cough, generalized weakness over past couple of days.  Worsening.  She states she has been the doctor several times since beginning November for similar symptoms and has received several rounds of antibiotics as well as a round of prednisone.  She states she got better but then got worse again over the last few days.  She has not had any fevers or chills, no known sick contacts.   Patient has a history of COPD and CHF. Reports she wears 2 L oxygen at home for prn use. Since last week she has been currently using her oxygen continuously. Denies any chest pain, hemoptysis, N/V/D/C, urinary symptoms.  She endorses periodic symmetric lower extremity swelling that is not worse today, and improves with her Lasix.  Assessment and Plan: * Acute on chronic combined systolic and diastolic CHF (congestive heart failure) (HCC) Continue low-sodium diet, strict intake and output and daily weights. -Continue IV diuresis; based on patient improvement in response will transition to once a day dosage (using only '20mg'$  daily). -Continue to wean oxygen supplementation as tolerated -2D echo demonstrating no significant changes in comparison to prior echo reports with ejection fraction 10-27%, grade 1 diastolic dysfunction and global hypokinesis.   -Continue hydralazine, Imdur and carvedilol -Patient denies chest pain. -Cardiology service will be involved for further recommendation regarding adjustment in patient's medication therapy. -Patient is currently 4.5 pounds negative since admission. -2.45 L negative since admission.  CKD (chronic kidney  disease) stage 3, GFR 30-59 ml/min (HCC) -Stable and at baseline -Continue to follow trend with acute diuresis.  Paroxysmal A-fib (HCC) -Continue telemetry monitoring -Continue Eliquis for secondary prevention -Continue carvedilol for rate management.  Benign essential HTN -Stable and well-controlled -Continue current antihypertensive agents.  Hypokalemia -In the setting of diuresis -Potassium 3.8 currently. -Continue to follow trend and further replete as needed.  Hypothyroidism: -Continue Synthroid when able to take p.o.'s.Marland Kitchen  Dysuria/UTI -Urinalysis suggesting the presence of UTI -Patient advised to maintain adequate hydration. -Started empirically on Rocephin; following culture results antibiotic has been adjusted to the use of cefepime. -continue to follow response.  Weakness/physical deconditioning -Prior to admission was living independently; family concern of his ability to perform activities of daily living in current condition. -Physical therapy evaluation has been requested and TOC has been made aware.  Sundowning/hospital-acquired delirium/metabolic encephalopathy due to UTI. -Minimize sedative agents -Continue low-dose Seroquel at bedtime  -Continue constant reorientation. -Following urine cultures, Enterobacter microorganism appreciated; antibiotics adjusted for better coverage using cefepime. -in the setting of decline and not eating elective care service was consulted; goals of care discussed and decision made not to further escalate treatment and to allow time for improvement.  No artificial feedings, no intubation, no resuscitation.  Subjective:  Somnolent, disoriented, no eating, not drinking and requiring 15 L nasal cannula supplementation.  Patient had demonstrating 2 different occasions urinary retention and has required the need for Foley catheter placement.  Physical Exam: Vitals:   04/01/22 0806 04/01/22 0810 04/01/22 1420 04/01/22 1453  BP:   (!)  127/43   Pulse:   70   Resp:  17   Temp:   97.7 F (36.5 C)   TempSrc:   Axillary   SpO2: 91% 92% (!) 86% 100%  Weight:      Height:       General exam: Somnolent, disoriented and having difficulties following commands.  Afebrile.  Good saturation appreciated on 15 L nasal cannula supplementation.  Patient breathing through her mouth. Respiratory system: No frank crackles; no using accessory muscle.  No tachypnea. Cardiovascular system: Rate controlled, no rubs, no gallops, no JVD on exam. Gastrointestinal system: Abdomen is nondistended, soft and nontender. No organomegaly or masses felt. Normal bowel sounds heard. Central nervous system: Limited examination with current encephalopathy; able to move 4 limbs spontaneously. Extremities: No cyanosis, clubbing or edema. Skin: No petechiae, no open wounds. Psychiatry: Judgement and insight appear impaired secondary to acute encephalopathic process.  Data Reviewed: Basic metabolic panel: Sodium 615, potassium 3.8, chloride 87, bicarb 39, BUN59 and Cr 1.50 Troponin: 66  2D echo: 1. Left ventricular ejection fraction, by estimation, is 20 to 25%. The  left ventricle has severely decreased function. The left ventricle  demonstrates global hypokinesis. Left ventricular diastolic parameters are  consistent with Grade I diastolic  dysfunction (impaired relaxation).   2. Right ventricular systolic function is normal. The right ventricular  size is normal.   3. Left atrial size was mildly dilated.   4. Right atrial size was mildly dilated.   5. A small pericardial effusion is present.   6. The mitral valve is normal in structure. Trivial mitral valve  regurgitation.   7. The aortic valve is tricuspid. There is mild calcification of the  aortic valve. There is mild thickening of the aortic valve. Aortic valve  regurgitation is not visualized. Aortic valve sclerosis/calcification is  present, without any evidence of  aortic stenosis.   8.  The inferior vena cava is dilated in size with >50% respiratory  variability, suggesting right atrial pressure of 8 mmHg.   Family Communication: Family updated over the phone.  Disposition: Status is: Inpatient Remains inpatient appropriate because: Continue IV diuresis.   Planned Discharge Destination: Home  Time spent: 45 minutes  Author: Barton Dubois, MD 04/01/2022 3:52 PM  For on call review [email or web address].

## 2022-04-02 ENCOUNTER — Encounter (HOSPITAL_COMMUNITY): Payer: Self-pay | Admitting: Internal Medicine

## 2022-04-02 DIAGNOSIS — Z515 Encounter for palliative care: Secondary | ICD-10-CM

## 2022-04-02 DIAGNOSIS — Z7189 Other specified counseling: Secondary | ICD-10-CM

## 2022-04-02 LAB — MRSA NEXT GEN BY PCR, NASAL: MRSA by PCR Next Gen: NOT DETECTED

## 2022-04-02 LAB — BLOOD GAS, ARTERIAL
Acid-Base Excess: 25.5 mmol/L — ABNORMAL HIGH (ref 0.0–2.0)
Bicarbonate: 55.8 mmol/L — ABNORMAL HIGH (ref 20.0–28.0)
Drawn by: 27016
FIO2: 55 %
O2 Saturation: 92.2 %
Patient temperature: 37
pCO2 arterial: 86 mmHg (ref 32–48)
pH, Arterial: 7.42 (ref 7.35–7.45)
pO2, Arterial: 60 mmHg — ABNORMAL LOW (ref 83–108)

## 2022-04-02 MED ORDER — HALOPERIDOL LACTATE 5 MG/ML IJ SOLN: 0.5000 mg | Freq: Three times a day (TID) | INTRAMUSCULAR | Status: DC | PRN

## 2022-04-02 MED ORDER — SODIUM CHLORIDE 0.9 % IV SOLN: INTRAVENOUS | Status: AC

## 2022-04-02 NOTE — Progress Notes (Signed)
Progress Note   Patient: [name] AVW:[id number] DOB: 07/26/1924 DOA: 03/12/2022     6 DOS: the patient was seen and examined on 04/02/2022   Brief hospital course: As per H&P written by Dr. Alcario Drought on 03/04/2022 [name] is a 89 y.o. female with medical history significant of HFrEF, PAF on eliquis, SSS s/p PPM.  Pt on PRN O2 at home.   Pt with SOB, productive cough, generalized weakness over past couple of days.  Worsening.  She states she has been the doctor several times since beginning November for similar symptoms and has received several rounds of antibiotics as well as a round of prednisone.  She states she got better but then got worse again over the last few days.  She has not had any fevers or chills, no known sick contacts.   Patient has a history of COPD and CHF. Reports she wears 2 L oxygen at home for prn use. Since last week she has been currently using her oxygen continuously. Denies any chest pain, hemoptysis, N/V/D/C, urinary symptoms.  She endorses periodic symmetric lower extremity swelling that is not worse today, and improves with her Lasix.  Assessment and Plan: * Acute on chronic combined systolic and diastolic CHF (congestive heart failure) (HCC) Continue low-sodium diet, strict intake and output and daily weights. -Continue IV diuresis; based on patient improvement in response will transition to once a day dosage (using only '20mg'$  daily). -Continue to wean oxygen supplementation as tolerated -2D echo demonstrating no significant changes in comparison to prior echo reports with ejection fraction 82-95%, grade 1 diastolic dysfunction and global hypokinesis.   -Continue hydralazine, Imdur and carvedilol -Patient denies chest pain. -Cardiology service will be involved for further recommendation regarding adjustment in patient's medication therapy. -Patient is currently 4.5 pounds negative since admission. - approximately 3 L negative since admission.  CKD  (chronic kidney disease) stage 3, GFR 30-59 ml/min (HCC) -Overall appears stable and at her baseline -Continue to follow trend intermittently while receiving diuresis.  Paroxysmal A-fib (HCC) -Continue telemetry monitoring -Continue Eliquis for secondary prevention -Continue carvedilol for rate management. -If patient able to take p.o.'s medications.  Benign essential HTN -Stable and well-controlled -Continue current antihypertensive agents if able to take by mouth. -As needed IV antihypertensive agents can be provided if required.  Hypokalemia -In the setting of diuresis. -latest Potassium 3.8; continue to follow electrolytes intermittently.  -further replete as needed.  Hypothyroidism: -Continue Synthroid when able to take p.o.'s.Marland Kitchen  Dysuria/UTI -Urinalysis suggesting the presence of UTI -Patient advised to maintain adequate hydration. -Started empirically on Rocephin; following culture results antibiotic has been adjusted to the use of cefepime. -continue to follow response.  Weakness/physical deconditioning -Prior to admission was living independently; family concern of his ability to perform activities of daily living in current condition. -Physical therapy evaluation has been requested and TOC has been made aware.  Sundowning/hospital-acquired delirium/metabolic encephalopathy due to UTI. -Minimize sedative agents -Continue low-dose Seroquel at bedtime  -Continue constant reorientation. -Following urine cultures, Enterobacter microorganism isolated; antibiotics adjusted for better coverage using cefepime. -in the setting of decline and not eating elective care service was consulted; goals of care discussed and decision made not to further escalate treatment and to allow time for improvement.  No artificial feedings, no intubation, no resuscitation. -Concern for potential CO2 retention has been brought up; ABG will be done to assess for abnormalities.  Subjective:   Patient mentation still not back to baseline; continue to require high levels of oxygen  supplementation especially at nighttime, no chest pain, no nausea, no vomiting, no fever.  She is no eating, not drinking at this moment demonstrating difficulty following commands and taking oral medications.  Physical Exam: Vitals:   04/01/22 2014 04/02/22 0458 04/02/22 0722 04/02/22 1418  BP:  (!) 154/46  (!) 156/44  Pulse: 74 70  75  Resp: '18 18  17  '$ Temp:  (!) 97.5 F (36.4 C)  98.1 F (36.7 C)  TempSrc:  Oral  Oral  SpO2: 100% 91% 94% 92%  Weight:  41.1 kg    Height:       General exam: Somnolent, confused and disoriented; demonstrating difficulty to follow simple commands, no eating, not drinking and not taking oral meds currently.  Patient is afebrile, no nausea, no vomiting, no chest pain.   Respiratory system: Positive intermittent tachypnea has been described; no using accessory muscles.  No wheezing or crackles appreciated on exam. Cardiovascular system: Rate controlled, no rubs, no gallops, no JVD on exam. Gastrointestinal system: Abdomen is nondistended, soft and nontender. No organomegaly or masses felt. Normal bowel sounds heard. Central nervous system: Unable to properly assess secondary to ongoing acute encephalopathy; moving 4 limbs spontaneously. Extremities: No cyanosis, clubbing or edema. Skin: No petechiae. Psychiatry: Judgement and insight appear impaired secondary to underlying encephalopathy.  Data Reviewed: Basic metabolic panel: Sodium 239, potassium 3.8, chloride 87, bicarb 39, BUN59 and Cr 1.50 Troponin: 66  2D echo: 1. Left ventricular ejection fraction, by estimation, is 20 to 25%. The  left ventricle has severely decreased function. The left ventricle  demonstrates global hypokinesis. Left ventricular diastolic parameters are  consistent with Grade I diastolic  dysfunction (impaired relaxation).   2. Right ventricular systolic function is normal. The right  ventricular  size is normal.   3. Left atrial size was mildly dilated.   4. Right atrial size was mildly dilated.   5. A small pericardial effusion is present.   6. The mitral valve is normal in structure. Trivial mitral valve  regurgitation.   7. The aortic valve is tricuspid. There is mild calcification of the  aortic valve. There is mild thickening of the aortic valve. Aortic valve  regurgitation is not visualized. Aortic valve sclerosis/calcification is  present, without any evidence of  aortic stenosis.   8. The inferior vena cava is dilated in size with >50% respiratory  variability, suggesting right atrial pressure of 8 mmHg.   Family Communication: Family updated over the phone.  Disposition: Status is: Inpatient Remains inpatient appropriate because: Continue IV diuresis.   Planned Discharge Destination: Home  Time spent: 45 minutes  Author: Barton Dubois, MD 04/02/2022 4:43 PM  For on call review [email or web address].

## 2022-04-02 NOTE — Progress Notes (Signed)
Date and time results received: 04/02/22 1754 (use smartphrase ".now" to insert current time)  Test: Blood gas Critical Value: PCO2 86  Name of Provider Notified: Dr. Dyann Kief   Orders Received - See Orders for details

## 2022-04-02 NOTE — Progress Notes (Signed)
Patient friend, Carron Brazen, took patients hearing aides (both) and glasses home with him. Patient is currently wearing her watch and clothes that are in patient belongings bag that came from 300 are in the room with patient currently.

## 2022-04-02 NOTE — Progress Notes (Signed)
Patient became more alert as night went on. Attempted to speak but it was incomprehensible. Noted to adjust self in bed after this, in and out of sleep. No verbal or non-verbal indications of pain or discomfort, will continue to monitor.

## 2022-04-03 DIAGNOSIS — E870 Hyperosmolality and hypernatremia: Secondary | ICD-10-CM

## 2022-04-03 DIAGNOSIS — J9622 Acute and chronic respiratory failure with hypercapnia: Secondary | ICD-10-CM

## 2022-04-03 DIAGNOSIS — J9621 Acute and chronic respiratory failure with hypoxia: Secondary | ICD-10-CM

## 2022-04-03 DIAGNOSIS — E876 Hypokalemia: Secondary | ICD-10-CM

## 2022-04-03 LAB — BLOOD GAS, ARTERIAL
Acid-Base Excess: 18.2 mmol/L — ABNORMAL HIGH (ref 0.0–2.0)
Acid-Base Excess: 19.7 mmol/L — ABNORMAL HIGH (ref 0.0–2.0)
Bicarbonate: 46.7 mmol/L — ABNORMAL HIGH (ref 20.0–28.0)
Bicarbonate: 49.7 mmol/L — ABNORMAL HIGH (ref 20.0–28.0)
Drawn by: 31996
FIO2: 45 %
O2 Saturation: 88.8 %
O2 Saturation: 91.8 %
Patient temperature: 37
Patient temperature: 37.1
pCO2 arterial: 72 mmHg (ref 32–48)
pCO2 arterial: 84 mmHg (ref 32–48)
pH, Arterial: 7.38 (ref 7.35–7.45)
pH, Arterial: 7.42 (ref 7.35–7.45)
pO2, Arterial: 58 mmHg — ABNORMAL LOW (ref 83–108)
pO2, Arterial: 60 mmHg — ABNORMAL LOW (ref 83–108)

## 2022-04-03 LAB — CBC
HCT: 42.8 % (ref 36.0–46.0)
Hemoglobin: 12.9 g/dL (ref 12.0–15.0)
MCH: 30.9 pg (ref 26.0–34.0)
MCHC: 30.1 g/dL (ref 30.0–36.0)
MCV: 102.4 fL — ABNORMAL HIGH (ref 80.0–100.0)
Platelets: 328 10*3/uL (ref 150–400)
RBC: 4.18 MIL/uL (ref 3.87–5.11)
RDW: 14.7 % (ref 11.5–15.5)
WBC: 21.4 10*3/uL — ABNORMAL HIGH (ref 4.0–10.5)
nRBC: 0.1 % (ref 0.0–0.2)

## 2022-04-03 LAB — BASIC METABOLIC PANEL
Anion gap: 13 (ref 5–15)
BUN: 105 mg/dL — ABNORMAL HIGH (ref 8–23)
CO2: 40 mmol/L — ABNORMAL HIGH (ref 22–32)
Calcium: 9.7 mg/dL (ref 8.9–10.3)
Chloride: 101 mmol/L (ref 98–111)
Creatinine, Ser: 2.21 mg/dL — ABNORMAL HIGH (ref 0.44–1.00)
GFR, Estimated: 20 mL/min — ABNORMAL LOW (ref >60)
Glucose, Bld: 122 mg/dL — ABNORMAL HIGH (ref 70–99)
Potassium: 4.3 mmol/L (ref 3.5–5.1)
Sodium: 154 mmol/L — ABNORMAL HIGH (ref 135–145)

## 2022-04-03 MED ORDER — ORAL CARE MOUTH RINSE
15.0000 mL | OROMUCOSAL | Status: DC
  Administered 2022-04-03 – 2022-04-04 (×6): 15 mL via OROMUCOSAL

## 2022-04-03 MED ORDER — SODIUM CHLORIDE 0.9 % IV SOLN: INTRAVENOUS | Status: DC

## 2022-04-03 MED ORDER — DEXTROSE-NACL 5-0.45 % IV SOLN: INTRAVENOUS | Status: DC

## 2022-04-03 MED ORDER — ORAL CARE MOUTH RINSE: 15.0000 mL | OROMUCOSAL | Status: DC | PRN

## 2022-04-03 NOTE — Plan of Care (Signed)

## 2022-04-03 NOTE — Progress Notes (Signed)
Progress Note   Patient: [name] KDT:[id number] DOB: 09/08/1924 DOA: 03/07/2022     7 DOS: the patient was seen and examined on 04/03/2022   Brief hospital course: As per H&P written by Dr. Alcario Drought on 03/25/2022 [name] is a 89 y.o. female with medical history significant of HFrEF, PAF on eliquis, SSS s/p PPM.  Pt on PRN O2 at home.   Pt with SOB, productive cough, generalized weakness over past couple of days.  Worsening.  She states she has been the doctor several times since beginning November for similar symptoms and has received several rounds of antibiotics as well as a round of prednisone.  She states she got better but then got worse again over the last few days.  She has not had any fevers or chills, no known sick contacts.   Patient has a history of COPD and CHF. Reports she wears 2 L oxygen at home for prn use. Since last week she has been currently using her oxygen continuously. Denies any chest pain, hemoptysis, N/V/D/C, urinary symptoms.  She endorses periodic symmetric lower extremity swelling that is not worse today, and improves with her Lasix.  Assessment and Plan: * Acute on chronic combined systolic and diastolic CHF (congestive heart failure) (HCC) Continue low-sodium diet, strict intake and output and daily weights. -Continue IV diuresis; based on patient improvement in response will transition to once a day dosage (using only '20mg'$  daily). -Continue to wean oxygen supplementation as tolerated -2D echo demonstrating no significant changes in comparison to prior echo reports with ejection fraction 99-83%, grade 1 diastolic dysfunction and global hypokinesis.   -Continue hydralazine, Imdur and carvedilol -Patient denies chest pain. -Cardiology service will be involved for further recommendation regarding adjustment in patient's medication therapy. -Patient is currently 4.5 pounds negative since admission. - approximately 3 L negative since admission.  CKD  (chronic kidney disease) stage 3, GFR 30-59 ml/min (HCC) -Overall appears stable and at her baseline -Continue to follow trend intermittently while receiving diuresis.  Paroxysmal A-fib (HCC) -Continue telemetry monitoring -Continue Eliquis for secondary prevention -Continue carvedilol for rate management. -If patient able to take p.o.'s medications.  Benign essential HTN -Stable and well-controlled -Continue current antihypertensive agents if able to take by mouth. -As needed IV antihypertensive agents can be provided if required.  Hypokalemia -In the setting of diuresis. -latest Potassium 3.8; continue to follow electrolytes intermittently.  -further replete as needed.  Hypothyroidism: -Continue Synthroid when able to take p.o.'s.Marland Kitchen  Dysuria/UTI -Urinalysis suggesting the presence of UTI -Patient advised to maintain adequate hydration. -Started empirically on Rocephin; following culture results antibiotic has been adjusted to the use of cefepime. -continue to follow response.  Weakness/physical deconditioning -Prior to admission was living independently; family concern of his ability to perform activities of daily living in current condition. -Physical therapy evaluation has been requested and TOC has been made aware.  Sundowning/hospital-acquired delirium/metabolic encephalopathy due to UTI. -Minimize sedative agents -Continue low-dose Seroquel at bedtime  -Continue constant reorientation. -Following urine cultures, Enterobacter microorganism isolated; antibiotics adjusted for better coverage using cefepime. -in the setting of decline and not eating elective care service was consulted; goals of care discussed and decision made not to further escalate treatment and to allow time for improvement.  No artificial feedings, no intubation, no resuscitation. -Concern for potential CO2 retention has been brought up; ABG will be done to assess for abnormalities.  Hypercapnia -CO2  up to 86 -Slow responder after about 12 hours of BiPAP; CO2 is still  in 72 range -After discussing with family we will recommend continue BiPAP with intention to get levels into the 60s to see if will make any difference in patient's current condition.  Hypernatremia -Diuretics has been completely stopped but she is now experiencing dehydration. -D5 half-normal saline judicious fluid resuscitation will be provided. -Follow electrolytes.  Subjective:  Patient continued to be somnolent/obtunded.  Has developed hypercapnia (most likely in the setting of high use oxygen supplementation); there is no chest pain, no nausea, no vomiting.  Elevated high sodium appreciated and now having also signs of dehydration from lack of oral intake.   Physical Exam: Vitals:   04/03/22 1100 04/03/22 1150 04/03/22 1200 04/03/22 1300  BP: (!) 122/42  (!) 152/35 (!) 143/34  Pulse: 73 70 70 70  Resp: (!) '22 15 18 18  '$ Temp:      TempSrc:      SpO2: 97% 100% 100% 100%  Weight:      Height:       General exam: Obtunded/somnolent; barely open her eyes to voice commands.  No eating or not drinking and unable to take oral meds. Respiratory system: No wheezing or crackles; positive intermittent tachypnea and requiring high amount of oxygen supplementation. Cardiovascular system: Positive murmur.  No rubs, no gallops, no JVD. Gastrointestinal system: Abdomen is nondistended, soft and nontender. No organomegaly or masses felt. Normal bowel sounds heard. Central nervous system: Unable to assess with current encephalopathic process. Extremities: No cyanosis, clubbing or edema. Skin: No petechiae. Psychiatry: Unable to assess with current encephalopathic process.  Data Reviewed: Basic metabolic panel: Sodium 562, potassium 4.3, chloride 101, bicarb 40, BUN 105, creatinine 2.21 CBC: WBC 21.4, hemoglobin 12.9 and platelet count 328.  2D echo: 1. Left ventricular ejection fraction, by estimation, is 20 to 25%. The   left ventricle has severely decreased function. The left ventricle  demonstrates global hypokinesis. Left ventricular diastolic parameters are  consistent with Grade I diastolic  dysfunction (impaired relaxation).   2. Right ventricular systolic function is normal. The right ventricular  size is normal.   3. Left atrial size was mildly dilated.   4. Right atrial size was mildly dilated.   5. A small pericardial effusion is present.   6. The mitral valve is normal in structure. Trivial mitral valve  regurgitation.   7. The aortic valve is tricuspid. There is mild calcification of the  aortic valve. There is mild thickening of the aortic valve. Aortic valve  regurgitation is not visualized. Aortic valve sclerosis/calcification is  present, without any evidence of  aortic stenosis.   8. The inferior vena cava is dilated in size with >50% respiratory  variability, suggesting right atrial pressure of 8 mmHg.   Family Communication: Family updated over the phone.  Disposition: Status is: Inpatient Remains inpatient appropriate because: Continue IV diuresis.   Planned Discharge Destination: Home  Time spent: 45 minutes  Author: Barton Dubois, MD 04/03/2022 5:18 PM  For on call review [email or web address].

## 2022-04-03 NOTE — Progress Notes (Signed)
Patient trialled off bipap at this time. RT attempted HFNC, that was not successful due to patient breathing with mouth wide open.  Patient placed on NRB and patient is able to maintain SPO2 and comfort level.

## 2022-04-03 NOTE — Progress Notes (Signed)
Lab called with critical PCO2 of 72 will notify Dr. Dyann Kief

## 2022-04-04 DIAGNOSIS — E870 Hyperosmolality and hypernatremia: Secondary | ICD-10-CM

## 2022-04-04 DIAGNOSIS — J9621 Acute and chronic respiratory failure with hypoxia: Secondary | ICD-10-CM

## 2022-04-04 DIAGNOSIS — E876 Hypokalemia: Secondary | ICD-10-CM

## 2022-04-04 DIAGNOSIS — G9341 Metabolic encephalopathy: Secondary | ICD-10-CM

## 2022-04-04 DIAGNOSIS — N179 Acute kidney failure, unspecified: Secondary | ICD-10-CM

## 2022-04-04 DIAGNOSIS — N390 Urinary tract infection, site not specified: Secondary | ICD-10-CM

## 2022-04-04 DIAGNOSIS — J9622 Acute and chronic respiratory failure with hypercapnia: Secondary | ICD-10-CM

## 2022-04-04 LAB — CBC
HCT: 39.3 % (ref 36.0–46.0)
Hemoglobin: 11.5 g/dL — ABNORMAL LOW (ref 12.0–15.0)
MCH: 29.8 pg (ref 26.0–34.0)
MCHC: 29.3 g/dL — ABNORMAL LOW (ref 30.0–36.0)
MCV: 101.8 fL — ABNORMAL HIGH (ref 80.0–100.0)
Platelets: 251 10*3/uL (ref 150–400)
RBC: 3.86 MIL/uL — ABNORMAL LOW (ref 3.87–5.11)
RDW: 14.7 % (ref 11.5–15.5)
WBC: 25.2 10*3/uL — ABNORMAL HIGH (ref 4.0–10.5)
nRBC: 0.1 % (ref 0.0–0.2)

## 2022-04-04 LAB — BASIC METABOLIC PANEL
Anion gap: 11 (ref 5–15)
BUN: 128 mg/dL — ABNORMAL HIGH (ref 8–23)
CO2: 38 mmol/L — ABNORMAL HIGH (ref 22–32)
Calcium: 9.3 mg/dL (ref 8.9–10.3)
Chloride: 103 mmol/L (ref 98–111)
Creatinine, Ser: 2.5 mg/dL — ABNORMAL HIGH (ref 0.44–1.00)
GFR, Estimated: 17 mL/min — ABNORMAL LOW (ref >60)
Glucose, Bld: 222 mg/dL — ABNORMAL HIGH (ref 70–99)
Potassium: 3.7 mmol/L (ref 3.5–5.1)
Sodium: 152 mmol/L — ABNORMAL HIGH (ref 135–145)

## 2022-04-04 LAB — BLOOD GAS, ARTERIAL
Acid-Base Excess: 22.6 mmol/L — ABNORMAL HIGH (ref 0.0–2.0)
Bicarbonate: 51.4 mmol/L — ABNORMAL HIGH (ref 20.0–28.0)
O2 Saturation: 90.8 %
Patient temperature: 37
pCO2 arterial: 74 mmHg (ref 32–48)
pH, Arterial: 7.45 (ref 7.35–7.45)
pO2, Arterial: 58 mmHg — ABNORMAL LOW (ref 83–108)

## 2022-04-04 MED ORDER — HYDROMORPHONE HCL 1 MG/ML IJ SOLN
0.5000 mg | INTRAMUSCULAR | Status: DC | PRN
  Filled 2022-04-04: qty 0.5

## 2022-04-04 MED ORDER — MORPHINE SULFATE (PF) 2 MG/ML IV SOLN
1.0000 mg | INTRAVENOUS | Status: DC | PRN
  Administered 2022-04-04: 2 mg via INTRAVENOUS

## 2022-04-04 MED ORDER — GLYCOPYRROLATE 1 MG PO TABS: 1.0000 mg | ORAL_TABLET | ORAL | Status: DC | PRN

## 2022-04-04 MED ORDER — MORPHINE SULFATE (PF) 2 MG/ML IV SOLN
INTRAVENOUS | Status: AC
  Filled 2022-04-04: qty 1

## 2022-04-04 MED ORDER — BIOTENE DRY MOUTH MT LIQD: 15.0000 mL | OROMUCOSAL | Status: DC | PRN

## 2022-04-04 MED ORDER — POLYVINYL ALCOHOL 1.4 % OP SOLN: 1.0000 [drp] | Freq: Four times a day (QID) | OPHTHALMIC | Status: DC | PRN

## 2022-04-04 MED ORDER — LORAZEPAM 2 MG/ML PO CONC: 0.5000 mg | ORAL | Status: DC | PRN

## 2022-04-04 MED ORDER — GLYCOPYRROLATE 0.2 MG/ML IJ SOLN: 0.2000 mg | INTRAMUSCULAR | Status: DC | PRN

## 2022-04-04 MED ORDER — MORPHINE SULFATE (PF) 2 MG/ML IV SOLN: 2.0000 mg | INTRAVENOUS | Status: DC

## 2022-04-04 MED ORDER — HYDROMORPHONE HCL 1 MG/ML IJ SOLN
0.5000 mg | Freq: Two times a day (BID) | INTRAMUSCULAR | Status: DC | PRN
  Administered 2022-04-04: 0.5 mg via INTRAVENOUS

## 2022-04-05 ENCOUNTER — Other Ambulatory Visit: Payer: Self-pay | Admitting: *Deleted

## 2022-04-28 NOTE — Progress Notes (Signed)
Patient does wake up and follow with eyes. She retains her PCO2 in the high 60 to 70 range. If saturation drops while being on Nasal cannula , try venti mask at lowest setting to maintain saturation 88 to 90., Or place back on BiPAP.

## 2022-04-28 NOTE — Progress Notes (Signed)
Progress Note   Patient: [name] DGU:[id number] DOB: [birth year] DOA: 03/07/2022     8 DOS: the patient was seen and examined on 2022/05/03   Brief hospital course: As per H&P written by Dr. Alcario Drought on 03/22/2022 [name] is a 89 y.o. female with medical history significant of HFrEF, PAF on eliquis, SSS s/p PPM.  Pt on PRN O2 at home.   Pt with SOB, productive cough, generalized weakness over past couple of days.  Worsening.  She states she has been the doctor several times since beginning November for similar symptoms and has received several rounds of antibiotics as well as a round of prednisone.  She states she got better but then got worse again over the last few days.  She has not had any fevers or chills, no known sick contacts.   Patient has a history of COPD and CHF. Reports she wears 2 L oxygen at home for prn use. Since last week she has been currently using her oxygen continuously. Denies any chest pain, hemoptysis, N/V/D/C, urinary symptoms.  She endorses periodic symmetric lower extremity swelling that is not worse today, and improves with her Lasix.  Assessment and Plan: * Acute on chronic combined systolic and diastolic CHF (congestive heart failure) (HCC) -Holding on any further diuresis at this moment.  Patient had developed hyponatremia, elevation in her creatinine level and demonstrating dehydration from lack of oral intake. -After discussing with family members decision made to transition to full comfort care and symptomatic management only. -Overall prognosis is poor and limited; I will not be surprised if patient passed away while hospitalized. -Continue oxygen supplementation. -No further use of BiPAP. -Latest 2D echo demonstrating no significant changes in comparison to prior echo reports with ejection fraction 95-63%, grade 1 diastolic dysfunction and global hypokinesis.   -In the presence of inability taking by mouth we will discontinue hydralazine, Imdur and  carvedilol.  CKD (chronic kidney disease) stage 3, GFR 30-59 ml/min (HCC) -Acute kidney injury component in the presence of no eating and not drinking -At this moment transitioning to full comfort care and not pursuing any further blood work. -Continue symptomatic management.  Paroxysmal A-fib (HCC) -Rate controlled -Prior to admission using carvedilol and Eliquis for secondary prevention. -At this moment transitioning to full comfort care and symptomatic management -Any medications not intended to keep patient comfortable will be discontinued.  Benign essential HTN -Stable and well-controlled -At this moment plan to transition to full comfort care -Stopping antihypertensive agents.  Hypokalemia -In the setting of diuresis. -Repleted and latest potassium level 3.8 -No further blood works; patient transition to full comfort care.  Hypothyroidism: -Will stop Synthroid; plan is for comfort care only.  Dysuria/UTI -Urine cultures demonstrating Enterobacter infection -Patient adequately treated with the use of Rocephin and cefepime. -Despite antibiotic completion patient failed to mentally improve and decision made to transition to full comfort care.  Weakness/physical deconditioning -Prior to admission was living independently; family concern of her ability to perform activities of daily living in current condition. -After evaluation by physical therapy plan was for her to go to a skilled nursing facility at discharge for rehabilitation; unfortunately patient failed to improve and in fact further decline requiring to transition care to comfort management only.  Sundowning/hospital-acquired delirium/metabolic encephalopathy due to UTI. -Multifactorial; in the setting of sundowning, hospital-acquired delirium, encephalopathy driven by UTI and hypercapnia -Despite aggressive treatment for all of these problems and constant reorientation patient condition continued to decline and after  goals of care  discussion with family members and POA was decided to transition to full comfort care and symptomatic management only.    Hypercapnia -CO2 up to 86 -Slow responder after about 20 hours of BiPAP; CO2 remains elevated in 74 range and patient remains obtunded; with inability to perform any meaningful activity, eating or drinking. -No further use of BiPAP decided -Patient transition to full comfort care and symptomatic management only.  Hypernatremia -In the setting of dehydration from lack of oral intake -Diuretics will discontinue and fluids provided using D5 half-normal saline; sodium level improved on final blood work taken. -After further discussion family decided to transition to full comfort care. -No further blood work initiated -IV fluid discontinued.  Subjective:  Patient condition continued to be poor/deteriorated overall despite medical management.  After discussing with family decision made to discontinue BiPAP and transition to comfort care.  Patient is afebrile, no nausea, no vomiting, no chest pain.  Physical Exam: Vitals:   2022-04-08 0731 2022-04-08 0800 04/08/22 0817 04-08-2022 0900  BP:  (!) 133/24 (!) 133/24 (!) 134/35  Pulse: 80 73 77 79  Resp: 16 (!) 21  20  Temp: 97.6 F (36.4 C)     TempSrc: Axillary     SpO2: 90% (!) 85%  95%  Weight:      Height:       General exam: Remains obtunded/somnolent; barely open her eyes to voice commands.  No eating or not drinking and unable to take any oral meds. Respiratory system: No wheezing or crackles; positive intermittent tachypnea and requiring high amount of oxygen supplementation.  BiPAP in place at time of evaluation. Cardiovascular system: Positive murmur.  No rubs, no gallops, no JVD. Gastrointestinal system: Abdomen is nondistended, soft and nontender. No organomegaly or masses felt. Normal bowel sounds heard. Central nervous system: Unable to assess with current encephalopathic process. Extremities: No  cyanosis, clubbing or edema. Skin: No petechiae. Psychiatry: Improve to assess with current encephalopathic process.  Data Reviewed: Basic metabolic panel: Sodium 092, potassium 3.7, chloride 113, bicarb 38, blood glucose 222, BUN 128, creatinine 2.5 and GFR 17.  2D echo: 1. Left ventricular ejection fraction, by estimation, is 20 to 25%. The  left ventricle has severely decreased function. The left ventricle  demonstrates global hypokinesis. Left ventricular diastolic parameters are  consistent with Grade I diastolic  dysfunction (impaired relaxation).   2. Right ventricular systolic function is normal. The right ventricular  size is normal.   3. Left atrial size was mildly dilated.   4. Right atrial size was mildly dilated.   5. A small pericardial effusion is present.   6. The mitral valve is normal in structure. Trivial mitral valve  regurgitation.   7. The aortic valve is tricuspid. There is mild calcification of the  aortic valve. There is mild thickening of the aortic valve. Aortic valve  regurgitation is not visualized. Aortic valve sclerosis/calcification is  present, without any evidence of  aortic stenosis.   8. The inferior vena cava is dilated in size with >50% respiratory  variability, suggesting right atrial pressure of 8 mmHg.   Family Communication: Family updated over the phone.  Disposition: Status is: Inpatient Remains inpatient appropriate because: Continue inpatient care for symptomatic management at this point.  Hospice evaluation requested.   Planned Discharge Destination: Hospital death anticipated.  Time spent: 45 minutes  Author: Barton Dubois, MD 08-Apr-2022 10:27 AM  For on call review [email or web address].

## 2022-04-28 NOTE — Death Summary Note (Signed)
DEATH SUMMARY   Patient Details  Name: [name] MRN: [id number] DOB: 1924-11-17 RCB:[id number], [name] Reichmann, MD Admission/Discharge Information   Admit Date:  04-25-2022  Date of Death: Date of Death: 05/04/22  Time of Death: Time of Death: 8  Length of Stay: 8   Principle Cause of death: Acute on chronic respiratory failure with hypoxia/hypercapnia in the setting of CHF exacerbation.  Hospital Diagnoses: Principal Problem:   Acute on chronic combined systolic and diastolic CHF (congestive heart failure) (HCC) Active Problems:   CKD (chronic kidney disease) stage 3, GFR 30-59 ml/min (HCC)   Benign essential HTN   Paroxysmal A-fib (HCC)   Hypokalemia   Hospital Course: As per H&P written by Dr. Alcario Drought on April 25, 2022 [name] is a 89 y.o. female with medical history significant of HFrEF, PAF on eliquis, SSS s/p PPM.  Pt on PRN O2 at home.   Pt with SOB, productive cough, generalized weakness over past couple of days.  Worsening.  She states she has been the doctor several times since beginning November for similar symptoms and has received several rounds of antibiotics as well as a round of prednisone.  She states she got better but then got worse again over the last few days.  She has not had any fevers or chills, no known sick contacts.   Patient has a history of COPD and CHF. Reports she wears 2 L oxygen at home for prn use. Since last week she has been currently using her oxygen continuously. Denies any chest pain, hemoptysis, N/V/D/C, urinary symptoms.  She endorses periodic symmetric lower extremity swelling that is not worse today, and improves with her Lasix.  Assessment and Plan: * Acute on chronic combined systolic and diastolic CHF (congestive heart failure) (HCC) -Holding on any further diuresis at this moment.  Patient had developed hyponatremia, elevation in her creatinine level and demonstrating dehydration from lack of oral intake. -After discussing with family  members decision made to transition to full comfort care and symptomatic management only. -Overall prognosis is poor and limited; I will not be surprised if patient passed away while hospitalized. -Continue oxygen supplementation. -No further use of BiPAP. -Latest 2D echo demonstrating no significant changes in comparison to prior echo reports with ejection fraction 46-80%, grade 1 diastolic dysfunction and global hypokinesis.   -In the presence of inability taking by mouth we will discontinue hydralazine, Imdur and carvedilol.   CKD (chronic kidney disease) stage 3, GFR 30-59 ml/min (HCC) -Acute kidney injury component in the presence of no eating and not drinking -At this moment transitioning to full comfort care and not pursuing any further blood work. -Continue symptomatic management.   Paroxysmal A-fib (HCC) -Rate controlled -Prior to admission using carvedilol and Eliquis for secondary prevention. -At this moment transitioning to full comfort care and symptomatic management -Any medications not intended to keep patient comfortable will be discontinued.   Benign essential HTN -Stable and well-controlled -At this moment plan to transition to full comfort care -Stopping antihypertensive agents.   Hypokalemia -In the setting of diuresis. -Repleted and latest potassium level 3.8 -No further blood works; patient transition to full comfort care.   Hypothyroidism: -Will stop Synthroid; plan is for comfort care only.   Dysuria/UTI -Urine cultures demonstrating Enterobacter infection -Patient adequately treated with the use of Rocephin and cefepime. -Despite antibiotic completion patient failed to mentally improve and decision made to transition to full comfort care.   Weakness/physical deconditioning -Prior to admission was living independently; family concern of  her ability to perform activities of daily living in current condition. -After evaluation by physical therapy plan was  for her to go to a skilled nursing facility at discharge for rehabilitation; unfortunately patient failed to improve and in fact further decline requiring to transition care to comfort management only.   Sundowning/hospital-acquired delirium/metabolic encephalopathy due to UTI. -Multifactorial; in the setting of sundowning, hospital-acquired delirium, encephalopathy driven by UTI and hypercapnia -Despite aggressive treatment for all of these problems and constant reorientation patient condition continued to decline and after goals of care discussion with family members and POA was decided to transition to full comfort care and symptomatic management only.     Hypercapnia -CO2 up to 86 -Slow responder after about 20 hours of BiPAP; CO2 remains elevated in 74 range and patient remains obtunded; with inability to perform any meaningful activity, eating or drinking. -No further use of BiPAP decided -Patient transition to full comfort care and symptomatic management only.   Hypernatremia -In the setting of dehydration from lack of oral intake -Diuretics will discontinue and fluids provided using D5 half-normal saline; sodium level improved on final blood work taken. -After further discussion family decided to transition to full comfort care. -No further blood work initiated -IV fluid discontinued.  Once transition to full comfort care patient peacefully expired at 1310 on May 03, 2022.  Family at bedside.   Procedures: See below for x-ray reports.  Consultations: Palliative care, cardiology curbside, hospice.  The results of significant diagnostics from this hospitalization (including imaging, microbiology, ancillary and laboratory) are listed below for reference.   Significant Diagnostic Studies: DG CHEST PORT 1 VIEW  Result Date: 03/31/2022 CLINICAL DATA:  Hypoxia EXAM: PORTABLE CHEST 1 VIEW COMPARISON:  03/12/2022 FINDINGS: Cardiac shadow is mildly enlarged. Pacing device is again seen and  stable. Aortic calcifications are noted. Lungs are well aerated bilaterally. Mild chronic interstitial changes are again seen slightly accentuated by the technique. No focal infiltrate or effusion is noted. No bony abnormality is seen. IMPRESSION: Stable chronic interstitial changes. No new focal abnormality is noted. Electronically Signed   By: Inez Catalina M.D.   On: 03/31/2022 03:25   CT HEAD CODE STROKE WO CONTRAST`  Result Date: 03/30/2022 CLINICAL DATA:  Code stroke. Altered mental status, incoherent speech EXAM: CT HEAD WITHOUT CONTRAST TECHNIQUE: Contiguous axial images were obtained from the base of the skull through the vertex without intravenous contrast. RADIATION DOSE REDUCTION: This exam was performed according to the departmental dose-optimization program which includes automated exposure control, adjustment of the mA and/or kV according to patient size and/or use of iterative reconstruction technique. COMPARISON:  None Available. FINDINGS: Evaluation is somewhat limited by beam hardening artifact from the patient's calvarium brain: No evidence of acute infarction, hemorrhage, cerebral edema, mass, mass effect, or midline shift. No hydrocephalus or extra-axial collection. Vascular: No hyperdense vessel. Skull: Negative for fracture or focal lesion. Sinuses/Orbits: Mucosal thickening in the right maxillary sinus and bilateral ethmoid air cells and sphenoid sinuses. Status post bilateral lens replacements. Other: The mastoid air cells are well aerated. ASPECTS [facility] Program Early CT Score) - Ganglionic level infarction (caudate, lentiform nuclei, internal capsule, insula, M1-M3 cortex): 7 - Supraganglionic infarction (M4-M6 cortex): 3 Total score (0-10 with 10 being normal): 10 IMPRESSION: 1. No acute intracranial process. 2. ASPECTS is 10. Code stroke imaging results were communicated on 03/30/2022 at 2:26 pm to provider [facility] via secure text paging. Electronically Signed   By: Merilyn Baba M.D.   On: 03/30/2022 14:27  ECHOCARDIOGRAM COMPLETE  Result Date: 03/27/2022    ECHOCARDIOGRAM REPORT   Patient Name:   [name] Date of Exam: 03/27/2022 Medical Rec #:  [id number]     Height:       64.0 in Accession #:    [id number]    Weight:       98.3 lb Date of Birth:  02/15/1925      BSA:          1.447 m Patient Age:    89 years      BP:           160/47 mmHg Patient Gender: F             HR:           71 bpm. Exam Location:  [facility] Procedure: 2D Echo, Cardiac Doppler and Color Doppler Indications:    CHF-Acute Systolic B01.75  History:        Patient has prior history of Echocardiogram examinations, most                 recent 04/24/2021. CHF and Cardiomyopathy, CAD, Pacemaker,                 Arrythmias:Atrial Fibrillation; Risk Factors:Hypertension.                 History of sick sinus syndrome (From Hx).  Sonographer:    Alvino Chapel RCS Referring Phys: [place]  1. Left ventricular ejection fraction, by estimation, is 20 to 25%. The left ventricle has severely decreased function. The left ventricle demonstrates global hypokinesis. Left ventricular diastolic parameters are consistent with Grade I diastolic dysfunction (impaired relaxation).  2. Right ventricular systolic function is normal. The right ventricular size is normal.  3. Left atrial size was mildly dilated.  4. Right atrial size was mildly dilated.  5. A small pericardial effusion is present.  6. The mitral valve is normal in structure. Trivial mitral valve regurgitation.  7. The aortic valve is tricuspid. There is mild calcification of the aortic valve. There is mild thickening of the aortic valve. Aortic valve regurgitation is not visualized. Aortic valve sclerosis/calcification is present, without any evidence of aortic stenosis.  8. The inferior vena cava is dilated in size with >50% respiratory variability, suggesting right atrial pressure of 8 mmHg. Comparison(s): No significant change from  prior study. Prior images reviewed side by side. FINDINGS  Left Ventricle: Left ventricular ejection fraction, by estimation, is 20 to 25%. The left ventricle has severely decreased function. The left ventricle demonstrates global hypokinesis. The left ventricular internal cavity size was normal in size. There is no left ventricular hypertrophy. Abnormal (paradoxical) septal motion, consistent with RV pacemaker. Left ventricular diastolic function could not be evaluated due to atrial fibrillation. Left ventricular diastolic parameters are consistent with Grade  I diastolic dysfunction (impaired relaxation). Normal left ventricular filling pressure. Right Ventricle: The right ventricular size is normal. No increase in right ventricular wall thickness. Right ventricular systolic function is normal. Left Atrium: Left atrial size was mildly dilated. Right Atrium: Right atrial size was mildly dilated. Pericardium: A small pericardial effusion is present. Mitral Valve: The mitral valve is normal in structure. Mild mitral annular calcification. Trivial mitral valve regurgitation. Tricuspid Valve: The tricuspid valve is normal in structure. Tricuspid valve regurgitation is trivial. Aortic Valve: The aortic valve is tricuspid. There is mild calcification of the aortic valve. There is mild thickening of the aortic valve. Aortic valve regurgitation  is not visualized. Aortic valve sclerosis/calcification is present, without any evidence of aortic stenosis. Pulmonic Valve: The pulmonic valve was normal in structure. Pulmonic valve regurgitation is not visualized. Aorta: The aortic root and ascending aorta are structurally normal, with no evidence of dilitation. Venous: The inferior vena cava is dilated in size with greater than 50% respiratory variability, suggesting right atrial pressure of 8 mmHg. IAS/Shunts: No atrial level shunt detected by color flow Doppler. Additional Comments: A device lead is visualized in the right  ventricle.  LEFT VENTRICLE PLAX 2D LVIDd:         5.35 cm      Diastology LVIDs:         4.10 cm      LV e' medial:    3.48 cm/s LV PW:         1.00 cm      LV E/e' medial:  18.3 LV IVS:        1.10 cm      LV e' lateral:   5.22 cm/s LVOT diam:     1.90 cm      LV E/e' lateral: 12.2 LV SV:         62 LV SV Index:   43 LVOT Area:     2.84 cm  LV Volumes (MOD) LV vol d, MOD A2C: 65.5 ml LV vol d, MOD A4C: 133.0 ml LV vol s, MOD A2C: 44.5 ml LV vol s, MOD A4C: 81.6 ml LV SV MOD A2C:     21.0 ml LV SV MOD A4C:     133.0 ml LV SV MOD BP:      34.7 ml RIGHT VENTRICLE RV S prime:     12.50 cm/s TAPSE (M-mode): 2.1 cm LEFT ATRIUM             Index        RIGHT ATRIUM           Index LA diam:        3.20 cm 2.21 cm/m   RA Area:     18.30 cm LA Vol (A2C):   30.4 ml 21.01 ml/m  RA Volume:   53.80 ml  37.19 ml/m LA Vol (A4C):   75.8 ml 52.39 ml/m LA Biplane Vol: 48.7 ml 33.66 ml/m  AORTIC VALVE LVOT Vmax:   99.90 cm/s LVOT Vmean:  68.100 cm/s LVOT VTI:    0.218 m  AORTA Ao Root diam: 3.00 cm MITRAL VALVE MV Area (PHT): 3.27 cm     SHUNTS MV Decel Time: 232 msec     Systemic VTI:  0.22 m MV E velocity: 63.80 cm/s   Systemic Diam: 1.90 cm MV A velocity: 113.00 cm/s MV E/A ratio:  0.56 Mihai Croitoru MD Electronically signed by Sanda Klein MD Signature Date/Time: 03/27/2022/1:39:12 PM    Final    DG Chest 2 View  Result Date: 03/14/2022 CLINICAL DATA:  Shortness of breath and cough. EXAM: CHEST - 2 VIEW COMPARISON:  Chest two views 04/26/2021 FINDINGS: Left chest wall cardiac pacer is seen with leads overlying the right atrium and right ventricle, similar to prior. Cardiac silhouette is again moderately enlarged. Mediastinal contours are within normal limits. Moderate to high-grade atherosclerotic calcifications within the aortic arch. Flattening of the diaphragms and moderate hyperinflation, unchanged. Mild bilateral chronic interstitial thickening is unchanged from 04/26/2021. This is slightly increased from  03/25/2021 and 08/2020. Tiny bilateral pleural effusions are decreased from 04/26/2021. No pneumothorax. Moderate multilevel degenerative disc changes of the thoracic spine.  Diffuse decreased bone mineralization. IMPRESSION: 1. Cardiomegaly. 2. Mild bilateral chronic interstitial thickening, unchanged from 04/26/2021 but increased from 03/25/2021. This may represent mild interstitial pulmonary edema. 3. Tiny bilateral pleural effusions, decreased from 04/26/2021. Electronically Signed   By: Yvonne Kendall M.D.   On: 03/17/2022 14:58    Microbiology: Recent Results (from the past 240 hour(s))  Resp panel by RT-PCR (RSV, Flu A&B, Covid) Anterior Nasal Swab     Status: None   Collection Time: 03/05/2022  3:05 PM   Specimen: Anterior Nasal Swab  Result Value Ref Range Status   SARS Coronavirus 2 by RT PCR NEGATIVE NEGATIVE Final    Comment: (NOTE) SARS-CoV-2 target nucleic acids are NOT DETECTED.  The SARS-CoV-2 RNA is generally detectable in upper respiratory specimens during the acute phase of infection. The lowest concentration of SARS-CoV-2 viral copies this assay can detect is 138 copies/mL. A negative result does not preclude SARS-Cov-2 infection and should not be used as the sole basis for treatment or other patient management decisions. A negative result may occur with  improper specimen collection/handling, submission of specimen other than nasopharyngeal swab, presence of viral mutation(s) within the areas targeted by this assay, and inadequate number of viral copies(<138 copies/mL). A negative result must be combined with clinical observations, patient history, and epidemiological information. The expected result is Negative.  Fact Sheet for Patients:  EntrepreneurPulse.com.au  Fact Sheet for Healthcare Providers:  IncredibleEmployment.be  This test is no t yet approved or cleared by the [place] FDA and  has been authorized for detection  and/or diagnosis of SARS-CoV-2 by FDA under an Emergency Use Authorization (EUA). This EUA will remain  in effect (meaning this test can be used) for the duration of the COVID-19 declaration under Section 564(b)(1) of the Act, 21 U.S.C.section 360bbb-3(b)(1), unless the authorization is terminated  or revoked sooner.       Influenza A by PCR NEGATIVE NEGATIVE Final   Influenza B by PCR NEGATIVE NEGATIVE Final    Comment: (NOTE) The Xpert Xpress SARS-CoV-2/FLU/RSV plus assay is intended as an aid in the diagnosis of influenza from Nasopharyngeal swab specimens and should not be used as a sole basis for treatment. Nasal washings and aspirates are unacceptable for Xpert Xpress SARS-CoV-2/FLU/RSV testing.  Fact Sheet for Patients: EntrepreneurPulse.com.au  Fact Sheet for Healthcare Providers: IncredibleEmployment.be  This test is not yet approved or cleared by the [place] FDA and has been authorized for detection and/or diagnosis of SARS-CoV-2 by FDA under an Emergency Use Authorization (EUA). This EUA will remain in effect (meaning this test can be used) for the duration of the COVID-19 declaration under Section 564(b)(1) of the Act, 21 U.S.C. section 360bbb-3(b)(1), unless the authorization is terminated or revoked.     Resp Syncytial Virus by PCR NEGATIVE NEGATIVE Final    Comment: (NOTE) Fact Sheet for Patients: EntrepreneurPulse.com.au  Fact Sheet for Healthcare Providers: IncredibleEmployment.be  This test is not yet approved or cleared by the [place] FDA and has been authorized for detection and/or diagnosis of SARS-CoV-2 by FDA under an Emergency Use Authorization (EUA). This EUA will remain in effect (meaning this test can be used) for the duration of the COVID-19 declaration under Section 564(b)(1) of the Act, 21 U.S.C. section 360bbb-3(b)(1), unless the authorization is terminated  or revoked.  Performed at [facility], [address], [place], [place] [address]   Urine Culture     Status: Abnormal   Collection Time: 03/29/22  2:19 PM   Specimen: Urine,  Clean Catch  Result Value Ref Range Status   Specimen Description   Final    URINE, CLEAN CATCH Performed at [facility], [address] [place], [place] [address]    Special Requests   Final    NONE Performed at [facility], [address], [place], [place] [address]    Culture >=100,000 COLONIES/mL ENTEROBACTER CLOACAE (A)  Final   Report Status 04/01/2022 FINAL  Final   Organism ID, Bacteria ENTEROBACTER CLOACAE (A)  Final      Susceptibility   Enterobacter cloacae - MIC*    CEFAZOLIN >=64 RESISTANT Resistant     CEFEPIME <=0.12 SENSITIVE Sensitive     CIPROFLOXACIN <=0.25 SENSITIVE Sensitive     GENTAMICIN <=1 SENSITIVE Sensitive     IMIPENEM 0.5 SENSITIVE Sensitive     NITROFURANTOIN 64 INTERMEDIATE Intermediate     TRIMETH/SULFA <=20 SENSITIVE Sensitive     PIP/TAZO <=4 SENSITIVE Sensitive     * >=100,000 COLONIES/mL ENTEROBACTER CLOACAE  MRSA Next Gen by PCR, Nasal     Status: None   Collection Time: 04/02/22  6:25 PM   Specimen: Nasal Mucosa; Nasal Swab  Result Value Ref Range Status   MRSA by PCR Next Gen NOT DETECTED NOT DETECTED Final    Comment: (NOTE) The GeneXpert MRSA Assay (FDA approved for NASAL specimens only), is one component of a comprehensive MRSA colonization surveillance program. It is not intended to diagnose MRSA infection nor to guide or monitor treatment for MRSA infections. Test performance is not FDA approved in patients less than 23 years old. Performed at [facility], [address], [place], [place] [address]     Time spent: 35 minutes  Signed: Barton Dubois, MD 07-Apr-2022

## 2022-04-28 NOTE — Progress Notes (Signed)
Time of death 50 verified by second RN Georgina Peer Duffy. Attending informed. Family remains at bedside.

## 2022-04-28 NOTE — Progress Notes (Signed)
Made initial visit today; referral from Physician to provide support at end of life. Ms. Venkatesh seemed very comfortable today and without signs of pain. She had a her life long friend present as well as her two great nephews. They engaged well in narratives around [place] and review of her life. They asked for prayer bedside and Chaplain provided this at the close of the visit. Family is coping well. Chaplain also called patient pastor, Rev. Morene Antu per family request to keep him abreast of the changes since his last visit. Will continue to follow in order to provide spiritual support and to assess for spiritual need.   Rev. Bennie Pierini, M.Div Chaplain

## 2022-04-28 NOTE — TOC Progression Note (Signed)
Transition of Care [facility]) - Progression Note    Patient Details  Name: [name] MRN: [id number] Date of Birth: 23-Jan-1925  Transition of Care [facility]) CM/SW Contact  Boneta Lucks, RN Phone Number: 04-16-22, 10:00 AM  Clinical Narrative:   TOC consulted for residential hospice. Referral sent to [facility], updated them, patient is comfort and checking bed status. TOC following.    Expected Discharge Plan: [place] Barriers to Discharge: Continued Medical Work up  Expected Discharge Plan and [place] Choice: [place] arrangements for the past 2 months: [place]: [facility]   Social Determinants of Health (SDOH) Interventions [place]: No Food Insecurity (03/24/2022)  Housing: Low Risk  (03/17/2022)  Transportation Needs: No Transportation Needs (03/04/2022)  Utilities: Not At Risk (03/11/2022)  Tobacco Use: Medium Risk (04/02/2022)    Readmission Risk Interventions    04/25/2021   12:12 PM  Readmission Risk Prevention Plan  Transportation Screening Complete  HRI or [place] Complete  Social Work Consult for [place] Planning/Counseling Complete  Palliative Care Screening Not Applicable  Medication Review [employer] photographer) Complete

## 2022-04-28 NOTE — Progress Notes (Signed)
PT Cancellation Note  Patient Details Name: [name] MRN: [id number] DOB: 1925-03-08   Cancelled Treatment:    Reason Eval/Treat Not Completed: Medical issues which prohibited therapy.  Patient transferred to a higher level of care and will need new PT consult to resume therapy when patient is medically stable.  Thank you.   8:29 AM, 04-05-22 Lonell Grandchild, MPT Physical Therapist with [facility] [phone] office [phone] mobile phone

## 2022-04-28 NOTE — Progress Notes (Signed)
Pt able to open eyes to voice and squeeze hand when prompted. Remains on bipap. Will continue to monitor.

## 2022-04-28 NOTE — Progress Notes (Signed)
Palliative: [name] is resting quietly in bed..  She appears acutely/chronically ill and very frail.  She does not respond in any meaningful way to voice or touch.  I do not believe that she can make her basic needs known.  Her nephew/HCPOA, [name], is present at bedside.    Family has elected residential hospice placement.  [name] has BiPAP in place, bedside nursing staff is present unburdening [name] from BiPAP.  We talk about residential hospice referral.  [name] shares that he anticipates hospice worker to come for evaluation prior to 12:30 today.  No questions at this time.  We talk about unburden [name] from treatments and medications that are painful or are not changing things.  [name] is agreeable to stop painful needlesticks to focus on comfort and dignity while here in the hospital.  He tells me that there are no other visitors anticipated.  Conference with attending, bedside nursing staff, transition of care team related to patient condition, needs, goals of care, disposition.  Plan: Comfort care.  End-of-life order set implemented. Prognosis: In-hospital death would not be surprising.    41 minutes  [name], NP Palliative medicine team Team phone [phone] Greater than 50% of this time was spent counseling and coordinating care related to the above assessment and plan.

## 2022-04-28 DEATH — deceased

## 2022-09-04 IMAGING — DX DG CHEST 2V
2 series · 2 of 2 positions shown · non-contrast
Comparison: July 31, 2017.

CLINICAL DATA: Shortness of breath.

EXAM:
CHEST - 2 VIEW

[chest pa]
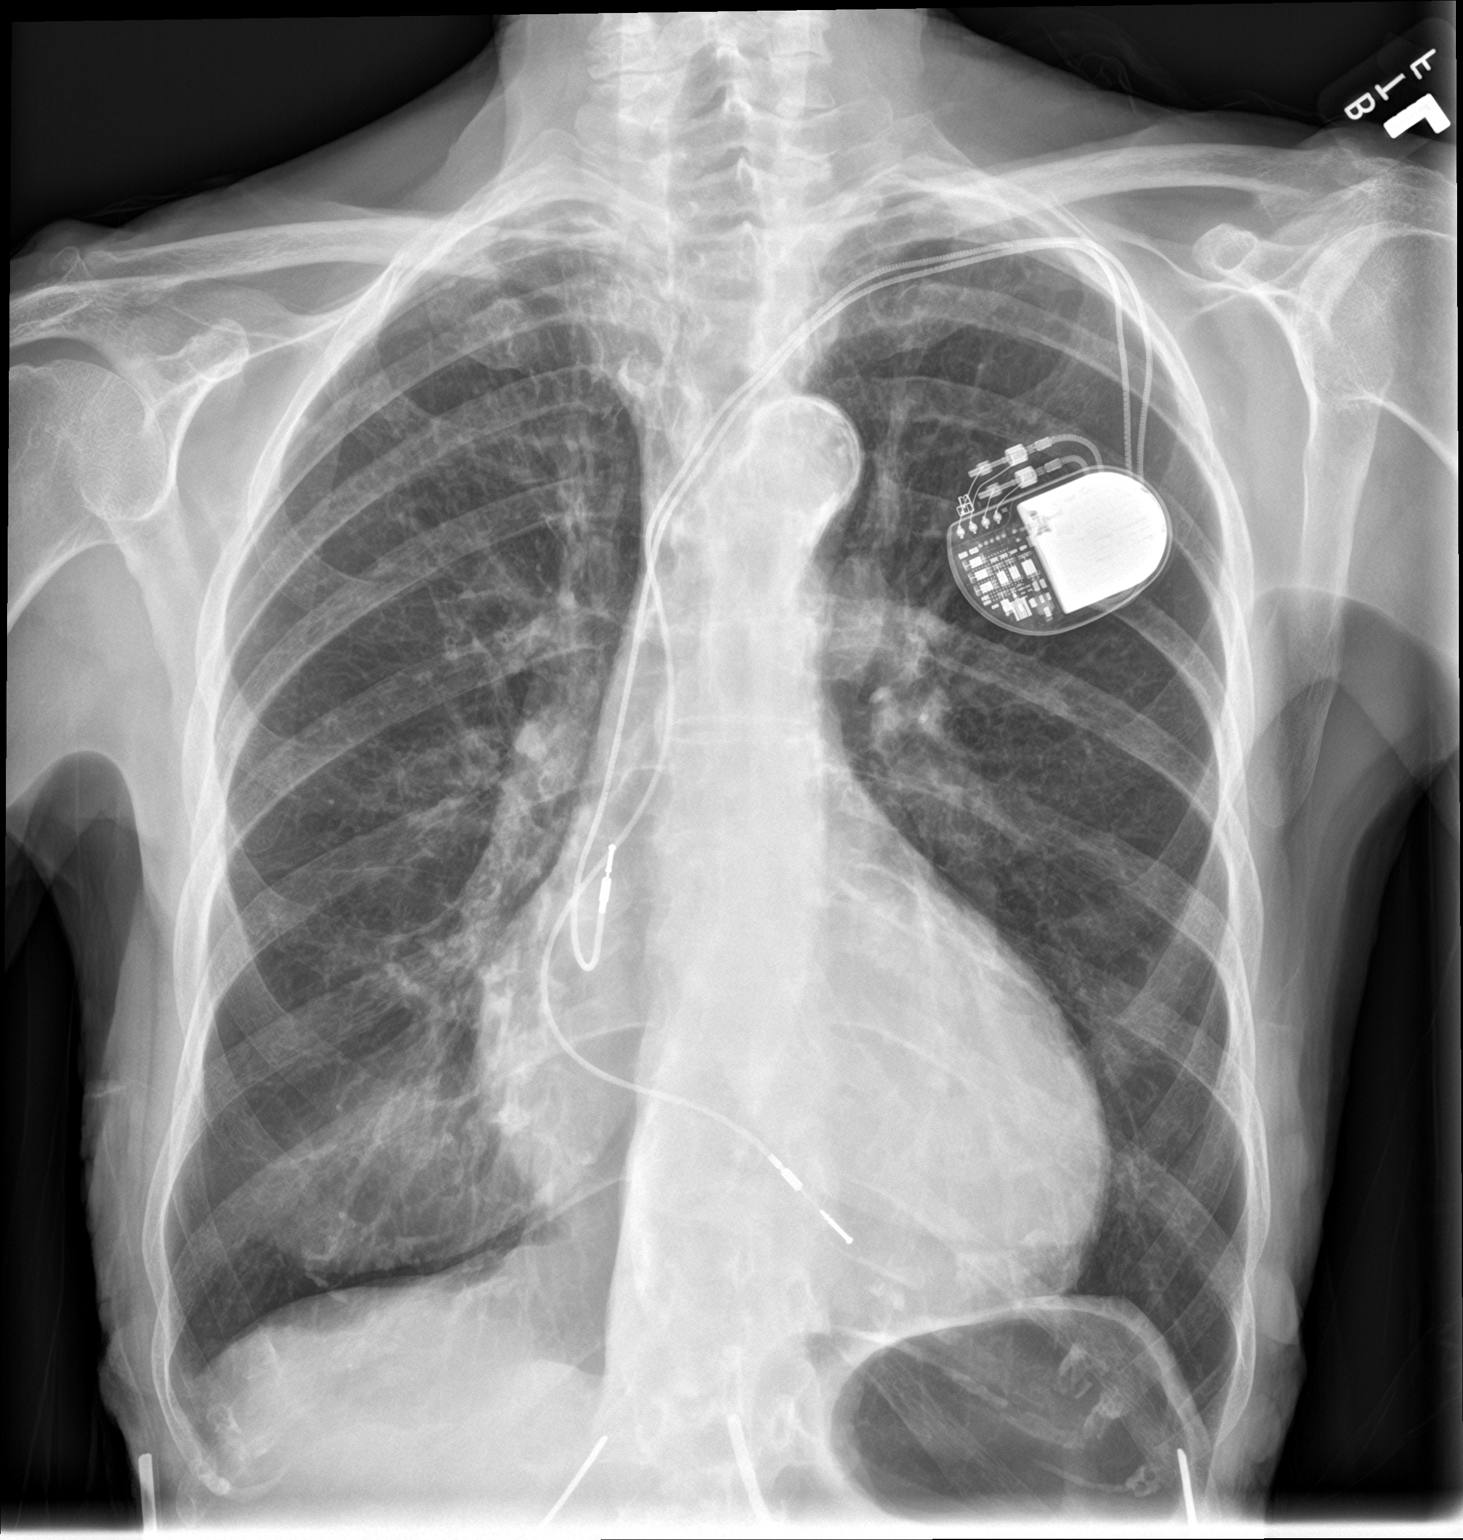

[chest lat]
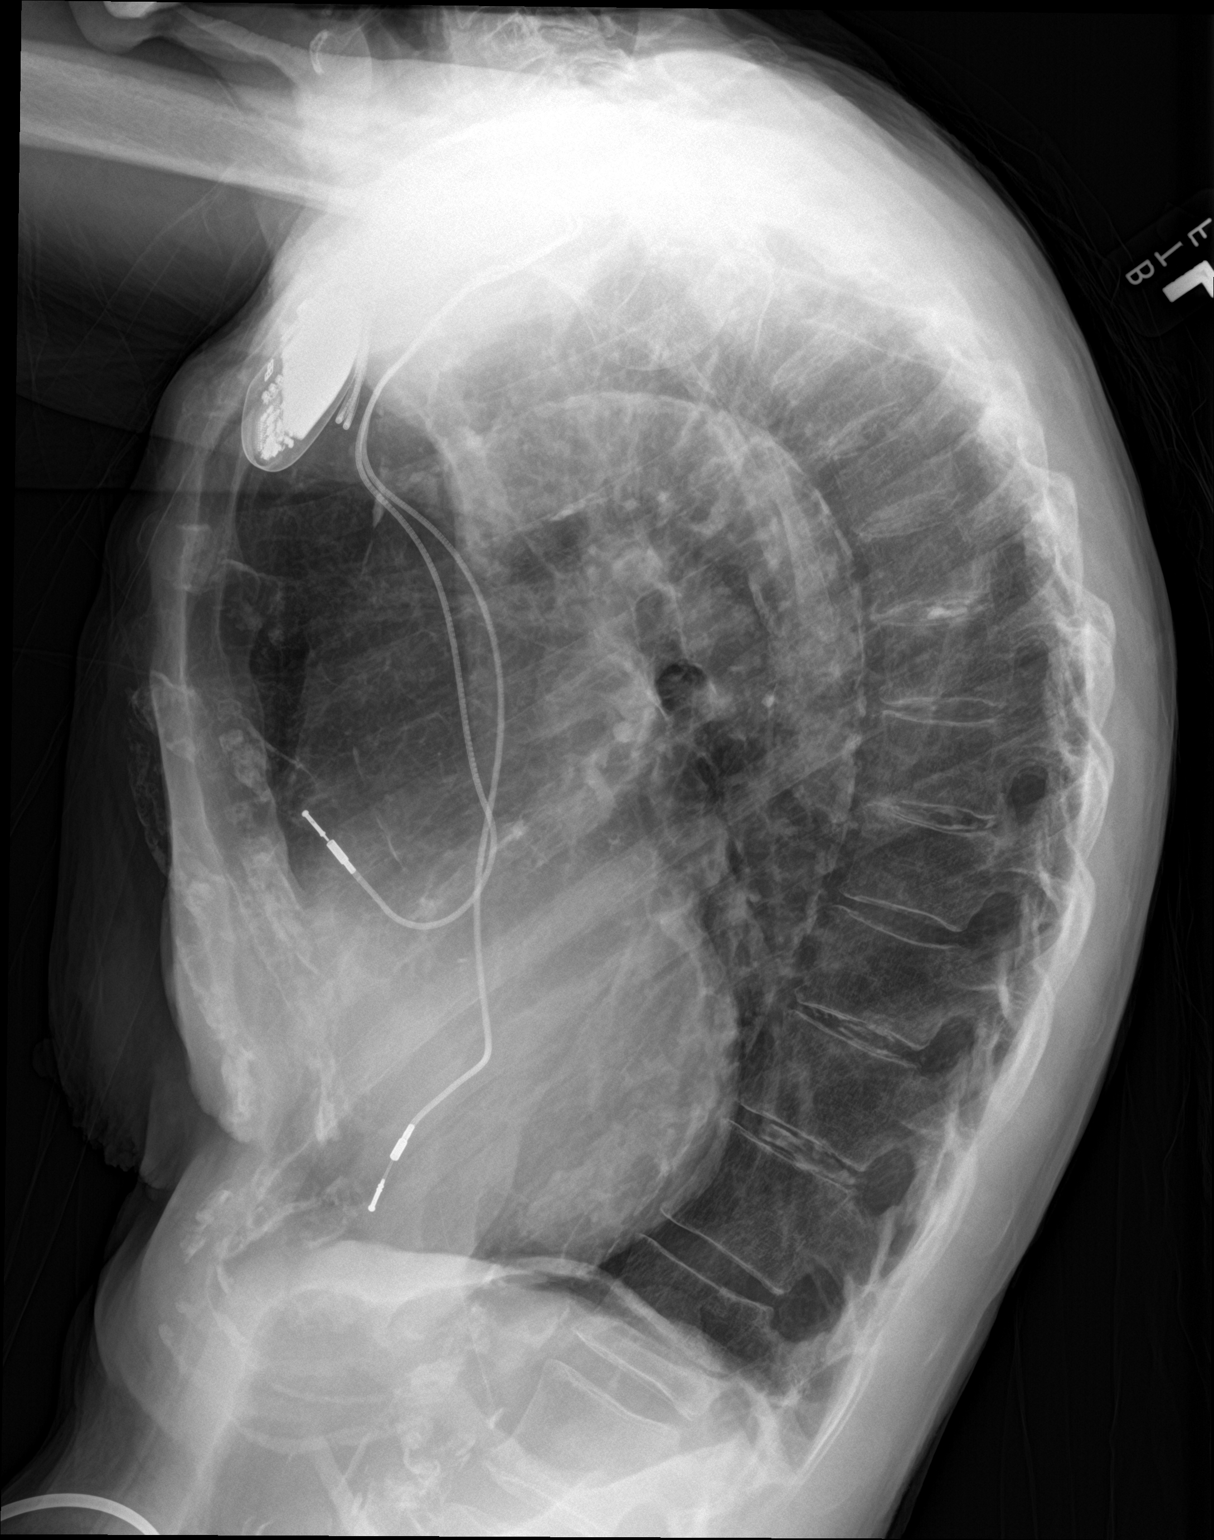

[2 of 2 positions shown; findings below may reference images not displayed]

FINDINGS: The heart size and mediastinal contours are within normal limits.
Both lungs are clear. Hyperinflation of the lungs is noted. No
pneumothorax or pleural effusion is noted. Stable position of
left-sided pacemaker. The visualized skeletal structures are
unremarkable.
IMPRESSION: No acute cardiopulmonary disease. Hyperexpansion of the lungs is
noted suggesting COPD.

Aortic Atherosclerosis ([id number]).
